# Patient Record
Sex: Female | Born: 1985 | Race: Black or African American | Hispanic: No | Smoking: Never smoker
Health system: Southern US, Community
[De-identification: ages and names within clinical notes are randomized; demographics above are authoritative.]

## PROBLEM LIST (undated history)

## (undated) ENCOUNTER — Inpatient Hospital Stay (HOSPITAL_COMMUNITY): Payer: Self-pay

## (undated) DIAGNOSIS — K219 Gastro-esophageal reflux disease without esophagitis: Secondary | ICD-10-CM

## (undated) DIAGNOSIS — D649 Anemia, unspecified: Secondary | ICD-10-CM

## (undated) DIAGNOSIS — F909 Attention-deficit hyperactivity disorder, unspecified type: Secondary | ICD-10-CM

## (undated) DIAGNOSIS — D4989 Neoplasm of unspecified behavior of other specified sites: Secondary | ICD-10-CM

## (undated) DIAGNOSIS — K5090 Crohn's disease, unspecified, without complications: Secondary | ICD-10-CM

## (undated) DIAGNOSIS — D219 Benign neoplasm of connective and other soft tissue, unspecified: Secondary | ICD-10-CM

## (undated) DIAGNOSIS — O24419 Gestational diabetes mellitus in pregnancy, unspecified control: Secondary | ICD-10-CM

## (undated) DIAGNOSIS — F419 Anxiety disorder, unspecified: Secondary | ICD-10-CM

## (undated) DIAGNOSIS — Z8619 Personal history of other infectious and parasitic diseases: Secondary | ICD-10-CM

## (undated) HISTORY — PX: CARDIAC SURGERY: SHX584

## (undated) HISTORY — DX: Personal history of other infectious and parasitic diseases: Z86.19

---
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
## 2007-03-01 ENCOUNTER — Emergency Department (HOSPITAL_COMMUNITY): Admission: EM | Admit: 2007-03-01 | Discharge: 2007-03-01 | Payer: Self-pay | Admitting: Emergency Medicine

## 2007-03-02 ENCOUNTER — Observation Stay (HOSPITAL_COMMUNITY): Admission: EM | Admit: 2007-03-02 | Discharge: 2007-03-03 | Payer: Self-pay | Admitting: Emergency Medicine

## 2007-03-12 ENCOUNTER — Emergency Department (HOSPITAL_COMMUNITY): Admission: EM | Admit: 2007-03-12 | Discharge: 2007-03-12 | Payer: Self-pay | Admitting: Family Medicine

## 2007-12-21 ENCOUNTER — Emergency Department (HOSPITAL_COMMUNITY): Admission: EM | Admit: 2007-12-21 | Discharge: 2007-12-21 | Payer: Self-pay | Admitting: Emergency Medicine

## 2008-01-06 ENCOUNTER — Inpatient Hospital Stay (HOSPITAL_COMMUNITY): Admission: AD | Admit: 2008-01-06 | Discharge: 2008-01-06 | Payer: Self-pay | Admitting: Family Medicine

## 2008-02-18 ENCOUNTER — Inpatient Hospital Stay (HOSPITAL_COMMUNITY): Admission: AD | Admit: 2008-02-18 | Discharge: 2008-02-18 | Payer: Self-pay | Admitting: Gynecology

## 2008-03-14 ENCOUNTER — Ambulatory Visit (HOSPITAL_COMMUNITY): Admission: RE | Admit: 2008-03-14 | Discharge: 2008-03-14 | Payer: Self-pay | Admitting: Obstetrics & Gynecology

## 2008-03-25 ENCOUNTER — Encounter: Admission: RE | Admit: 2008-03-25 | Discharge: 2008-03-25 | Payer: Self-pay | Admitting: Obstetrics & Gynecology

## 2008-03-25 ENCOUNTER — Ambulatory Visit: Payer: Self-pay | Admitting: Obstetrics & Gynecology

## 2008-03-26 ENCOUNTER — Ambulatory Visit: Payer: Self-pay | Admitting: Gynecology

## 2008-04-01 ENCOUNTER — Ambulatory Visit: Payer: Self-pay | Admitting: Obstetrics & Gynecology

## 2008-05-09 ENCOUNTER — Encounter: Payer: Self-pay | Admitting: Family Medicine

## 2008-05-09 ENCOUNTER — Ambulatory Visit: Payer: Self-pay | Admitting: Obstetrics & Gynecology

## 2008-05-09 LAB — CONVERTED CEMR LAB
Hemoglobin: 12.6 g/dL (ref 12.0–15.0)
Platelets: 248 10*3/uL (ref 150–400)
RBC: 3.71 M/uL — ABNORMAL LOW (ref 3.87–5.11)
RDW: 13 % (ref 11.5–15.5)
WBC: 6.3 10*3/uL (ref 4.0–10.5)

## 2008-05-27 ENCOUNTER — Encounter: Admission: RE | Admit: 2008-05-27 | Discharge: 2008-05-27 | Payer: Self-pay | Admitting: Obstetrics & Gynecology

## 2008-05-27 ENCOUNTER — Ambulatory Visit: Payer: Self-pay | Admitting: Obstetrics & Gynecology

## 2008-05-28 ENCOUNTER — Ambulatory Visit (HOSPITAL_COMMUNITY): Admission: RE | Admit: 2008-05-28 | Discharge: 2008-05-28 | Payer: Self-pay | Admitting: Obstetrics & Gynecology

## 2008-05-28 ENCOUNTER — Encounter: Payer: Self-pay | Admitting: Family Medicine

## 2008-06-17 ENCOUNTER — Encounter: Payer: Self-pay | Admitting: Family Medicine

## 2008-06-17 ENCOUNTER — Ambulatory Visit: Payer: Self-pay | Admitting: Obstetrics & Gynecology

## 2008-06-26 ENCOUNTER — Ambulatory Visit: Payer: Self-pay | Admitting: Family

## 2008-06-26 ENCOUNTER — Observation Stay (HOSPITAL_COMMUNITY): Admission: AD | Admit: 2008-06-26 | Discharge: 2008-06-27 | Payer: Self-pay | Admitting: Obstetrics & Gynecology

## 2008-07-13 ENCOUNTER — Inpatient Hospital Stay (HOSPITAL_COMMUNITY): Admission: AD | Admit: 2008-07-13 | Discharge: 2008-07-15 | Payer: Self-pay | Admitting: Obstetrics & Gynecology

## 2008-07-13 ENCOUNTER — Ambulatory Visit: Payer: Self-pay | Admitting: Obstetrics and Gynecology

## 2008-10-15 ENCOUNTER — Emergency Department (HOSPITAL_COMMUNITY): Admission: EM | Admit: 2008-10-15 | Discharge: 2008-10-15 | Payer: Self-pay | Admitting: *Deleted

## 2009-06-12 ENCOUNTER — Observation Stay (HOSPITAL_COMMUNITY): Admission: EM | Admit: 2009-06-12 | Discharge: 2009-06-12 | Payer: Self-pay | Admitting: Emergency Medicine

## 2009-06-19 IMAGING — US US OB FOLLOW-UP
1 series · 14 of 28 positions shown · non-contrast
Comparison: none

OBSTETRICAL ULTRASOUND:
 This ultrasound exam was performed in the [HOSPITAL] Ultrasound Department.  The OB US report was generated in the AS system, and faxed to the ordering physician.  This report is also available in [REDACTED] PACS.

[Series 1: us ob re-eval · 14 of 48 slices shown]
[im 2/48]
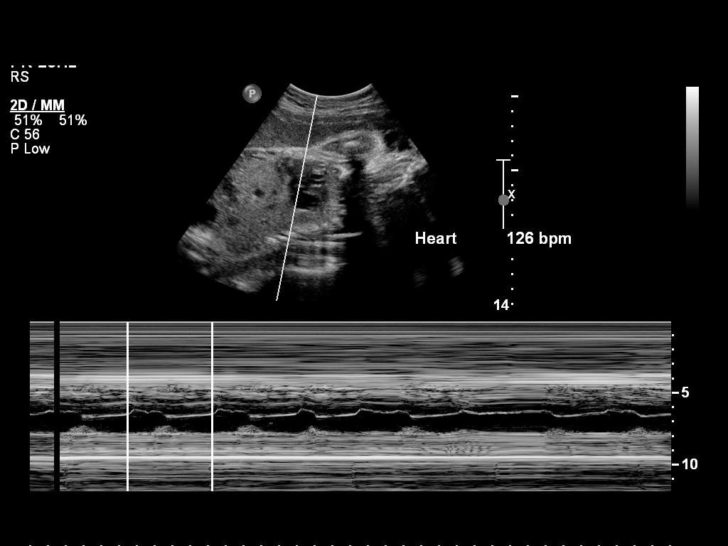
[im 6/48]
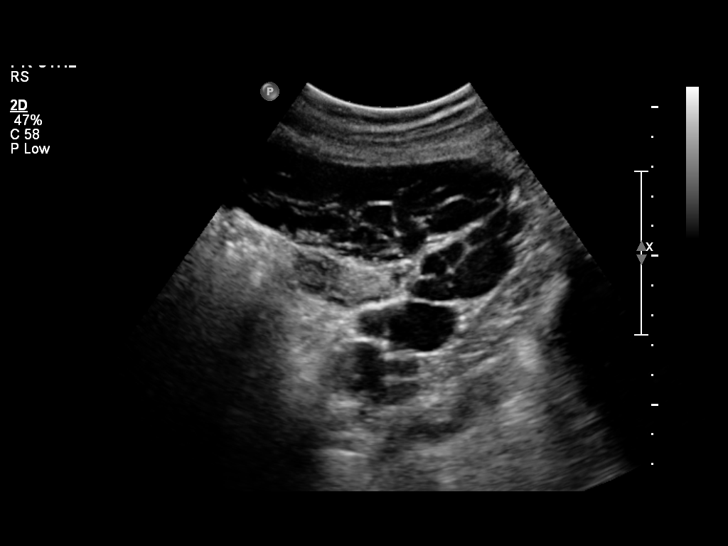
[im 9/48]
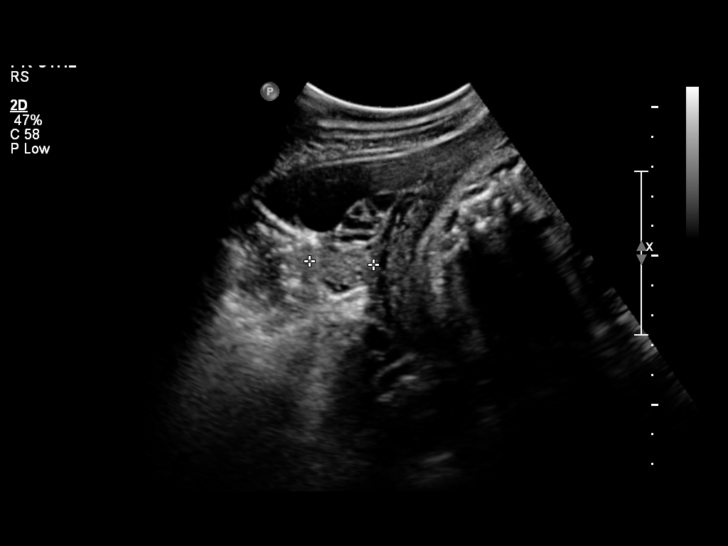
[im 13/48]
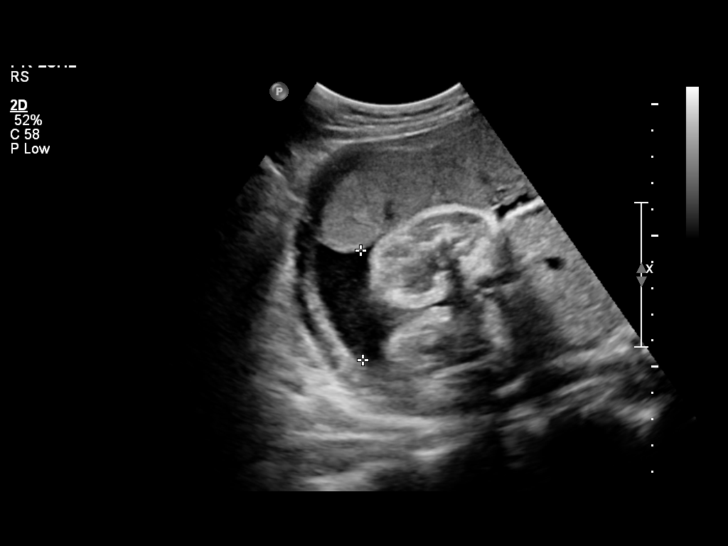
[im 16/48]
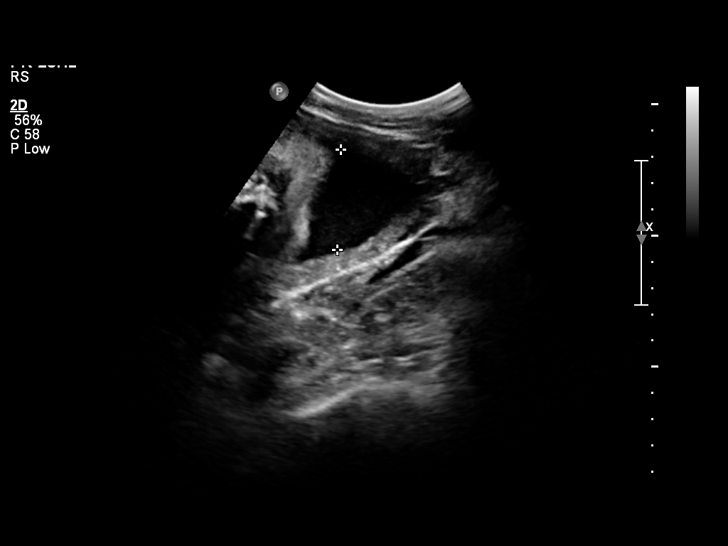
[im 20/48]
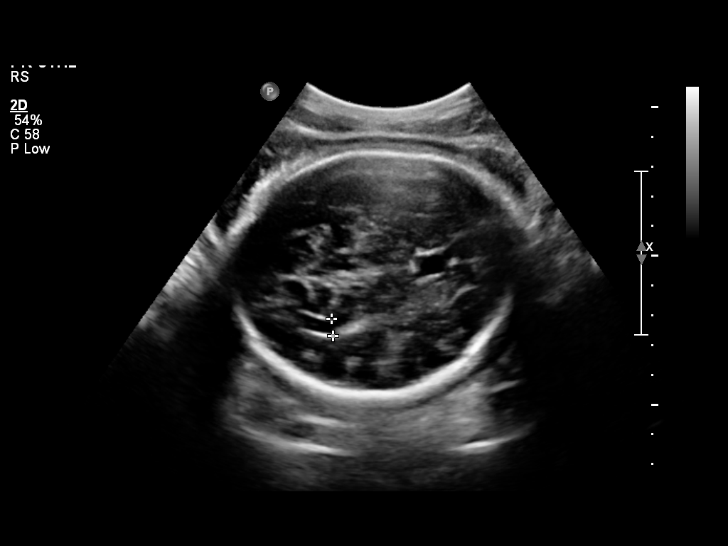
[im 23/48]
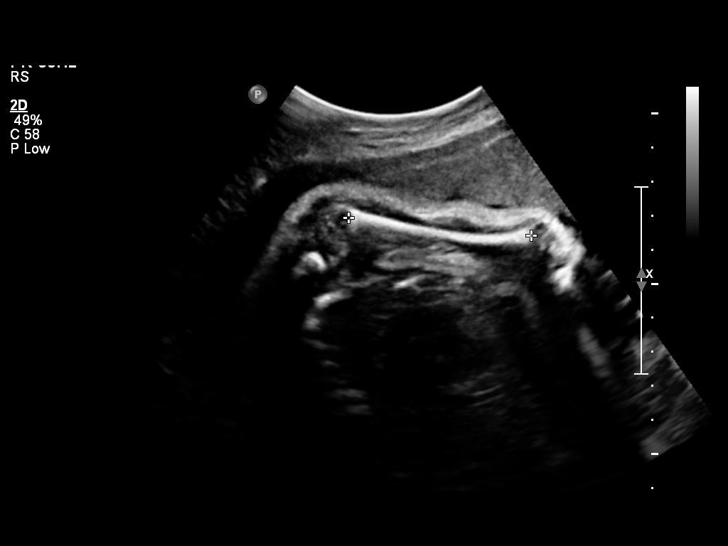
[im 27/48]
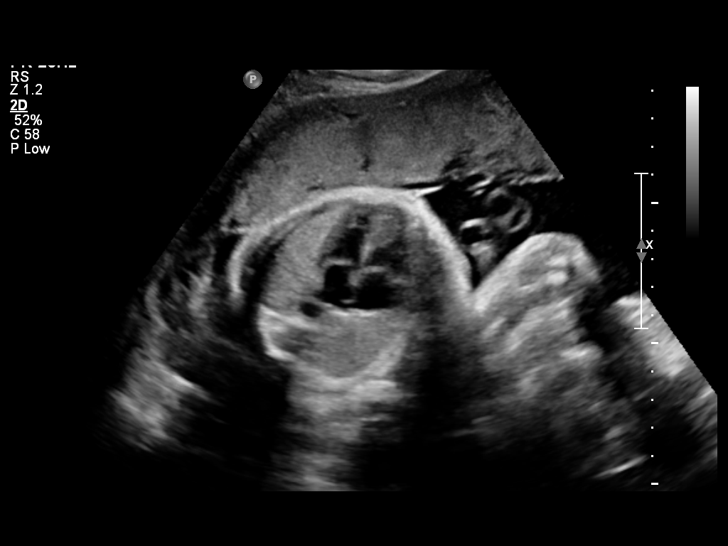
[im 30/48]
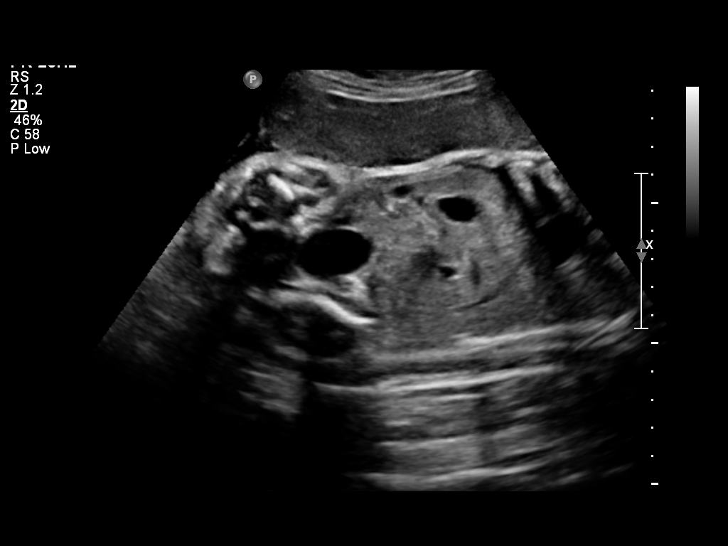
[im 34/48]
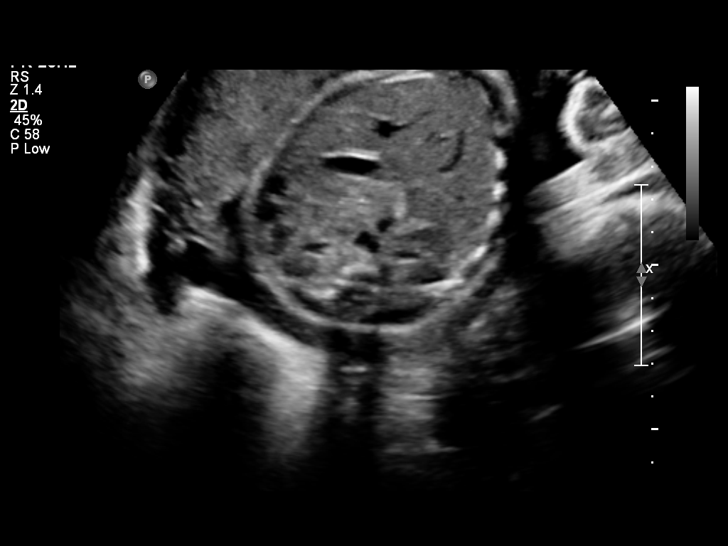
[im 37/48]
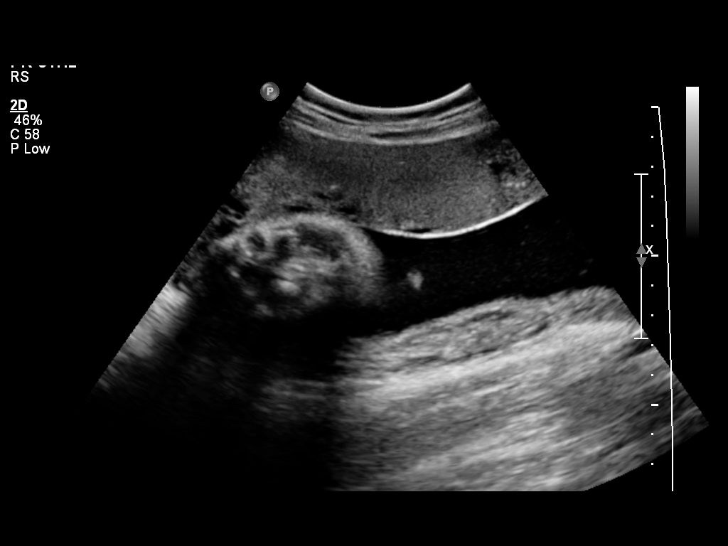
[im 41/48]
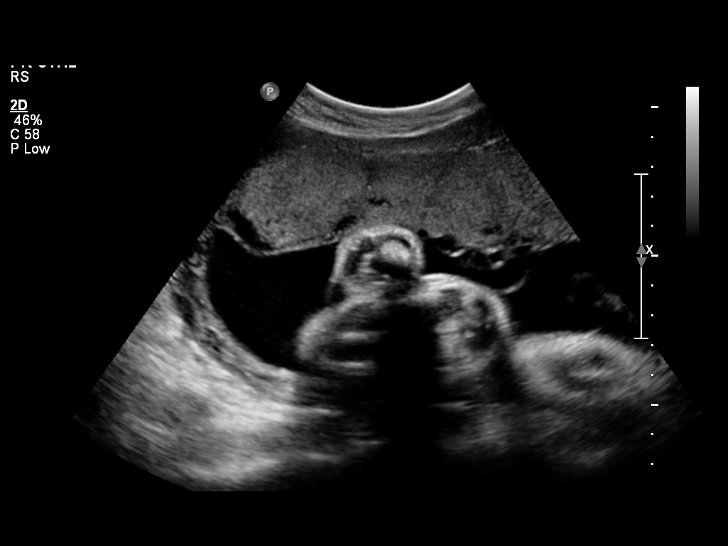
[im 44/48]
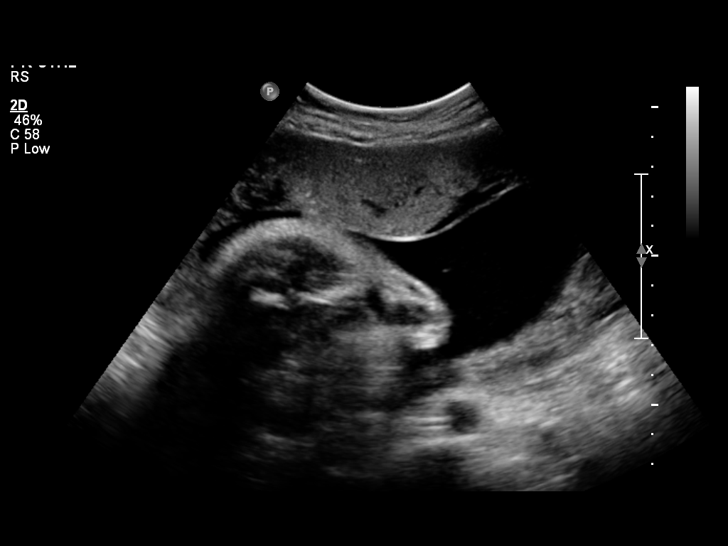
[im 48/48]
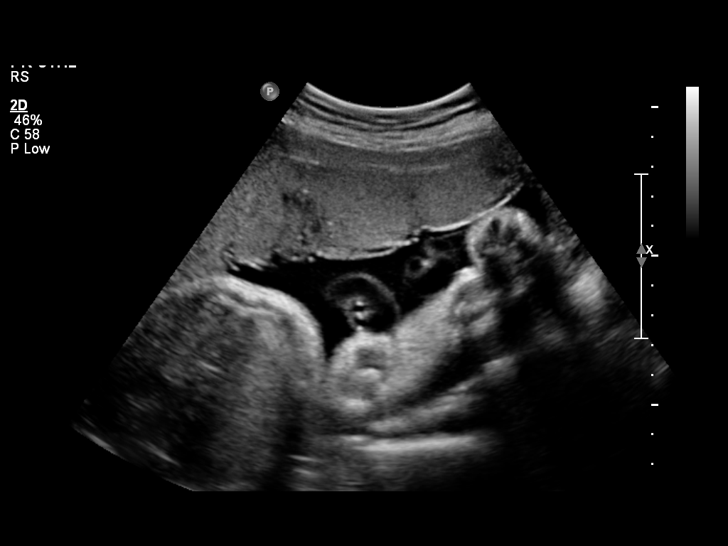

[14 of 28 positions shown; findings below may reference images not displayed]

IMPRESSION: See AS Obstetric US report.

## 2010-07-12 NOTE — L&D Delivery Note (Signed)
Delivery Note At 6:41 PM a viable and healthy female was delivered via Vaginal, Spontaneous Delivery (Presentation: Left Occiput Anterior).  APGAR: 9, 9; weight 7 lb 13.4 oz (3555 g).   Placenta status: , Spontaneous.  Cord: 3 vessels with the following complications: None.  Anesthesia: Epidural  Episiotomy: None Lacerations: None Suture Repair: none Est. Blood Loss (mL): 400  Mom to postpartum.  Baby to nursery-stable.  Melanie [name] D 04/16/2011, 7:05 PM

## 2010-08-02 ENCOUNTER — Encounter: Payer: Self-pay | Admitting: Obstetrics and Gynecology

## 2010-08-02 ENCOUNTER — Encounter: Payer: Self-pay | Admitting: *Deleted

## 2010-10-13 LAB — CBC
HCT: 40.7 % (ref 36.0–46.0)
Hemoglobin: 14 g/dL (ref 12.0–15.0)
MCHC: 34.3 g/dL (ref 30.0–36.0)
MCV: 93.9 fL (ref 78.0–100.0)
Platelets: 280 10*3/uL (ref 150–400)
RBC: 4.34 MIL/uL (ref 3.87–5.11)
RDW: 12.9 % (ref 11.5–15.5)
WBC: 9.2 10*3/uL (ref 4.0–10.5)

## 2010-10-13 LAB — URINALYSIS, ROUTINE W REFLEX MICROSCOPIC
Bilirubin Urine: NEGATIVE
Glucose, UA: NEGATIVE mg/dL
Specific Gravity, Urine: 1.021 (ref 1.005–1.030)

## 2010-10-13 LAB — POCT PREGNANCY, URINE: Preg Test, Ur: NEGATIVE

## 2010-10-13 LAB — DIFFERENTIAL
Eosinophils Relative: 1 % (ref 0–5)
Lymphs Abs: 2.3 10*3/uL (ref 0.7–4.0)
Monocytes Absolute: 0.4 10*3/uL (ref 0.1–1.0)
Monocytes Relative: 4 % (ref 3–12)
Neutro Abs: 6.4 10*3/uL (ref 1.7–7.7)

## 2010-10-13 LAB — COMPREHENSIVE METABOLIC PANEL
AST: 26 U/L (ref 0–37)
CO2: 20 mEq/L (ref 19–32)
Creatinine, Ser: 0.81 mg/dL (ref 0.4–1.2)
Glucose, Bld: 187 mg/dL — ABNORMAL HIGH (ref 70–99)
Potassium: 3.2 mEq/L — ABNORMAL LOW (ref 3.5–5.1)

## 2010-10-21 LAB — DIFFERENTIAL
Basophils Absolute: 0.1 10*3/uL (ref 0.0–0.1)
Eosinophils Relative: 0 % (ref 0–5)
Lymphocytes Relative: 13 % (ref 12–46)
Monocytes Absolute: 0.7 10*3/uL (ref 0.1–1.0)

## 2010-10-21 LAB — CBC
MCV: 91.7 fL (ref 78.0–100.0)
Platelets: 262 10*3/uL (ref 150–400)
WBC: 12.7 10*3/uL — ABNORMAL HIGH (ref 4.0–10.5)

## 2010-10-21 LAB — URINALYSIS, ROUTINE W REFLEX MICROSCOPIC
Nitrite: NEGATIVE
Specific Gravity, Urine: 1.025 (ref 1.005–1.030)
Urobilinogen, UA: 1 mg/dL (ref 0.0–1.0)

## 2010-10-21 LAB — PREGNANCY, URINE: Preg Test, Ur: NEGATIVE

## 2010-10-21 LAB — COMPREHENSIVE METABOLIC PANEL
AST: 29 U/L (ref 0–37)
Albumin: 4 g/dL (ref 3.5–5.2)
Chloride: 108 mEq/L (ref 96–112)
Creatinine, Ser: 0.82 mg/dL (ref 0.4–1.2)
GFR calc Af Amer: >60 mL/min (ref >60)
Potassium: 3.1 mEq/L — ABNORMAL LOW (ref 3.5–5.1)
Total Bilirubin: 0.8 mg/dL (ref 0.3–1.2)

## 2010-10-26 LAB — GLUCOSE, CAPILLARY
Glucose-Capillary: 110 mg/dL — ABNORMAL HIGH (ref 70–99)
Glucose-Capillary: 189 mg/dL — ABNORMAL HIGH (ref 70–99)
Glucose-Capillary: 191 mg/dL — ABNORMAL HIGH (ref 70–99)
Glucose-Capillary: 234 mg/dL — ABNORMAL HIGH (ref 70–99)

## 2010-10-26 LAB — CBC
HCT: 38.5 % (ref 36.0–46.0)
Hemoglobin: 12.9 g/dL (ref 12.0–15.0)
MCHC: 33.6 g/dL (ref 30.0–36.0)
MCV: 99.4 fL (ref 78.0–100.0)
Platelets: 226 10*3/uL (ref 150–400)
RBC: 3.88 MIL/uL (ref 3.87–5.11)
RDW: 13.4 % (ref 11.5–15.5)
WBC: 10.4 10*3/uL (ref 4.0–10.5)

## 2010-10-27 LAB — HEPATITIS B SURFACE ANTIGEN: Hepatitis B Surface Ag: NEGATIVE

## 2010-10-27 LAB — RUBELLA ANTIBODY, IGM: Rubella: IMMUNE

## 2010-10-27 LAB — RPR: RPR: NONREACTIVE

## 2010-10-27 LAB — ANTIBODY SCREEN: Antibody Screen: NEGATIVE

## 2010-10-27 LAB — ABO/RH: RH Type: POSITIVE

## 2010-10-27 LAB — GC/CHLAMYDIA PROBE AMP, GENITAL: Chlamydia: NEGATIVE

## 2010-11-24 NOTE — Discharge Summary (Signed)
Melanie [name], Melanie [name]            ACCOUNT NO.:  [id number]   MEDICAL RECORD NO.:  [id number]          PATIENT TYPE:  INP   LOCATION:  9304                          FACILITY:  [facility]   PHYSICIAN:  Norton Blizzard, M.D.    DATE OF BIRTH:  June 26, 1986   DATE OF ADMISSION:  12/06/2006  DATE OF DISCHARGE:  12/09/2006                               DISCHARGE SUMMARY   DISCHARGE DIAGNOSES:  1. Endometritis.  2. Diabetes mellitus.  3. History of atrial tumor and Cushing's disease.   CONSULTS:  1. Infectious disease.  2. Social work.   PROCEDURES:  1. CT of the abdomen and pelvis, contrast, which was read as      pyelonephritis involving the right kidney though the patient did      not have any evidence of infection, on urinalysis, nor did she have      any flank pain.  Cholelithiasis, though no evidence for acute      cholecystitis.  Moderate free fluid in the cul-de-sac, small amount      of fluid in the endometrial canal.  2. Chest x-ray no acute finding.   HOSPITAL COURSE BY PROBLEM:  Problem #1:  ENDOMETRITIS.  The patient was  admitted with a fever up to 103 during admission, and left-and-right  lower quadrant abdominal pain and tenderness.  She did not have any  breast pain and was not breast-feeding.  The urinalysis did not show any  signs of infection, no evidence of DVT; and patient's incision from  laceration was without signs of infection either.  We were concerned  about possible ovarian venous thrombosis.  With the patient having a  negative CT scan for any signs pointing to what could be causing her  fever, she was then placed on heparin.  On admission, it was felt that  endometritis was the most likely culprit; so she was started on  ampicillin, gentamicin, and clindamycin.  Gonorrhea and Chlamydia  cultures were negative.  Both cultures had no growth to date.  Chest x-  ray was negative for an infiltrate.  ID was consulted with the patient  having a fever of unknown origin  and it was felt that the most likely  cause was endometritis.  The patient was afebrile, 48 hours leading up  to discharge while also being changed to Zosyn by infectious disease.   Problem #2:  DIABETES MELLITUS.  The patient was continued on her home  regimen:  Humalog 5 units subcu b.i.d. and Humulin N 14 units subcu  b.i.d.  CBGs were checked q.a.m. and two hours postprandial at breakfast  and dinner, fastings were 112, 114, and 130.  I felt that there was no  need to adjust her medications at this time.  She will keep her normal  follow up as an outpatient for her diabetes.   DISCHARGE LABS:  Urine culture no growth.  Blood culture is no growth to  date x2.  White blood cell count 7.4, hemoglobin 12.3, platelets 305.  Gonorrhea negative.  Chlamydia negative.  Urine drug screen negative.   FOLLOWUP:  The patient to keep her normal  follow up at six weeks  postpartum; and any other follow up that she has for diabetes.   DISCHARGE MEDICATIONS:  None.   DISCHARGE CONDITION:  Good, stable.           ______________________________  Norton Blizzard, M.D.     SH/MEDQ  D:  12/09/2006  T:  12/09/2006  Job:  [id number]

## 2010-11-24 NOTE — H&P (Signed)
[name], Melanie [name]            ACCOUNT NO.:  [id number]   MEDICAL RECORD NO.:  [id number]            PATIENT TYPE:   LOCATION:                                 FACILITY:   PHYSICIAN:  Lonia Blood, M.D.       DATE OF BIRTH:  Jun 05, 1986   DATE OF ADMISSION:  03/02/2007  DATE OF DISCHARGE:                              HISTORY & PHYSICAL   PRIMARY CARE PHYSICIAN:  Tera Mater. [place], M.D.   CHIEF COMPLAINT:  Low sugars.   HISTORY OF PRESENT ILLNESS:  Melanie [name] is an 25 year old woman who  gave birth to a healthy fetus about 3 months ago, who was treated for  gestational diabetes through the year of 2007 and 2008.  Postpartum she  apparently was instructed to continue using her insulin, but she said  that her CBGs have varied very widely from the 300s down to the 30s.  She had a couple of episodes of loss of consciousness over the past days  associated with low sugars.  Today, she was brought in by the ambulance  with a CBG of 30 and altered levels of consciousness.  While she was  observed in the emergency room her blood sugar went into the 40s.  The  patient does not seem to be in any acute distress.  She does seem to be  febrile; and does not seem to have a major acute illness.  She reports  that she snacks a lot, and when she goes on a binge-eating of snacks,  her CBGs run into the 300s.  She promptly corrects that with 15 units of  Humalog and also NPH.  She took a dose of 15 units of NPH of 6:00 p.m.  tonight.   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   MEDICATIONS:  Insulin Humulin and Humalog, although the dose is unclear.   ALLERGIES:  No known drug allergies.   SOCIAL HISTORY:  The patient lives with her mother and brother.  She is  a Civil engineer, contracting.   FAMILY HISTORY:  Positive for diabetes in the patient's father and  grandmother.   REVIEW OF SYSTEMS:  No chest pain.  No cough.   No abdominal pain, no  nausea, no vomiting.  Other systems as per HPI.   PHYSICAL EXAMINATION:  VITAL SIGNS:  Temperature 96.7, blood pressure  101/61, pulse 101, respirations 20, saturation 99% on room air.  GENERAL APPEARANCE:  Well-developed, well-nourished in no acute  distress.  She is alert and oriented to place, person, and time.  HEENT:  Head is normocephalic, atraumatic.  Eyes pupils equal, and round  reacting to light and accommodation.  Extraocular movements intact.  Throat clear.  NECK:  Supple.  No JVD.  No carotid bruits.  CHEST:  Clear without wheezing, rhonchi, or crackles.  HEART:  Regular rate and rhythm without murmurs, rubs, or gallop.  ABDOMEN:  Soft, nontender, nondistended, bowel sounds are present.  EXTREMITIES:  Lower Extremities have no edema.  SKIN:  Warm, dry without any suspicious  rashes.   LABORATORY VALUES:  At time of admission, there is a CBG of 30.   ASSESSMENT AND PLAN:  1. Hypoglycemia, most likely secondary to insulin.  I doubt that the      patient has a major acute life-threatening illness at this point in      time.  It seems that she probably will require education and      insulin adjustment.  We will place the patient on observation.      Check a hemoglobin A1c and try to reduce dose of NPH and sliding      scale insulin to see if that is going to help the patient.      Overnight we will keep the patient on D5 intravenously as to      prevent further hypoglycemia while she is asleep and she cannot      eat.  Endocrinology consultation will be obtained in the morning.      Lonia Blood, M.D.  Electronically Signed     SL/MEDQ  D:  03/02/2007  T:  03/03/2007  Job:  [id number]

## 2010-11-24 NOTE — Discharge Summary (Signed)
Melanie [name], [name]            ACCOUNT NO.:  [id number]   MEDICAL RECORD NO.:  [id number]          PATIENT TYPE:  OBV   LOCATION:  5727                         FACILITY:  [facility]   PHYSICIAN:  Elliot Cousin, M.D.    DATE OF BIRTH:  [birth year]   DATE OF ADMISSION:  03/02/2007  DATE OF DISCHARGE:  03/03/2007                               DISCHARGE SUMMARY   DISCHARGE DIAGNOSES:  1. Hypoglycemia.  2. History of gestational diabetes mellitus.  3. Probable underlying glucose intolerance /Type 2 diabetes mellitus.   DISCHARGE MEDICATIONS:  1. Discontinue Humulin N insulin.  2. Humalog sliding scale as follows, for capillary blood glucose of      150-200 take 4 units.  For a capillary blood glucose of 201-250      take 6 units.  For capillary blood glucose of 251-300 take 9 units.      For capillary blood glucose of 301-350 take 11 units.  For      capillary blood glucose of 351-400 take 13 units.   DISCHARGE DISPOSITION:  The patient was discharged to home in improved  and stable condition.  She was advised to follow up with either the  [facility] or the outpatient clinic at [facility] in 2-  4 weeks.  She was given information regarding telephone numbers for both  clinics.   CONSULTATIONS:  Diabetes coordinator   PROCEDURE PERFORMED:  None.   HISTORY OF PRESENT ILLNESS:  The patient is an 25 year old woman with a  past medical history significant for gestational diabetes mellitus, who  presented to the emergency department on March 02, 2007 with an altered  level of consciousness.  When the EMT evaluated the patient at home she  was found to have a capillary blood glucose of 30.  She was treated with  multiple ampules of D50 en route and in the emergency department;  however, her capillary blood glucose did not stay within normal limits.  The patient had reported that she had taken 15 units of Humalog and also  NPH the night of admission because she felt as if  her blood glucose was  elevated.   For additional details please see the dictated history and physical by  Dr. Lonia Blood.   HOSPITAL COURSE:  1. HYPOGLYCEMIA/history of gestational diabetes mellitus, now with      probable glucose intolerance versus Type 2 diabetes mellitus.  As      indicated above, the patient was treated with multiple ampules of      D50.  Her capillary blood glucose was monitored every 4 hours.  She      was started empirically on D5 normal saline for several hours      during the first 24-hour period.  A sliding scale insulin regimen      was started once her capillary blood glucose reached greater than      150.  NPH was initially started as well; however, it was      discontinued because of the patient's low-to-low normal capillary      blood sugars.   Over the course of the  hospitalization the patient's capillary blood  glucose normalized, ranging from 113-121 fasting.  At times her  capillary blood sugar reached 132-170 postprandially.  Given these  findings, the patient was advised to continue only sliding scale Humalog  after hospital discharge.  She was advised to discontinue the NPH  indefinitely.  Her hemoglobin A-1-C was assessed and found to be 5.1,  quite normal.  The patient may have underlying glucose intolerance  versus mild Type 2 diabetes mellitus.  Obviously, now that she is 4  months postpartum her glucose control is much better.  The diabetes  coordinator was consulted and provided diabetes education with regards  to her meals, glucose monitoring and insulin therapy.  The  patient  voiced moderate understanding of the instructions given by the dictating  physician and the diabetes coordinator.   I called Dr. Rinaldo Cloud office and was informed that the patient was  dismissed from his practice because of too many missed appointments.  Therefore the patient was given information regarding how to make an  appointment with the [facility] clinic  or the outpatient clinic at  [facility].  The patient voiced understanding.  She was advised  to follow up with either clinic in 2-4 weeks.      Elliot Cousin, M.D.  Electronically Signed     DF/MEDQ  D:  03/03/2007  T:  03/05/2007  Job:  [id number]   cc:   Melvern Banker  [facility]

## 2010-11-27 NOTE — H&P (Signed)
NAMESHELSY, SENG NO.:  [id number]   MEDICAL RECORD NO.:  [id number]          PATIENT TYPE:  OBV   LOCATION:  9158                          FACILITY:  [facility]   PHYSICIAN:  Osborn Coho, M.D.   DATE OF BIRTH:  Nov 18, 1985   DATE OF ADMISSION:  10/13/2006  DATE OF DISCHARGE:                              HISTORY & PHYSICAL   Ms. Romeo Apple is an 25 year old, single, black female primigravida at 24  weeks, who was sent to [facility] from the [facility]  office after having a routine prenatal visit.  The patient has not kept  her prenatal appointments since January of 2008.  From October 2007  until January of 2008, her pregnancy has been followed by the [facility] OB/GYN MD service and has been remarkable for:  1)  Questionable last menstrual period/irregular cycles; 2)  IDDM; 3)  Cushing's syndrome; 4)  History of heart surgery secondary to tumor  removal.  Her prenatal labs were collected on April 22, 2006:  Hemoglobin 12.8, hematocrit 39.0, platelets 320,000, blood type O-  positive, antibody negative, RPR nonreactive, Rubella immune, hepatitis  B surface antigen negative, cystic fibrosis within normal limits.  The  patient presented for care at [facility] on April 22, 2006, at 7  and 6/7th weeks gestation with an [place] of Dec 03, 2006, determined by  ultrasonography at that first visit.  She was a newly diagnosed diabetic  diagnosed around the beginning of October 2007, and was hospitalized on  April 16, 2006, by Dr. Jarold Motto for blood sugar control and diabetic  education.  Dr. Evlyn Kanner was managing her blood sugars after her  hospitalization in the first trimester.  The patient had intended to do  first trimester screening but missed the appointment.  At her 13-week  visit, her Pap smear from June 2007, was reviewed showing LGSIL with  colpo in the plan to be scheduled.  The patient did not bring her CBG  log book at her 13 and  17 week visits.  She had colposcopy at 17 weeks.  She also had a normal quad screen.   OBSTETRICAL HISTORY:  She is a primigravida.   PAST MEDICAL HISTORY:  1. She has no medication allergies.  2. She experienced menarche at the age of 59 with irregular cycles.  3. She has used the patch in the past for contraception.  4. She reports having had the usual childhood illnesses.  5. She had open heart surgery at 25 years of age for a tumor removal.  6. She is a newly diagnosed diabetic.   PAST SURGICAL HISTORY:  As stated above with the open heart surgery.   FAMILY MEDICAL HISTORY:  Mother with the same type of heart issue as the  patient, paternal grandmother with hypertension, paternal grandmother  also with diabetes.   GENETIC HISTORY:  Negative.   SOCIAL HISTORY:  The patient is single.  Father of the baby's name is  Alycia Rossetti.  The patient is of the [place] faith.  She is a Ambulance person.  Father of the baby is a high  school graduate and is  employed full time as a Financial risk analyst at [employer].  They deny any alcohol, tobacco,  or illicit drug use with this pregnancy.   OBJECTIVE DATA:  VITAL SIGNS:  Stable.  The patient is afebrile.  HEENT:  Grossly within normal limits.  CHEST:  Clear to auscultation.  HEART:  Regular rate and rhythm.  ABDOMEN:  Gravid and contour with fundal height extending approximately  33 cm above the pubic symphysis.  Fetal heart rate is reassuring.  Contractions are irregular.  PELVIC EXAM:  Deferred.  EXTREMITIES:  Normal.   Ultrasound from the [facility] OB/GYN office today showed BPP 8  out of 8, abdominal circumference less than the second percentile but  was otherwise within normal limits.  Her CBG upon arrival to [facility] was 45.   ASSESSMENT:  1. Intrauterine pregnancy at 33 weeks.  2. Limited prenatal care.  3. Insulin-dependent diabetes mellitus with poor control.   PLAN:  1. Admit for 23-hour observation.  2. Obtain  hemoglobin A1c and TSH.  3. Attempt tighter blood sugar control.  4. MD's will follow.      Cam Hai, C.N.M.    ______________________________  Osborn Coho, M.D.    KS/MEDQ  D:  10/13/2006  T:  10/13/2006  Job:  [id number]

## 2010-11-27 NOTE — H&P (Signed)
NAMEBEXLEE, BERGDOLL            ACCOUNT NO.:  [id number]   MEDICAL RECORD NO.:  [id number]          PATIENT TYPE:  INP   LOCATION:  5036                         FACILITY:  [facility]   PHYSICIAN:  Barry Dienes. Eloise Harman, M.D.DATE OF BIRTH:  05-Nov-1985   DATE OF ADMISSION:  04/16/2006  DATE OF DISCHARGE:                                HISTORY & PHYSICAL   CHIEF COMPLAINT:  Severe hyperglycemia.   HISTORY OF PRESENT ILLNESS:  Patient is a 25 year old black female who  recently found out that she is pregnant.  Her last menstrual period began on  February 15, 2006.  She is estimated to be slightly over eight weeks pregnant.  Today she presented to the [facility] emergency room with a complaint  of worsening mild-to-moderate crampy pelvic pain over the past two weeks.  She also complains of polyuria, polydipsia but denies weight loss, fever,  chills, dysuria, frequency, constipation, nausea, or vomiting.   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   MEDICATIONS PRIOR TO ADMISSION:  None.   ALLERGIES:  No known drug allergies.   PAST SURGICAL HISTORY:  In 2004, removal of tumor near her heart at [facility].   SOCIAL HISTORY:  She lives with her mother and 74 year old brother.  Her  father passed away at age 38.  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   FAMILY HISTORY:  Mother is age 19 and has Cushing's disease.  She also had a  cardiac tumor with an embolism that resulted in left eye blindness.  She has  had bilateral adrenalectomies.  Her father died at age 70 of complications  of cirrhosis.  He also had diabetes mellitus type 2.  A grandmother also has  diabetes mellitus and a grandfather had colon cancer.  There is no family  history of premature coronary artery disease or breast cancer.   REVIEW OF SYSTEMS:  She has not had shortness of breath, chest pain,  palpitations, anxiety, or depression.  She has no  history of street drug  abuse or alcohol use.  She also has no history of tobacco abuse.   PHYSICAL EXAMINATION:  VITAL SIGNS:  Blood pressure 129/74, pulse 80,  respirations 18, temperature 97.6.  Pulse oximetry saturation 98% on room  air.  Initial capillary blood glucose level was 544 and currently is 371.  GENERAL:  She is a mildly overweight black female who is in no apparent  distress.  HEENT:  Within normal limits.  NECK:  Supple and without thyromegaly or jugular venous distention.  CHEST:  Clear to auscultation.  HEART:  Regular rate and rhythm without significant murmur or gallop.  ABDOMEN:  Normal bowel sounds.  No hepatosplenomegaly or tenderness.  There  was mild left lower quadrant/pelvic tenderness without rebound.  EXTREMITIES:  Without clubbing, cyanosis or edema.   INITIAL LABORATORY STUDIES:  White blood cell count 11.7, hemoglobin 13,  hematocrit 38, platelets 286.  Initial capillary blood glucose level 544.  Urinalysis was ketones negative.   [facility] ultrasound [facility] at [facility] reportedly  showed a normal-  appearing intrauterine pregnancy.   IMPRESSION/PLAN:  1. Hyperglycemia:  She has a new diagnosis of gestational diabetes      mellitus.  It is unclear if she has significant pancreatic insulin      production, although her ketone negative urinalysis would suggest that      she has adequate insulin secretion.  Overnight, we will use a      glucomander to rapidly normalize her blood glucose levels and get an      accurate estimate of her insulin needs.  She will have intense diabetes      education and transition over to subcutaneous insulin later today.  It      was emphasized to her the extreme importance of tight blood glucose      control during pregnancy.  She and her mother voiced an understanding      of this importance.  2. Pregnancy:  This is a new diagnosis.  We will start multivitamins with      iron and folate.  We will also check a TSH  level.  [facility]      checked routine gynecologic labs.           ______________________________  Barry Dienes Eloise Harman, M.D.     DGP/MEDQ  D:  04/16/2006  T:  04/17/2006  Job:  [id number]   cc:   Jeannett Senior A. Evlyn Kanner, M.D.

## 2010-11-27 NOTE — Discharge Summary (Signed)
Melanie [name]            ACCOUNT NO.:  [id number]   MEDICAL RECORD NO.:  [id number]          PATIENT TYPE:  INP   LOCATION:  5036                         FACILITY:  [facility]   PHYSICIAN:  Tera Mater. Evlyn Kanner, M.D. DATE OF BIRTH:  1985/10/19   DATE OF ADMISSION:  04/16/2006  DATE OF DISCHARGE:  04/18/2006                                 DISCHARGE SUMMARY   DISCHARGE DIAGNOSES:  1. 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  2. Newly discovered pregnancy, approximately 6-8 weeks' gestation, very      high risk.  3. History of atrial myxoma resection.  4. Mild hyponatremia.   Melanie [name] is a 25 year old black female seen by me last week for nausea and  dizziness.  A workup was in progress for multiple endocrine disorders.  It  was noted that her pregnancy test was positive that we had done because of  amenorrhea.  She was contacted on Friday about this and presented on  Saturday to Dr. Jarold Motto with another new problem of severe hyperglycemia.  She actually presented to [facility] first, and was then transferred  over here in the middle of the night.  Blood sugar was in excess of 500.  Clearly she had new onset diabetes.  She was treated initially with IV  Glucommander and now has been transitioned to injectable insulin.  She has  successfully given herself several injections and feels comfortable with  this.  She has had a nutrition assessment and had initial basic nutritional  needs reviewed on 10/7.  Her blood sugar numbers have stabilized somewhat.  Her volume deficits have been resolved.  Extensive outpatient education now  is to commence.  Whether or not she is a type 1 or type 2 diabetic is not  clear at the moment.  Either way, during pregnancy oral agents are basically  contraindicated and will not be used.  The other controversial issue is  whether or not to use Lantus insulin in the setting of pregnancy.  We are  going to have her see our educator today in  our office and probably  transition her to NPH with NovoLog.  Her GI complaints have resolved, her  nausea is resolved and she is feeling better.   LAB REVIEW:  White count at presentation 11700, hemoglobin 13.0, MCV 91.5,  platelets 286 thousand.  Initial chemistry:  Sodium 129, potassium 4.9,  chloride 97, CO2 was 23, BUN 20, creatinine 0.9, glucose was 459.  Repeat  chemistries yesterday:  Sodium 132, potassium 4.2, chloride 103, CO2 was 26,  BUN 14, creatinine 0.8, glucose 259.  CBG this morning was 243.  Liver  testing was normal with SGOT of 11, SGPT 16, total protein 6.6, albumin 3.3,  calcium was 8.7, hemoglobin A1c was clearly abnormal at 8.3%, TSH was normal  at 0.464, quantitative hCG was 16606.  A urine was negative for ketones,  positive for glucose.  Apparently an ultrasound was done during her women's  hospital evaluation, suggesting a 6-week pregnancy, per the record, 6 weeks  6 days, fetal heart rate of 120 beats per minute.  There was also  a luteal  cyst noted as well.  This is a single intrauterine pregnancy.  Crown-rump  length was 8 mm.   In summary, we have a complex situation with a young woman with new onset  diabetes in the setting of newly discovered pregnancy as well.  This is an  extremely high risk pregnancy with a high chance of fetal demise,  malformation, and difficulties.  The diabetes apparently has been  preexisting for at least the last few weeks during the formational period of  her pregnancy with regard to organs.  At the moment she is suboptimally  controlled, but we are in an artifical hospital environment.  We are going  to undertake extensive training through our office with  our CDE Pharm D, Dr. Hyacinth Meeker, and additionally probably transition her to NPH  and NovoLog.  Folic acid will be added routinely.  Extensive dietary  education will be undertaken.  We will try to get her into the high-risk OB  clinic within the next 2-4 days.  All of this was  discussed with the patient  and her mother.           ______________________________  Tera Mater. Evlyn Kanner, M.D.     SAS/MEDQ  D:  04/18/2006  T:  04/18/2006  Job:  [id number]

## 2010-11-27 NOTE — Discharge Summary (Signed)
Melanie [name], [name]            ACCOUNT NO.:  [id number]   MEDICAL RECORD NO.:  [id number]          PATIENT TYPE:  OBV   LOCATION:  9158                          FACILITY:  [facility]   PHYSICIAN:  Janine Limbo, M.D.DATE OF BIRTH:  July 06, 1986   DATE OF ADMISSION:  10/13/2006  DATE OF DISCHARGE:  10/14/2006                               DISCHARGE SUMMARY   ADMITTING DIAGNOSES:  1. Intrauterine pregnancy at 33 weeks.  2. Limited prenatal care  3. Insulin dependent diabetes with poor control.   DISCHARGE DIAGNOSES:  1. Intrauterine pregnancy at 33 weeks.  2. Limited prenatal care  3. Insulin dependent diabetes with poor control.   PROCEDURES:  None.   HOSPITAL COURSE:  Melanie [name] is a 25 year old gravida 1, para 0 at 64  weeks, who was sent to [facility] from [facility]  after having a routine prenatal visit.  However, the patient had not  kept her prenatal appointment since January 2008.  She began here at  [facility] in October 2007, but had visit beginning at  approximately 9-10 weeks.  She kept visit fairly well through late  December, early January, at which time she had not been seen again.   Just with the onset of diagnosis of her pregnancy, she had been  diagnosed with diabetes.  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  She had been followed by Dr. Evlyn Kanner.  She also has a history of an abnormal Pap in June 2007.  She also had  Chlamydia at that time.  Colposcopy was done at 17 weeks and test to  cure was also done at that time.   At the time of her admission she was doing blood sugars somewhat  intermittently at home.  She was using 18 units of NPH in the morning  and 5 units at supper.  On presentation, her blood sugar was 45.  She  did have a BPP, 8 out of 8, with lagging abdominal circumference but  otherwise within normal limits.  Fetal heart  rate was reactive with no  decelerations.  CBGs were monitored.  Hemoglobin A1c and TSH were done.  Glycosylated hemoglobin was 5.2.  TSH was 0.843.  CBC was within normal  limits.  Rapid HIV was nonreactive.  And RPR was nonreactive.   By the morning of October 14, 2006, the patient was doing fairly well.  She  had a low CBG during the evening with a blood sugar of 46-53.  This was  corrected with a snack.  On the morning of October 14, 2006, her fasting  blood sugar was 77.  Her physical exam was within normal limits.  Dr.  Stefano Gaul discussed with the patient issues of her limited prenatal care.  She did advise that she would be able to be more compliant with her  prenatal care if she could come to the hospital to have that done.  Dr.  Stefano Gaul did consult with the high risk clinic and the decision was made  to transfer her  to the clinic.  An appointment was made on October 24, 2006 for the patient at 8 a.m.   While the patient was in the hospital, her NSTs remained normal.  Blood  sugars were within normal limits.  She was placed on NPH 18 units in the  morning and Humalog 5 units at lunch, and 5 units Humalog at supper.  This seemed to provide better coverage for her.   Nutrition assessment and consultation was done with dietary instruction  to the patient.  As the afternoon progressed, the patient continued to  do well.  She did wish to go home.  So, the decision was made to  discharge for home.   DISCHARGE INSTRUCTIONS:  1. The patient had fetal kick counts, blood sugar management, and      preterm labor management reviewed with her.  2. She will check her CBGs at fasting and 2-hour p.c.   DISCHARGE MEDICATIONS:  1. NPH 18 units in the morning.  2. Humalog 5 units at lunch and Humalog 5 units at supper.  3. She will continue prenatal vitamins one p.o. daily.   DISCHARGE FOLLOWUP:  Will occur this coming week at [facility]  for a visit and an NST.  And, then the patient  has an appointment to be  transferred to the high risk clinic on October 24, 2006, at 8 a.m.  The  patient seems to understand this plan and is agreeable with it.  She is  therefore discharged home.      Renaldo Reel Emilee Hero, C.N.M.      Janine Limbo, M.D.  Electronically Signed    VLL/MEDQ  D:  10/14/2006  T:  10/14/2006  Job:  [id number]

## 2010-11-28 ENCOUNTER — Inpatient Hospital Stay (HOSPITAL_COMMUNITY)
Admission: AD | Admit: 2010-11-28 | Discharge: 2010-11-28 | Disposition: A | Discharge status: Home / Self Care | Payer: Self-pay | Source: Ambulatory Visit | Attending: Obstetrics and Gynecology | Admitting: Obstetrics and Gynecology

## 2010-11-28 DIAGNOSIS — O209 Hemorrhage in early pregnancy, unspecified: Secondary | ICD-10-CM | POA: Insufficient documentation

## 2011-03-03 ENCOUNTER — Ambulatory Visit: Payer: Self-pay

## 2011-03-10 ENCOUNTER — Encounter: Payer: Medicaid Other | Attending: Obstetrics and Gynecology

## 2011-03-31 ENCOUNTER — Ambulatory Visit: Payer: Medicaid Other | Admitting: Dietician

## 2011-04-05 ENCOUNTER — Ambulatory Visit: Payer: Medicaid Other | Admitting: *Deleted

## 2011-04-08 ENCOUNTER — Encounter: Payer: Self-pay | Admitting: *Deleted

## 2011-04-08 ENCOUNTER — Encounter: Payer: Medicaid Other | Attending: Obstetrics and Gynecology | Admitting: *Deleted

## 2011-04-08 DIAGNOSIS — O99810 Abnormal glucose complicating pregnancy: Secondary | ICD-10-CM | POA: Insufficient documentation

## 2011-04-08 DIAGNOSIS — Z713 Dietary counseling and surveillance: Secondary | ICD-10-CM | POA: Insufficient documentation

## 2011-04-08 LAB — URINALYSIS, ROUTINE W REFLEX MICROSCOPIC
Bilirubin Urine: NEGATIVE
Ketones, ur: NEGATIVE
Nitrite: NEGATIVE
Urobilinogen, UA: 0.2

## 2011-04-08 LAB — WET PREP, GENITAL: Clue Cells Wet Prep HPF POC: NONE SEEN

## 2011-04-08 LAB — GC/CHLAMYDIA PROBE AMP, GENITAL: GC Probe Amp, Genital: NEGATIVE

## 2011-04-08 LAB — URINE CULTURE: Colony Count: 75000

## 2011-04-08 NOTE — Progress Notes (Signed)
  Patient was seen on 04/08/2011 for Gestational Diabetes self-management appointment at [facility]. The following learning objectives were met by the patient during this visit:   States the definition of Gestational Diabetes  States why dietary management is important in controlling blood glucose  Describes the effects each nutrient has on blood glucose levels  Demonstrates ability to create a balanced meal plan  Demonstrates carbohydrate counting   States when to check blood glucose levels  Demonstrates proper blood glucose monitoring techniques  States the effect of stress and exercise on blood glucose levels  States the importance of limiting caffeine and abstaining from alcohol and smoking  Blood glucose monitor given:Accu-Chek Nano BG Monitoring System   Lot # [id number] Exp: 07/11/2012 Blood glucose reading:  102 mg/dl  Patient instructed to monitor glucose levels: FBS: 60 - <90 1 hour: <140  *Patient received handouts:  Nutrition Diabetes and Pregnancy  Carbohydrate Counting List  Patient instructed to report BG log info to MD for assessment and will be seen here for follow-up as needed.

## 2011-04-12 LAB — POCT URINALYSIS DIP (DEVICE)
Bilirubin Urine: NEGATIVE
Bilirubin Urine: NEGATIVE
Glucose, UA: 100 — AB
Glucose, UA: NEGATIVE
Hgb urine dipstick: NEGATIVE
Hgb urine dipstick: NEGATIVE
Nitrite: NEGATIVE
Nitrite: NEGATIVE
Operator id: 120861
Operator id: 6502296
Specific Gravity, Urine: 1.02
Urobilinogen, UA: 0.2

## 2011-04-13 LAB — POCT URINALYSIS DIP (DEVICE)
Bilirubin Urine: NEGATIVE
Glucose, UA: NEGATIVE
Hgb urine dipstick: NEGATIVE
Nitrite: NEGATIVE
Operator id: 297281
Urobilinogen, UA: 0.2

## 2011-04-14 LAB — POCT URINALYSIS DIP (DEVICE)
Bilirubin Urine: NEGATIVE
Glucose, UA: NEGATIVE
Nitrite: NEGATIVE
Operator id: 194561
Urobilinogen, UA: 0.2

## 2011-04-14 LAB — GLUCOSE, CAPILLARY: Glucose-Capillary: 93

## 2011-04-16 ENCOUNTER — Inpatient Hospital Stay (HOSPITAL_COMMUNITY)
Admission: AD | Admit: 2011-04-16 | Discharge: 2011-04-18 | DRG: 775 | Disposition: A | Discharge status: Home / Self Care | Payer: Medicaid Other | Source: Ambulatory Visit | Attending: Obstetrics and Gynecology | Admitting: Obstetrics and Gynecology

## 2011-04-16 ENCOUNTER — Encounter (HOSPITAL_COMMUNITY): Payer: Self-pay | Admitting: Anesthesiology

## 2011-04-16 ENCOUNTER — Encounter (HOSPITAL_COMMUNITY): Payer: Self-pay | Admitting: *Deleted

## 2011-04-16 ENCOUNTER — Encounter (HOSPITAL_COMMUNITY): Payer: Self-pay | Admitting: Obstetrics

## 2011-04-16 ENCOUNTER — Inpatient Hospital Stay (HOSPITAL_COMMUNITY): Payer: Medicaid Other | Admitting: Anesthesiology

## 2011-04-16 DIAGNOSIS — O4290 Premature rupture of membranes, unspecified as to length of time between rupture and onset of labor, unspecified weeks of gestation: Principal | ICD-10-CM | POA: Diagnosis present

## 2011-04-16 DIAGNOSIS — O42919 Preterm premature rupture of membranes, unspecified as to length of time between rupture and onset of labor, unspecified trimester: Secondary | ICD-10-CM | POA: Diagnosis present

## 2011-04-16 DIAGNOSIS — O99814 Abnormal glucose complicating childbirth: Secondary | ICD-10-CM | POA: Diagnosis present

## 2011-04-16 DIAGNOSIS — O24419 Gestational diabetes mellitus in pregnancy, unspecified control: Secondary | ICD-10-CM | POA: Diagnosis present

## 2011-04-16 HISTORY — DX: Gestational diabetes mellitus in pregnancy, unspecified control: O24.419

## 2011-04-16 HISTORY — DX: Anemia, unspecified: D64.9

## 2011-04-16 LAB — POCT URINALYSIS DIP (DEVICE)
Bilirubin Urine: NEGATIVE
Glucose, UA: NEGATIVE mg/dL
Specific Gravity, Urine: 1.01 (ref 1.005–1.030)

## 2011-04-16 LAB — GLUCOSE, CAPILLARY
Glucose-Capillary: 127 mg/dL — ABNORMAL HIGH (ref 70–99)
Glucose-Capillary: 114 mg/dL — ABNORMAL HIGH (ref 70–99)
Glucose-Capillary: 119 mg/dL — ABNORMAL HIGH (ref 70–99)
Glucose-Capillary: 106 mg/dL — ABNORMAL HIGH (ref 70–99)
Glucose-Capillary: 238 mg/dL — ABNORMAL HIGH (ref 70–99)

## 2011-04-16 LAB — CBC
HCT: 36.5 % (ref 36.0–46.0)
MCV: 99.6 fL (ref 78.0–100.0)
Platelets: 212 10*3/uL (ref 150–400)
RBC: 3.67 MIL/uL — ABNORMAL LOW (ref 3.87–5.11)
RDW: 13.9 % (ref 11.5–15.5)
WBC: 10.1 10*3/uL (ref 4.0–10.5)
WBC: 6.8 10*3/uL (ref 4.0–10.5)

## 2011-04-16 LAB — RPR: RPR Ser Ql: NONREACTIVE

## 2011-04-16 MED ORDER — DEXTROSE-NACL 5-0.45 % IV SOLN
INTRAVENOUS | Status: DC
  Administered 2011-04-16: 12:00:00 via INTRAVENOUS

## 2011-04-16 MED ORDER — CITRIC ACID-SODIUM CITRATE 334-500 MG/5ML PO SOLN: 30.0000 mL | ORAL | Status: DC | PRN

## 2011-04-16 MED ORDER — METHYLERGONOVINE MALEATE 0.2 MG/ML IJ SOLN: 0.2000 mg | INTRAMUSCULAR | Status: DC | PRN

## 2011-04-16 MED ORDER — SODIUM CHLORIDE 0.9 % IV SOLN
INTRAVENOUS | Status: DC
  Administered 2011-04-16: 12:00:00 via INTRAVENOUS

## 2011-04-16 MED ORDER — TERBUTALINE SULFATE 1 MG/ML IJ SOLN: 0.2500 mg | Freq: Once | INTRAMUSCULAR | Status: DC | PRN

## 2011-04-16 MED ORDER — ONDANSETRON HCL 4 MG PO TABS: 4.0000 mg | ORAL_TABLET | ORAL | Status: DC | PRN

## 2011-04-16 MED ORDER — DIBUCAINE 1 % RE OINT: 1.0000 "application " | TOPICAL_OINTMENT | RECTAL | Status: DC | PRN

## 2011-04-16 MED ORDER — PHENYLEPHRINE 40 MCG/ML (10ML) SYRINGE FOR IV PUSH (FOR BLOOD PRESSURE SUPPORT)
80.0000 ug | PREFILLED_SYRINGE | INTRAVENOUS | Status: DC | PRN
  Filled 2011-04-16 (×2): qty 5

## 2011-04-16 MED ORDER — TETANUS-DIPHTH-ACELL PERTUSSIS 5-2.5-18.5 LF-MCG/0.5 IM SUSP
0.5000 mL | Freq: Once | INTRAMUSCULAR | Status: AC
  Administered 2011-04-17: 0.5 mL via INTRAMUSCULAR
  Filled 2011-04-16: qty 0.5

## 2011-04-16 MED ORDER — LIDOCAINE HCL 1.5 % IJ SOLN
INTRAMUSCULAR | Status: DC | PRN
  Administered 2011-04-16 (×2): 4 mL via EPIDURAL

## 2011-04-16 MED ORDER — OXYCODONE-ACETAMINOPHEN 5-325 MG PO TABS: 2.0000 | ORAL_TABLET | ORAL | Status: DC | PRN

## 2011-04-16 MED ORDER — WITCH HAZEL-GLYCERIN EX PADS: 1.0000 "application " | MEDICATED_PAD | CUTANEOUS | Status: DC | PRN

## 2011-04-16 MED ORDER — SODIUM CHLORIDE 0.9 % IV SOLN
INTRAVENOUS | Status: DC
  Administered 2011-04-16: 1.7 [IU]/h via INTRAVENOUS
  Administered 2011-04-16: 2.4 [IU]/h via INTRAVENOUS
  Administered 2011-04-16: 2.3 [IU]/h via INTRAVENOUS
  Administered 2011-04-16: 1.8 [IU]/h via INTRAVENOUS
  Filled 2011-04-16: qty 1

## 2011-04-16 MED ORDER — DIPHENHYDRAMINE HCL 50 MG/ML IJ SOLN: 12.5000 mg | INTRAMUSCULAR | Status: DC | PRN

## 2011-04-16 MED ORDER — OXYTOCIN BOLUS FROM INFUSION
500.0000 mL | Freq: Once | INTRAVENOUS | Status: DC
  Filled 2011-04-16: qty 1000
  Filled 2011-04-16: qty 500

## 2011-04-16 MED ORDER — DEXTROSE 5 % IV SOLN
5.0000 10*6.[IU] | Freq: Once | INTRAVENOUS | Status: DC
  Administered 2011-04-16: 5 10*6.[IU] via INTRAVENOUS
  Filled 2011-04-16: qty 5

## 2011-04-16 MED ORDER — LIDOCAINE HCL (PF) 1 % IJ SOLN
30.0000 mL | INTRAMUSCULAR | Status: DC | PRN
  Filled 2011-04-16: qty 30

## 2011-04-16 MED ORDER — PHENYLEPHRINE 40 MCG/ML (10ML) SYRINGE FOR IV PUSH (FOR BLOOD PRESSURE SUPPORT)
80.0000 ug | PREFILLED_SYRINGE | INTRAVENOUS | Status: DC | PRN
  Filled 2011-04-16: qty 5

## 2011-04-16 MED ORDER — OXYTOCIN 20 UNITS IN LACTATED RINGERS INFUSION - SIMPLE: 125.0000 mL/h | Freq: Once | INTRAVENOUS | Status: DC

## 2011-04-16 MED ORDER — EPHEDRINE 5 MG/ML INJ
10.0000 mg | INTRAVENOUS | Status: DC | PRN
  Filled 2011-04-16 (×2): qty 4

## 2011-04-16 MED ORDER — LACTATED RINGERS IV SOLN: 500.0000 mL | Freq: Once | INTRAVENOUS | Status: DC

## 2011-04-16 MED ORDER — BENZOCAINE-MENTHOL 20-0.5 % EX AERO
INHALATION_SPRAY | CUTANEOUS | Status: AC
  Filled 2011-04-16: qty 56

## 2011-04-16 MED ORDER — BENZOCAINE-MENTHOL 20-0.5 % EX AERO
1.0000 "application " | INHALATION_SPRAY | CUTANEOUS | Status: DC | PRN
  Administered 2011-04-16: 1 via TOPICAL

## 2011-04-16 MED ORDER — DIPHENHYDRAMINE HCL 25 MG PO CAPS: 25.0000 mg | ORAL_CAPSULE | Freq: Four times a day (QID) | ORAL | Status: DC | PRN

## 2011-04-16 MED ORDER — FENTANYL 2.5 MCG/ML BUPIVACAINE 1/10 % EPIDURAL INFUSION (WH - ANES)
14.0000 mL/h | INTRAMUSCULAR | Status: DC
  Administered 2011-04-16: 14 mL/h via EPIDURAL
  Filled 2011-04-16 (×2): qty 60

## 2011-04-16 MED ORDER — ONDANSETRON HCL 4 MG/2ML IJ SOLN: 4.0000 mg | Freq: Four times a day (QID) | INTRAMUSCULAR | Status: DC | PRN

## 2011-04-16 MED ORDER — LACTATED RINGERS IV SOLN
500.0000 mL | INTRAVENOUS | Status: DC | PRN
  Administered 2011-04-16: 500 mL via INTRAVENOUS

## 2011-04-16 MED ORDER — ZOLPIDEM TARTRATE 5 MG PO TABS: 5.0000 mg | ORAL_TABLET | Freq: Every evening | ORAL | Status: DC | PRN

## 2011-04-16 MED ORDER — LANOLIN HYDROUS EX OINT: TOPICAL_OINTMENT | CUTANEOUS | Status: DC | PRN

## 2011-04-16 MED ORDER — OXYTOCIN 20 UNITS IN LACTATED RINGERS INFUSION - SIMPLE
1.0000 m[IU]/min | INTRAVENOUS | Status: DC
  Administered 2011-04-16: 11 m[IU]/min via INTRAVENOUS
  Administered 2011-04-16: 4 m[IU]/min via INTRAVENOUS
  Administered 2011-04-16: 333 m[IU]/min via INTRAVENOUS
  Administered 2011-04-16: 1 m[IU]/min via INTRAVENOUS
  Filled 2011-04-16: qty 1000

## 2011-04-16 MED ORDER — ONDANSETRON HCL 4 MG/2ML IJ SOLN: 4.0000 mg | INTRAMUSCULAR | Status: DC | PRN

## 2011-04-16 MED ORDER — IBUPROFEN 600 MG PO TABS: 600.0000 mg | ORAL_TABLET | Freq: Four times a day (QID) | ORAL | Status: DC | PRN

## 2011-04-16 MED ORDER — INSULIN REGULAR HUMAN 100 UNIT/ML IJ SOLN
5.0000 [IU] | Freq: Once | INTRAMUSCULAR | Status: AC
  Administered 2011-04-16: 5 [IU] via SUBCUTANEOUS
  Filled 2011-04-16: qty 0.05

## 2011-04-16 MED ORDER — COMPLETENATE 29-1 MG PO CHEW
1.0000 | CHEWABLE_TABLET | Freq: Every day | ORAL | Status: DC
  Administered 2011-04-17 – 2011-04-18 (×2): 1 via ORAL
  Filled 2011-04-16 (×3): qty 1

## 2011-04-16 MED ORDER — METHYLERGONOVINE MALEATE 0.2 MG PO TABS: 0.2000 mg | ORAL_TABLET | ORAL | Status: DC | PRN

## 2011-04-16 MED ORDER — PRENATAL PLUS 27-1 MG PO TABS: 1.0000 | ORAL_TABLET | Freq: Every day | ORAL | Status: DC

## 2011-04-16 MED ORDER — IBUPROFEN 600 MG PO TABS
600.0000 mg | ORAL_TABLET | Freq: Four times a day (QID) | ORAL | Status: DC
  Administered 2011-04-16 – 2011-04-18 (×7): 600 mg via ORAL
  Filled 2011-04-16 (×7): qty 1

## 2011-04-16 MED ORDER — MEASLES, MUMPS & RUBELLA VAC ~~LOC~~ INJ: 0.5000 mL | INJECTION | Freq: Once | SUBCUTANEOUS | Status: DC

## 2011-04-16 MED ORDER — FENTANYL 2.5 MCG/ML BUPIVACAINE 1/10 % EPIDURAL INFUSION (WH - ANES)
INTRAMUSCULAR | Status: DC | PRN
  Administered 2011-04-16: 14 mL/h via EPIDURAL

## 2011-04-16 MED ORDER — PENICILLIN G POTASSIUM 5000000 UNITS IJ SOLR
2.5000 10*6.[IU] | INTRAVENOUS | Status: DC
  Filled 2011-04-16: qty 2.5

## 2011-04-16 MED ORDER — MAGNESIUM HYDROXIDE 400 MG/5ML PO SUSP: 30.0000 mL | ORAL | Status: DC | PRN

## 2011-04-16 MED ORDER — OXYCODONE-ACETAMINOPHEN 5-325 MG PO TABS
1.0000 | ORAL_TABLET | ORAL | Status: DC | PRN
  Administered 2011-04-17 – 2011-04-18 (×3): 2 via ORAL
  Filled 2011-04-16 (×3): qty 2

## 2011-04-16 MED ORDER — SENNOSIDES-DOCUSATE SODIUM 8.6-50 MG PO TABS
2.0000 | ORAL_TABLET | Freq: Every day | ORAL | Status: DC
  Administered 2011-04-16 – 2011-04-17 (×2): 2 via ORAL

## 2011-04-16 MED ORDER — EPHEDRINE 5 MG/ML INJ
10.0000 mg | INTRAVENOUS | Status: DC | PRN
  Filled 2011-04-16: qty 4

## 2011-04-16 MED ORDER — ACETAMINOPHEN 325 MG PO TABS: 650.0000 mg | ORAL_TABLET | ORAL | Status: DC | PRN

## 2011-04-16 MED ORDER — SIMETHICONE 80 MG PO CHEW: 80.0000 mg | CHEWABLE_TABLET | ORAL | Status: DC | PRN

## 2011-04-16 MED ORDER — LACTATED RINGERS IV SOLN
INTRAVENOUS | Status: DC
  Administered 2011-04-16: 10:00:00 via INTRAVENOUS

## 2011-04-16 MED ORDER — OXYTOCIN 20 UNITS IN LACTATED RINGERS INFUSION - SIMPLE
125.0000 mL/h | INTRAVENOUS | Status: DC | PRN
  Filled 2011-04-16: qty 1000

## 2011-04-16 MED ORDER — DEXTROSE 50 % IV SOLN: 25.0000 mL | INTRAVENOUS | Status: DC | PRN

## 2011-04-16 NOTE — Anesthesia Preprocedure Evaluation (Addendum)
Anesthesia Evaluation  Name, MR# and DOB Patient awake  General Assessment Comment  Reviewed: Allergy & Precautions, H&P , Patient's Chart, lab work & pertinent test results  Airway Mallampati: III TM Distance: >3 FB Neck ROM: full    Dental No notable dental hx.    Pulmonary  clear to auscultation  Pulmonary exam normal       Cardiovascular regular Normal    Neuro/Psych Negative Neurological ROS  Negative Psych ROS   GI/Hepatic negative GI ROS Neg liver ROS    Endo/Other  Negative Endocrine ROSWell Controlled, Gestational, Insulin Dependent  Renal/GU negative Renal ROS  Genitourinary negative   Musculoskeletal   Abdominal   Peds  Hematology negative hematology ROS (+)   Anesthesia Other Findings   Reproductive/Obstetrics (+) Pregnancy                           Anesthesia Physical Anesthesia Plan  ASA: II  Anesthesia Plan: Epidural   Post-op Pain Management:    Induction:   Airway Management Planned:   Additional Equipment:   Intra-op Plan:   Post-operative Plan:   Informed Consent: I have reviewed the patients History and Physical, chart, labs and discussed the procedure including the risks, benefits and alternatives for the proposed anesthesia with the patient or authorized representative who has indicated his/her understanding and acceptance.     Plan Discussed with: Anesthesiologist  Anesthesia Plan Comments:         Anesthesia Quick Evaluation

## 2011-04-16 NOTE — H&P (Signed)
Melanie [name] is a 25 y.o. female presenting for leaking fluid.  Evaluation in MAU confirmed ROM.  Pt admitted and started on pitocin.  PNC complicated by significantly elevated GCT, pt had difficulty keeping appts with [facility], got in about a week ago, only checked sugars for one day, 2 of those were slightly elevated.  See prenatal records for complete history.  Maternal Medical History:  Reason for admission: Reason for admission: rupture of membranes.  Contractions: Frequency: irregular.    Fetal activity: Perceived fetal activity is normal.    Prenatal complications: GDM    OB History    Grav Para Term Preterm Abortions TAB SAB Ect Mult Living   3 2 2       2      Past Medical History  Diagnosis Date  . Anemia   . Diabetes mellitus   . Gestational diabetes    Past Surgical History  Procedure Date  . Cardiac surgery     at 25 years old   Family History: family history is not on file. Social History:  reports that she has been smoking.  She has never used smokeless tobacco. She reports that she uses illicit drugs (Marijuana) about once per week. She reports that she does not drink alcohol.  Review of Systems  Respiratory: Negative.   Cardiovascular: Negative.     Dilation: 3 Effacement (%): 70 Station: -3 Exam by:: Philena Obey Blood pressure 123/62, pulse 84, temperature 97.7 F (36.5 C), temperature source Oral, resp. rate 18, height 5\' 6"  (1.676 m), weight 78.019 kg (172 lb). Maternal Exam:  Uterine Assessment: Contraction strength is mild.  Contraction frequency is irregular.   Abdomen: Patient reports no abdominal tenderness. Estimated fetal weight is 6 lbs.   Fetal presentation: vertex  Introitus: Normal vulva. Normal vagina.    Fetal Exam Fetal Monitor Review: Mode: ultrasound.   Baseline rate: 140s.  Variability: moderate (6-25 bpm).   Pattern: accelerations present and early decelerations.    Fetal State Assessment: Category I - tracings are  normal.     Physical Exam  Constitutional: She appears well-developed and well-nourished.  Neck: Neck supple. No thyromegaly present.  Cardiovascular: Normal rate, regular rhythm and normal heart sounds.   No murmur heard. Respiratory: Effort normal and breath sounds normal. No respiratory distress.  GI: Soft.    Prenatal labs: ABO, Rh: O/Positive/-- (04/17 0000) Antibody: Negative (04/17 0000) Rubella: Immune (04/17 0000) RPR: Nonreactive (04/17 0000)  HBsAg: Negative (04/17 0000)  HIV: Non-reactive (04/17 0000)  GBS: Pending (09/26 0000) negative  Assessment/Plan: IUP at 36+ weeks with PPROM and gestational diabetes.  On pitocin for induction.  First sugar was 180, started on glucose stabilizer protocol, needing IV insulin.  Will monitor progress.     Ramar Nobrega D 04/16/2011, 1:12 PM

## 2011-04-16 NOTE — Progress Notes (Signed)
srom 0725, prior fast delivery.  (arrived by EMS)

## 2011-04-16 NOTE — Progress Notes (Signed)
Pt states srom at 0725, clear fluid, hx precipitous deliveries, denies pain at present, +FM, no bloody show noted.

## 2011-04-16 NOTE — Anesthesia Procedure Notes (Signed)
Epidural Patient location during procedure: OB Start time: 04/16/2011 4:05 PM  Staffing Anesthesiologist: Nyomi Howser A. Performed by: anesthesiologist   Preanesthetic Checklist Completed: patient identified, site marked, surgical consent, pre-op evaluation, timeout performed, IV checked, risks and benefits discussed and monitors and equipment checked  Epidural Patient position: sitting Prep: site prepped and draped and DuraPrep Patient monitoring: continuous pulse ox and blood pressure Approach: midline Injection technique: LOR air  Needle:  Needle type: Tuohy  Needle gauge: 17 G Needle length: 9 cm Needle insertion depth: 5 cm cm Catheter type: closed end flexible Catheter size: 19 Gauge Catheter at skin depth: 10 cm Test dose: negative and 1.5% lidocaine  Assessment Events: blood not aspirated, injection not painful, no injection resistance, negative IV test and no paresthesia  Additional Notes Patient is more comfortable after epidural dosed. Please see RN's note for documentation of vital signs and FHR which are stable.

## 2011-04-17 LAB — GLUCOSE, CAPILLARY
Glucose-Capillary: 152 mg/dL — ABNORMAL HIGH (ref 70–99)
Glucose-Capillary: 191 mg/dL — ABNORMAL HIGH (ref 70–99)

## 2011-04-17 MED ORDER — INFLUENZA VIRUS VACC SPLIT PF IM SUSP: 0.5000 mL | Freq: Once | INTRAMUSCULAR | Status: DC

## 2011-04-17 MED ORDER — INSULIN ASPART 100 UNIT/ML ~~LOC~~ SOLN
3.0000 [IU] | Freq: Once | SUBCUTANEOUS | Status: AC
  Administered 2011-04-17: 3 [IU] via SUBCUTANEOUS
  Filled 2011-04-17: qty 3

## 2011-04-17 MED ORDER — INFLUENZA VAC TYP A&B SURF ANT IM INJ
0.5000 mL | INJECTION | Freq: Once | INTRAMUSCULAR | Status: AC
  Administered 2011-04-17: 0.5 mL via INTRAMUSCULAR
  Filled 2011-04-17: qty 0.5

## 2011-04-17 NOTE — Progress Notes (Signed)
Dr. Jackelyn Knife notified of fasting blood sugar of 152.  No orders received.  Will continue to monitor blood sugars.

## 2011-04-17 NOTE — Anesthesia Postprocedure Evaluation (Signed)
  Anesthesia Post-op Note  Patient: Melanie [name]  Procedure(s) Performed: * No procedures listed *  Patient Location: PACU and Mother/Baby  Anesthesia Type: Epidural  Level of Consciousness: awake, alert  and oriented  Airway and Oxygen Therapy: Patient Spontanous Breathing  Post-op Pain: none  Post-op Assessment: Patient's Cardiovascular Status Stable, Respiratory Function Stable, Patent Airway, No signs of Nausea or vomiting and Adequate PO intake  Post-op Vital Signs: stable  Complications: No apparent anesthesia complications2

## 2011-04-17 NOTE — Progress Notes (Signed)
PPD#1 Afeb, VSS  CGB 150+ fasting, 250 after dinner last night-given 5 units Novolog Fundus-firm, NT at U-2 Continue routine postpartum care.  Will continue checking sugars for now and treat if greater than 200, may have nongestational diabetes.

## 2011-04-18 MED ORDER — OXYCODONE-ACETAMINOPHEN 5-325 MG PO TABS: 1.0000 | ORAL_TABLET | ORAL | Status: AC | PRN

## 2011-04-18 MED ORDER — IBUPROFEN 600 MG PO TABS: 600.0000 mg | ORAL_TABLET | Freq: Four times a day (QID) | ORAL | Status: AC

## 2011-04-18 NOTE — Progress Notes (Signed)
PPD#2 No problems, concerned about elevated sugars Afeb, VSS All CBG elevated, 150-200, received 3 units Novolog for 210+ after dinner, FBS 188 today Discussed again that she may have Type II DM, encouraged diabetic diet at home, discharge home

## 2011-04-18 NOTE — Progress Notes (Signed)
D/c instructions reviewed with pt. And s.o., both state understanding of same, no home equipment needed, ambulated to car with staff without incident, d/c'd home with family.

## 2011-04-18 NOTE — Discharge Summary (Signed)
Obstetric Discharge Summary Reason for Admission: PPROM at 36 weeks Prenatal Procedures: none Intrapartum Procedures: spontaneous vaginal delivery Postpartum Procedures: none Complications-Operative and Postpartum: none Hemoglobin  Date Value Range Status  04/16/2011 13.6  12.0-15.0 (g/dL) Final     HCT  Date Value Range Status  04/16/2011 41.5  36.0-46.0 (%) Final    Discharge Diagnoses: PPROM at 36 weeks, GDM  Discharge Information: Date: 04/18/2011 Activity: pelvic rest Diet: Diabetic diet Medications: Ibuprofen and Percocet Condition: stable Instructions: refer to practice specific booklet Discharge to: home Follow-up Information    Follow up with Shelisa Fern D, MD. Make an appointment in 6 weeks.   Contact information:   [address] [place] [address] [phone]          Newborn Data: Live born female  Birth Weight: 7 lb 13.4 oz (3555 g) APGAR: 9, 9  Home with mother.  Tyanna Hach D 04/18/2011, 10:00 AM

## 2011-04-20 NOTE — Progress Notes (Signed)
UR Chart review completed.  

## 2011-04-23 LAB — HEMOGLOBIN A1C: Mean Plasma Glucose: 104

## 2011-04-23 LAB — DIFFERENTIAL
Basophils Relative: 1
Eosinophils Absolute: 0
Eosinophils Relative: 1
Lymphs Abs: 2
Monocytes Relative: 8
Neutrophils Relative %: 66

## 2011-04-23 LAB — BASIC METABOLIC PANEL
BUN: 13
CO2: 27
Chloride: 108
Creatinine, Ser: 0.6
Potassium: 4

## 2011-04-23 LAB — CBC
HCT: 38.6
MCHC: 33.9
MCV: 92.4
Platelets: 354 — ABNORMAL HIGH
RBC: 4.18
WBC: 8.1

## 2012-01-02 ENCOUNTER — Encounter (HOSPITAL_COMMUNITY): Payer: Self-pay | Admitting: Emergency Medicine

## 2012-01-02 ENCOUNTER — Emergency Department (HOSPITAL_COMMUNITY)
Admission: EM | Admit: 2012-01-02 | Discharge: 2012-01-02 | Disposition: A | Discharge status: Home / Self Care | Payer: Medicaid Other | Attending: Emergency Medicine | Admitting: Emergency Medicine

## 2012-01-02 ENCOUNTER — Emergency Department (HOSPITAL_COMMUNITY): Payer: Medicaid Other

## 2012-01-02 DIAGNOSIS — F17200 Nicotine dependence, unspecified, uncomplicated: Secondary | ICD-10-CM | POA: Insufficient documentation

## 2012-01-02 DIAGNOSIS — E119 Type 2 diabetes mellitus without complications: Secondary | ICD-10-CM | POA: Insufficient documentation

## 2012-01-02 DIAGNOSIS — D649 Anemia, unspecified: Secondary | ICD-10-CM | POA: Insufficient documentation

## 2012-01-02 DIAGNOSIS — M549 Dorsalgia, unspecified: Secondary | ICD-10-CM | POA: Insufficient documentation

## 2012-01-02 MED ORDER — DIAZEPAM 5 MG PO TABS: 5.0000 mg | ORAL_TABLET | Freq: Two times a day (BID) | ORAL | Status: AC

## 2012-01-02 NOTE — ED Notes (Signed)
Patient advises that she fell through a floor two months ago a short distance. Since that time she advises that she has had lower middle back pain she describes the pain as a sharp burning pain in nature.

## 2012-01-02 NOTE — ED Provider Notes (Signed)
History     CSN: [id number]  Arrival date & time 01/02/12  0915   First MD Initiated Contact with Patient 01/02/12 [phone]      Chief Complaint  Patient presents with  . Back Pain    (Consider location/radiation/quality/duration/timing/severity/associated sxs/prior treatment) HPI Comments: Patient comes in today with a chief complaint of back pain.  She reports that she fell 1-2 months ago and has been having back pain ever since.  She landed on her back when she fell.  Pain located over the inferior portion of the thoracic spine and the superior portion of the lumbar spine.  No prior medical evaluation for this.  She has been taking Ibuprofen for the pain, which has helped somewhat.    Patient is a 26 y.o. female presenting with back pain. The history is provided by the patient.  Back Pain  Episode frequency: intermittent. The problem has not changed since onset.The pain is present in the thoracic spine and lumbar spine. Quality: sharp. The symptoms are aggravated by bending and twisting. Pertinent negatives include no fever, no numbness, no abdominal pain, no bowel incontinence, no perianal numbness, no bladder incontinence, no dysuria, no pelvic pain, no paresthesias, no paresis, no tingling and no weakness.    Past Medical History  Diagnosis Date  . Anemia   . Diabetes mellitus   . Gestational diabetes     Past Surgical History  Procedure Date  . Cardiac surgery     at 26 years old    No family history on file.  History  Substance Use Topics  . Smoking status: Current Some Day Smoker  . Smokeless tobacco: Never Used  . Alcohol Use: No    OB History    Grav Para Term Preterm Abortions TAB SAB Ect Mult Living   3 3 2 1      2       Review of Systems  Constitutional: Negative for fever and chills.  HENT: Negative for neck pain and neck stiffness.   Respiratory: Negative for shortness of breath.   Gastrointestinal: Negative for nausea, vomiting, abdominal pain and  bowel incontinence.  Genitourinary: Negative for bladder incontinence, dysuria and pelvic pain.       No urinary retention No bowel or bladder incontinence  Musculoskeletal: Positive for back pain. Negative for gait problem.  Skin: Negative for color change.  Neurological: Negative for tingling, weakness, numbness and paresthesias.    Allergies  Review of patient's allergies indicates no known allergies.  Home Medications   Current Outpatient Rx  Name Route Sig Dispense Refill  . IBUPROFEN 200 MG PO TABS Oral Take 400 mg by mouth every 6 (six) hours as needed. For pain    . COMPLETENATE 29-1 MG PO CHEW Oral Chew 1 tablet by mouth daily.        BP 145/88  Pulse 92  Temp 98.3 F (36.8 C) (Oral)  Resp 18  SpO2 100%  LMP 12/13/2011  Breastfeeding? No  Physical Exam  Nursing note and vitals reviewed. Constitutional: She appears well-developed and well-nourished. No distress.  HENT:  Head: Normocephalic and atraumatic.  Neck: Normal range of motion. Neck supple.  Cardiovascular: Normal rate, regular rhythm and normal heart sounds.   Pulmonary/Chest: Effort normal and breath sounds normal.  Musculoskeletal: Normal range of motion.       Thoracic back: She exhibits tenderness and bony tenderness. She exhibits normal range of motion, no swelling, no edema and no deformity.       Lumbar back:  She exhibits tenderness and bony tenderness. She exhibits normal range of motion, no swelling, no edema and no deformity.       Increased pain with ROM of the back.  Neurological: She is alert. She has normal strength. No sensory deficit. Gait normal.  Reflex Scores:      Patellar reflexes are 2+ on the right side and 2+ on the left side.      Achilles reflexes are 2+ on the right side and 2+ on the left side. Skin: Skin is warm and dry. She is not diaphoretic.  Psychiatric: She has a normal mood and affect.    ED Course  Procedures (including critical care time)  Labs Reviewed - No  data to display Dg Thoracic Spine 2 View  01/02/2012  *RADIOLOGY REPORT*  Clinical Data: Mid to low back pain after a fall 3 months ago, intermittently radiating into the right lower extremity.  THORACIC SPINE - 2 VIEW  Comparison: No prior thoracic spine imaging.  Two-view chest x-ray 12/06/2006.  Findings: 12 rib-bearing thoracic vertebrae with anatomic alignment.  Slight thoracic scoliosis convex right.  No fractures. Well-preserved disc spaces without evidence of spondylosis.  Intact pedicles.  IMPRESSION: No acute osseous abnormality.  Slight thoracic scoliosis convex right.  Original Report Authenticated By: Arnell Sieving, M.D.   Dg Lumbar Spine Complete  01/02/2012  *RADIOLOGY REPORT*  Clinical Data: Mid to low back pain after a fall 3 months ago, intermittently radiating into the right lower extremity.  LUMBAR SPINE - COMPLETE 4+ VIEW  Comparison: Bone window images from CT abdomen and pelvis 10/15/2008.  Thoracic spine images obtained concurrently were used for counting ribs.  Findings: 6 non-rib bearing lumbar vertebrae designated L1-L6 (thoracic imaging confirmed 12 rib-bearing thoracic vertebrae). Anatomic posterior alignment.  No fractures.  Well-preserved disc spaces.  No pars defects.  No significant facet arthropathy. Visualized sacroiliac joints intact.  Slight thoracolumbar scoliosis convex left.  IMPRESSION: No acute or significant abnormalities.  Six non-rib bearing lumbar vertebrae.  Slight thoracolumbar scoliosis convex left.  Original Report Authenticated By: Arnell Sieving, M.D.     1. Back pain       MDM  Patient with persistent back pain after a fall 1-2 months ago.  Negative xrays.  No neurological deficits and normal neuro exam.  Patient able to ambulate without difficulty.  No loss of bowel or bladder control.  No concern for cauda equina.  No fever, night sweats, weight loss, h/o cancer, IVDU.          Pascal Lux [place], PA-C 01/02/12 1932

## 2012-01-02 NOTE — Discharge Instructions (Signed)
Back Pain, Adult Low back pain is very common. About 1 in 5 people have back pain.The cause of low back pain is rarely dangerous. The pain often gets better over time.About half of people with a sudden onset of back pain feel better in just 2 weeks. About 8 in 10 people feel better by 6 weeks.  CAUSES Some common causes of back pain include:  Strain of the muscles or ligaments supporting the spine.   Wear and tear (degeneration) of the spinal discs.   Arthritis.   Direct injury to the back.  DIAGNOSIS Most of the time, the direct cause of low back pain is not known.However, back pain can be treated effectively even when the exact cause of the pain is unknown.Answering your caregiver's questions about your overall health and symptoms is one of the most accurate ways to make sure the cause of your pain is not dangerous. If your caregiver needs more information, he or she may order lab work or imaging tests (X-rays or MRIs).However, even if imaging tests show changes in your back, this usually does not require surgery. HOME CARE INSTRUCTIONS For many people, back pain returns.Since low back pain is rarely dangerous, it is often a condition that people can learn to manageon their own.   Remain active. It is stressful on the back to sit or stand in one place. Do not sit, drive, or stand in one place for more than 30 minutes at a time. Take short walks on level surfaces as soon as pain allows.Try to increase the length of time you walk each day.   Do not stay in bed.Resting more than 1 or 2 days can delay your recovery.   Do not avoid exercise or work.Your body is made to move.It is not dangerous to be active, even though your back may hurt.Your back will likely heal faster if you return to being active before your pain is gone.   Pay attention to your body when you bend and lift. Many people have less discomfortwhen lifting if they bend their knees, keep the load close to their  bodies,and avoid twisting. Often, the most comfortable positions are those that put less stress on your recovering back.   Find a comfortable position to sleep. Use a firm mattress and lie on your side with your knees slightly bent. If you lie on your back, put a pillow under your knees.   Only take over-the-counter or prescription medicines as directed by your caregiver. Over-the-counter medicines to reduce pain and inflammation are often the most helpful.Your caregiver may prescribe muscle relaxant drugs.These medicines help dull your pain so you can more quickly return to your normal activities and healthy exercise.   Put ice on the injured area.   Put ice in a plastic bag.   Place a towel between your skin and the bag.   Leave the ice on for 15 to 20 minutes, 3 to 4 times a day for the first 2 to 3 days. After that, ice and heat may be alternated to reduce pain and spasms.   Ask your caregiver about trying back exercises and gentle massage. This may be of some benefit.   Avoid feeling anxious or stressed.Stress increases muscle tension and can worsen back pain.It is important to recognize when you are anxious or stressed and learn ways to manage it.Exercise is a great option.  SEEK MEDICAL CARE IF:  You have pain that is not relieved with rest or medicine.   You have   pain that does not improve in 1 week.   You have new symptoms.   You are generally not feeling well.  SEEK IMMEDIATE MEDICAL CARE IF:   You have pain that radiates from your back into your legs.   You develop new bowel or bladder control problems.   You have unusual weakness or numbness in your arms or legs.   You develop nausea or vomiting.   You develop abdominal pain.   You feel faint.  Document Released: 06/28/2005 Document Revised: 06/17/2011 Document Reviewed: 11/16/2010 [employer] Patient Information 2012 [employer].    Back Exercises Back exercises help treat and prevent back injuries.  The goal of back exercises is to increase the strength of your abdominal and back muscles and the flexibility of your back. These exercises should be started when you no longer have back pain. Back exercises include:  Pelvic Tilt. Lie on your back with your knees bent. Tilt your pelvis until the lower part of your back is against the floor. Hold this position 5 to 10 sec and repeat 5 to 10 times.   Knee to Chest. Pull first 1 knee up against your chest and hold for 20 to 30 seconds, repeat this with the other knee, and then both knees. This may be done with the other leg straight or bent, whichever feels better.   Sit-Ups or Curl-Ups. Bend your knees 90 degrees. Start with tilting your pelvis, and do a partial, slow sit-up, lifting your trunk only 30 to 45 degrees off the floor. Take at least 2 to 3 seconds for each sit-up. Do not do sit-ups with your knees out straight. If partial sit-ups are difficult, simply do the above but with only tightening your abdominal muscles and holding it as directed.   Hip-Lift. Lie on your back with your knees flexed 90 degrees. Push down with your feet and shoulders as you raise your hips a couple inches off the floor; hold for 10 seconds, repeat 5 to 10 times.   Back arches. Lie on your stomach, propping yourself up on bent elbows. Slowly press on your hands, causing an arch in your low back. Repeat 3 to 5 times. Any initial stiffness and discomfort should lessen with repetition over time.   Shoulder-Lifts. Lie face down with arms beside your body. Keep hips and torso pressed to floor as you slowly lift your head and shoulders off the floor.  Do not overdo your exercises, especially in the beginning. Exercises may cause you some mild back discomfort which lasts for a few minutes; however, if the pain is more severe, or lasts for more than 15 minutes, do not continue exercises until you see your caregiver. Improvement with exercise therapy for back problems is slow.    See your caregivers for assistance with developing a proper back exercise program. Document Released: 08/05/2004 Document Revised: 06/17/2011 Document Reviewed: 06/28/2005 [employer] Patient Information 2012 [employer]. 

## 2012-01-02 NOTE — ED Notes (Signed)
Patient is being discharged with instructions and verbalizes an understanding. NAD noted at discharged

## 2012-01-02 NOTE — ED Provider Notes (Signed)
Medical screening examination/treatment/procedure(s) were performed by non-physician practitioner and as supervising physician I was immediately available for consultation/collaboration.   Carleene Cooper III, MD 01/02/12 2105

## 2012-06-13 ENCOUNTER — Encounter (HOSPITAL_COMMUNITY): Payer: Self-pay | Admitting: *Deleted

## 2012-06-13 ENCOUNTER — Observation Stay (HOSPITAL_COMMUNITY): Payer: Medicaid Other

## 2012-06-13 ENCOUNTER — Observation Stay (HOSPITAL_COMMUNITY)
Admission: EM | Admit: 2012-06-13 | Discharge: 2012-06-13 | Disposition: A | Discharge status: Home / Self Care | Payer: Medicaid Other | Attending: Emergency Medicine | Admitting: Emergency Medicine

## 2012-06-13 DIAGNOSIS — R197 Diarrhea, unspecified: Secondary | ICD-10-CM | POA: Insufficient documentation

## 2012-06-13 DIAGNOSIS — R1110 Vomiting, unspecified: Secondary | ICD-10-CM

## 2012-06-13 DIAGNOSIS — R1013 Epigastric pain: Secondary | ICD-10-CM | POA: Insufficient documentation

## 2012-06-13 DIAGNOSIS — R112 Nausea with vomiting, unspecified: Principal | ICD-10-CM | POA: Insufficient documentation

## 2012-06-13 DIAGNOSIS — K529 Noninfective gastroenteritis and colitis, unspecified: Secondary | ICD-10-CM

## 2012-06-13 LAB — COMPREHENSIVE METABOLIC PANEL
AST: 20 U/L (ref 0–37)
Albumin: 3.8 g/dL (ref 3.5–5.2)
Chloride: 103 mEq/L (ref 96–112)
Creatinine, Ser: 0.67 mg/dL (ref 0.50–1.10)
Potassium: 3.7 mEq/L (ref 3.5–5.1)
Sodium: 138 mEq/L (ref 135–145)
Total Bilirubin: 0.2 mg/dL — ABNORMAL LOW (ref 0.3–1.2)

## 2012-06-13 LAB — URINALYSIS, ROUTINE W REFLEX MICROSCOPIC
Glucose, UA: NEGATIVE mg/dL
Hgb urine dipstick: NEGATIVE
Leukocytes, UA: NEGATIVE
pH: 6 (ref 5.0–8.0)

## 2012-06-13 LAB — CBC
HCT: 36.4 % (ref 36.0–46.0)
MCV: 91.9 fL (ref 78.0–100.0)
RDW: 13.4 % (ref 11.5–15.5)
WBC: 12.9 10*3/uL — ABNORMAL HIGH (ref 4.0–10.5)

## 2012-06-13 LAB — POCT I-STAT, CHEM 8
BUN: 14 mg/dL (ref 6–23)
Chloride: 106 mEq/L (ref 96–112)
Creatinine, Ser: 0.9 mg/dL (ref 0.50–1.10)
Potassium: 3.9 mEq/L (ref 3.5–5.1)
Sodium: 139 mEq/L (ref 135–145)

## 2012-06-13 LAB — GLUCOSE, CAPILLARY: Glucose-Capillary: 139 mg/dL — ABNORMAL HIGH (ref 70–99)

## 2012-06-13 MED ORDER — CIPROFLOXACIN HCL 500 MG PO TABS: 500.0000 mg | ORAL_TABLET | Freq: Two times a day (BID) | ORAL | Status: DC

## 2012-06-13 MED ORDER — IOHEXOL 300 MG/ML  SOLN
80.0000 mL | Freq: Once | INTRAMUSCULAR | Status: AC | PRN
  Administered 2012-06-13: 80 mL via INTRAVENOUS

## 2012-06-13 MED ORDER — IOHEXOL 300 MG/ML  SOLN
20.0000 mL | INTRAMUSCULAR | Status: AC
  Administered 2012-06-13: 20 mL via ORAL

## 2012-06-13 MED ORDER — ONDANSETRON HCL 4 MG/2ML IJ SOLN
4.0000 mg | Freq: Once | INTRAMUSCULAR | Status: AC
  Administered 2012-06-13: 4 mg via INTRAVENOUS
  Filled 2012-06-13: qty 2

## 2012-06-13 MED ORDER — SODIUM CHLORIDE 0.9 % IV SOLN
1000.0000 mL | INTRAVENOUS | Status: DC
  Administered 2012-06-13 (×3): 1000 mL via INTRAVENOUS

## 2012-06-13 MED ORDER — SODIUM CHLORIDE 0.9 % IV BOLUS (SEPSIS)
1000.0000 mL | Freq: Once | INTRAVENOUS | Status: AC
  Administered 2012-06-13: 1000 mL via INTRAVENOUS

## 2012-06-13 MED ORDER — ONDANSETRON HCL 4 MG/2ML IJ SOLN
4.0000 mg | Freq: Four times a day (QID) | INTRAMUSCULAR | Status: DC | PRN
  Administered 2012-06-13 (×2): 4 mg via INTRAVENOUS
  Filled 2012-06-13 (×2): qty 2

## 2012-06-13 MED ORDER — PROMETHAZINE HCL 25 MG/ML IJ SOLN
25.0000 mg | Freq: Once | INTRAMUSCULAR | Status: AC
  Administered 2012-06-13: 25 mg via INTRAMUSCULAR
  Filled 2012-06-13: qty 1

## 2012-06-13 MED ORDER — FAMOTIDINE IN NACL 20-0.9 MG/50ML-% IV SOLN
20.0000 mg | Freq: Once | INTRAVENOUS | Status: AC
  Administered 2012-06-13: 20 mg via INTRAVENOUS
  Filled 2012-06-13: qty 50

## 2012-06-13 MED ORDER — ONDANSETRON HCL 4 MG PO TABS: 4.0000 mg | ORAL_TABLET | Freq: Three times a day (TID) | ORAL | Status: DC | PRN

## 2012-06-13 MED ORDER — DICYCLOMINE HCL 10 MG/ML IM SOLN
20.0000 mg | Freq: Once | INTRAMUSCULAR | Status: AC
  Administered 2012-06-13: 20 mg via INTRAMUSCULAR
  Filled 2012-06-13: qty 2

## 2012-06-13 MED ORDER — OXYCODONE-ACETAMINOPHEN 5-325 MG PO TABS: ORAL_TABLET | ORAL | Status: DC

## 2012-06-13 MED ORDER — METRONIDAZOLE 500 MG PO TABS: 500.0000 mg | ORAL_TABLET | Freq: Three times a day (TID) | ORAL | Status: DC

## 2012-06-13 MED ORDER — METOCLOPRAMIDE HCL 5 MG/ML IJ SOLN
10.0000 mg | Freq: Once | INTRAMUSCULAR | Status: AC
  Administered 2012-06-13: 10 mg via INTRAVENOUS
  Filled 2012-06-13: qty 2

## 2012-06-13 MED ORDER — GI COCKTAIL ~~LOC~~
30.0000 mL | Freq: Once | ORAL | Status: AC
  Administered 2012-06-13: 30 mL via ORAL
  Filled 2012-06-13: qty 30

## 2012-06-13 NOTE — ED Notes (Signed)
Arrives by EMS, here for NVD x2 hrs. Alert, NAD, calm, interactive, actively vomiting, IV started by EMS, zofran given PTA w/o relief.

## 2012-06-13 NOTE — ED Notes (Signed)
Pt has finished oral contrast, CT aware.

## 2012-06-13 NOTE — ED Provider Notes (Signed)
History     CSN: [id number]  Arrival date & time 06/13/12  6578   First MD Initiated Contact with Patient 06/13/12 [phone]      Chief Complaint  Patient presents with  . Emesis  . Diarrhea    (Consider location/radiation/quality/duration/timing/severity/associated sxs/prior treatment) HPI History provided by patient. At home tonight and developed acute onset nausea vomiting followed by diarrhea. Some epigastric abdominal cramping intermittently. Was at home with her children, no known sick contacts. No fevers or chills. No recent travel. No recent antibiotics. No known bad food exposures. Called EMS for persistent vomiting. No blood in emesis or stools. No bilious emesis. Currently denies any abdominal pain. Symptoms moderate in severity. No known alleviating factors.    Past Medical History  Diagnosis Date  . Anemia   . Diabetes mellitus   . Gestational diabetes     Past Surgical History  Procedure Date  . Cardiac surgery     at 26 years old    No family history on file.  History  Substance Use Topics  . Smoking status: Current Some Day Smoker  . Smokeless tobacco: Never Used  . Alcohol Use: No    OB History    Grav Para Term Preterm Abortions TAB SAB Ect Mult Living   3 3 2 1      2       Review of Systems  Constitutional: Negative for fever and chills.  HENT: Negative for neck pain and neck stiffness.   Eyes: Negative for pain.  Respiratory: Negative for shortness of breath.   Cardiovascular: Negative for chest pain.  Gastrointestinal: Positive for nausea, vomiting, abdominal pain and diarrhea. Negative for blood in stool and abdominal distention.  Genitourinary: Negative for dysuria.  Musculoskeletal: Negative for back pain.  Skin: Negative for rash.  Neurological: Negative for headaches.  All other systems reviewed and are negative.    Allergies  Review of patient's allergies indicates no known allergies.  Home Medications  No current outpatient  prescriptions on file.  BP 140/86  Pulse 51  Temp 98.3 F (36.8 C) (Oral)  Resp 18  SpO2 100%  Breastfeeding? No  Physical Exam  Constitutional: She is oriented to person, place, and time. She appears well-developed and well-nourished.  HENT:  Head: Normocephalic and atraumatic.  Eyes: EOM are normal. Pupils are equal, round, and reactive to light.  Neck: Neck supple.  Cardiovascular: Normal rate, regular rhythm and intact distal pulses.   Pulmonary/Chest: Effort normal and breath sounds normal. No respiratory distress.  Abdominal: Soft. Bowel sounds are normal. She exhibits no distension. There is no rebound and no guarding.       Mild epigastric tenderness. No abdominal tenderness otherwise negative Murphys sign.  Musculoskeletal: Normal range of motion. She exhibits no edema.  Neurological: She is alert and oriented to person, place, and time.  Skin: Skin is warm and dry.    ED Course  Procedures (including critical care time)  Results for orders placed during the hospital encounter of 06/13/12  POCT I-STAT, CHEM 8      Component Value Range   Sodium 139  135 - 145 mEq/L   Potassium 3.9  3.5 - 5.1 mEq/L   Chloride 106  96 - 112 mEq/L   BUN 14  6 - 23 mg/dL   Creatinine, Ser 2.95  0.50 - 1.10 mg/dL   Glucose, Bld 284 (*) 70 - 99 mg/dL   Calcium, Ion 1.32  4.40 - 1.23 mmol/L   TCO2 21  0 - 100 mmol/L   Hemoglobin 14.3  12.0 - 15.0 g/dL   HCT 16.1  09.6 - 04.5 %  GLUCOSE, CAPILLARY      Component Value Range   Glucose-Capillary 139 (*) 70 - 99 mg/dL   Comment 1 Documented in Chart     Comment 2 Notify RN     IV fluids. IV Zofran. Patient given IM Phenergan for persistent emesis.  Recheck after a liter of fluids, improving but still nauseated. Repeat abdominal exam no peritonitis. Patient still not comfortable, placed on dehydration protocol.  Plan clear liquids when symptomatically improving. UA and lipase pending. Plan serial abdominal exams.  MDM   Nausea  vomiting and diarrhea. No acute abdomen.  Labs reviewed. Persistent symptoms. CU observation protocol.        Sunnie Nielsen, MD 06/13/12 [phone]

## 2012-06-13 NOTE — ED Provider Notes (Signed)
Melanie [name] is a 26 y.o. female transferred to CDU from [facility] B. Signout from Dr. Theodoro Kalata as follows: Patient is an otherwise healthy 26 year old female brought in by EMS 2 hours after onset of nausea vomiting. It is been difficult to control the emesis.  Plan is to hydrate, control emesis and decrease pain. If this cannot be accomplished in a reasonable time CT abdomen and pelvis would be indicated.  0740 Pt seen and examined at the bedside she is sleeping, difficult to arouse. Patient's reports a colicky epigastric pain 10 out of 10 at its worst 0/10 right now. She reports that she has vomited multiple times and still feels nauseous and sick. Her lung sounds are clear to auscultation bilaterally heart is a regular rate and rhythm with no murmurs rubs or gallops, abdominal exam produces a mild tenderness to deep palpation of the right upper and left lower cord draped with no peritoneal signs. I wrote the patient for patient for Bentyl and Reglan IV.   8:39 AM Patient has not received her medications however she continues to have emesis is bilious in nature I am going to order a CT abdomen pelvis  11:40 AM patient feels improved. She is tolerating by mouth at this time. Patient has a mild elevation in her white blood cell count of 12.9. CT abdomen pelvis shows colitis. Discussed with attending Dr. Karma Ganja. We will treat her with Cipro and Flagyl. Return precautions will be given  Results for orders placed during the hospital encounter of 06/13/12  URINALYSIS, ROUTINE W REFLEX MICROSCOPIC      Component Value Range   Color, Urine YELLOW  YELLOW   APPearance CLEAR  CLEAR   Specific Gravity, Urine <1.005 (*) 1.005 - 1.030   pH 6.0  5.0 - 8.0   Glucose, UA NEGATIVE  NEGATIVE mg/dL   Hgb urine dipstick NEGATIVE  NEGATIVE   Bilirubin Urine NEGATIVE  NEGATIVE   Ketones, ur NEGATIVE  NEGATIVE mg/dL   Protein, ur NEGATIVE  NEGATIVE mg/dL   Urobilinogen, UA 0.2  0.0 - 1.0 mg/dL   Nitrite NEGATIVE   NEGATIVE   Leukocytes, UA NEGATIVE  NEGATIVE  POCT I-STAT, CHEM 8      Component Value Range   Sodium 139  135 - 145 mEq/L   Potassium 3.9  3.5 - 5.1 mEq/L   Chloride 106  96 - 112 mEq/L   BUN 14  6 - 23 mg/dL   Creatinine, Ser 9.81  0.50 - 1.10 mg/dL   Glucose, Bld 191 (*) 70 - 99 mg/dL   Calcium, Ion 4.78  2.95 - 1.23 mmol/L   TCO2 21  0 - 100 mmol/L   Hemoglobin 14.3  12.0 - 15.0 g/dL   HCT 62.1  30.8 - 65.7 %  GLUCOSE, CAPILLARY      Component Value Range   Glucose-Capillary 139 (*) 70 - 99 mg/dL   Comment 1 Documented in Chart     Comment 2 Notify RN    STOOL CULTURE      Component Value Range   Specimen Description STOOL     Special Requests NONE     Culture Culture reincubated for better growth     Report Status PENDING    COMPREHENSIVE METABOLIC PANEL      Component Value Range   Sodium 138  135 - 145 mEq/L   Potassium 3.7  3.5 - 5.1 mEq/L   Chloride 103  96 - 112 mEq/L   CO2 22  19 -  32 mEq/L   Glucose, Bld 184 (*) 70 - 99 mg/dL   BUN 13  6 - 23 mg/dL   Creatinine, Ser 1.61  0.50 - 1.10 mg/dL   Calcium 9.4  8.4 - 09.6 mg/dL   Total Protein 7.8  6.0 - 8.3 g/dL   Albumin 3.8  3.5 - 5.2 g/dL   AST 20  0 - 37 U/L   ALT 10  0 - 35 U/L   Alkaline Phosphatase 86  39 - 117 U/L   Total Bilirubin 0.2 (*) 0.3 - 1.2 mg/dL   GFR calc non Af Amer >90  >90 mL/min   GFR calc Af Amer >90  >90 mL/min  LIPASE, BLOOD      Component Value Range   Lipase 22  11 - 59 U/L  CBC      Component Value Range   WBC 12.9 (*) 4.0 - 10.5 K/uL   RBC 3.96  3.87 - 5.11 MIL/uL   Hemoglobin 12.0  12.0 - 15.0 g/dL   HCT 04.5  40.9 - 81.1 %   MCV 91.9  78.0 - 100.0 fL   MCH 30.3  26.0 - 34.0 pg   MCHC 33.0  30.0 - 36.0 g/dL   RDW 91.4  78.2 - 95.6 %   Platelets 260  150 - 400 K/uL   Ct Abdomen Pelvis W Contrast  06/13/2012  *RADIOLOGY REPORT*  Clinical Data: Epigastric pain,, vomiting, diarrhea  CT ABDOMEN AND PELVIS WITH CONTRAST  Technique:  Multidetector CT imaging of the abdomen and  pelvis was performed following the standard protocol during bolus administration of intravenous contrast.  Contrast: 80mL OMNIPAQUE IOHEXOL 300 MG/ML  SOLN  Comparison: 12/06/2006.  Findings: Sagittal images of the spine are unremarkable.  Lung bases are unremarkable.  Enhanced liver shows no biliary ductal dilatation. There is a cyst or hemangioma in the left hepatic lobe anteriorly measures 9 mm.  Gallbladder is contracted without evidence of calcified gallstones.  The pancreas, spleen and adrenal glands are unremarkable.  No small bowel obstruction.  No ascites or free abdominal air. Kidneys are symmetrical in size and enhancement.  No aortic aneurysm.  No hydronephrosis or hydroureter.  In axial image 44 there is a nonspecific mild enhancement and minimal thickening of the right colonic wall.  Mild colitis cannot be excluded.  The study is limited without contrast opacification of the colon.  In axial image 54 and coronal image 33 there is a mild thickening of the terminal ileal wall and mild enhancement of the mucosa.  Findings are suspicious for infectious ileitis. Inflammatory bowel disease may have a similar appearance.  No inflammation of surrounding mesenteric fat.  Normal appendix is partially visualized in axial image 55.  There is  fluid in the mild distended rectosigmoid colon. Unenhanced uterus is unremarkable.  Small amount of free fluid is noted in the right posterior cul-de-sac surrounding the right ovary.  A collapsed follicle in the right ovary measures 1.7 cm. Clinical correlation is necessary to exclude a ruptured ovarian follicle.  Further evaluation with pelvic ultrasound could be performed.  Minimal congested right adnexal vessels.  No calcified calculi are noted within urinary bladder.  No inguinal adenopathy. No destructive bony lesions are noted within pelvis.  IMPRESSION:  1.  No hydronephrosis or hydroureter. 2.  There is nonspecific mild enhancement and mild thickening of the right  colonic wall.  Mild colitis cannot be excluded.  There is mild thickening of the terminal ileal wall and subtle enhancement  of the mucosa.  Infectious terminal ileitis cannot be excluded. Clinical correlation is necessary.  Similar appearance may be seen in inflammatory bowel disease.  No stranding of the surrounding mesenteric fat.  Normal appendix is partially visualized. 3.  Mild distention of the rectosigmoid colon with fluid may be due to diarrhea. 4.  There is a collapsed follicle within right ovary measures small amount of free fluid noted posterior to right ovary.  Recent ruptured follicle cannot be excluded.  Clinical correlation is necessary.  Further evaluation with pelvic ultrasound could be performed.   Original Report Authenticated By: Natasha Mead, M.D.      Pt verbalized understanding and agrees with care plan. Outpatient follow-up and return precautions given.    New Prescriptions   CIPROFLOXACIN (CIPRO) 500 MG TABLET    Take 1 tablet (500 mg total) by mouth every 12 (twelve) hours.   METRONIDAZOLE (FLAGYL) 500 MG TABLET    Take 1 tablet (500 mg total) by mouth 3 (three) times daily.   ONDANSETRON (ZOFRAN) 4 MG TABLET    Take 1 tablet (4 mg total) by mouth every 8 (eight) hours as needed for nausea.   OXYCODONE-ACETAMINOPHEN (PERCOCET/ROXICET) 5-325 MG PER TABLET    1 to 2 tabs PO q6hrs  PRN for pain     Wynetta Emery, PA-C 06/13/12 1540

## 2012-06-13 NOTE — ED Notes (Signed)
Pt cont to c/o nausea.  Pt had small amt of loose, watery stool in bedpan. Yellow colored watery stool.

## 2012-06-13 NOTE — ED Notes (Signed)
Pt vomited approx 150 ccs of yellowish fluid

## 2012-06-13 NOTE — Progress Notes (Signed)
Utilization review completed.  P.J. Shaiden Aldous,RN,BSN Case Manager [phone]  

## 2012-06-13 NOTE — ED Notes (Signed)
Up to b/r after incontinence in stretcher.

## 2012-06-13 NOTE — ED Provider Notes (Signed)
Medical screening examination/treatment/procedure(s) were performed by non-physician practitioner and as supervising physician I was immediately available for consultation/collaboration.  Derwood Kaplan, MD 06/13/12 [phone]

## 2012-06-13 NOTE — ED Notes (Signed)
EDP into room 

## 2012-06-13 NOTE — ED Notes (Signed)
Lab finished at BS 

## 2012-06-14 DIAGNOSIS — E876 Hypokalemia: Secondary | ICD-10-CM | POA: Diagnosis present

## 2012-06-14 DIAGNOSIS — A09 Infectious gastroenteritis and colitis, unspecified: Principal | ICD-10-CM | POA: Diagnosis present

## 2012-06-14 DIAGNOSIS — Z23 Encounter for immunization: Secondary | ICD-10-CM

## 2012-06-14 DIAGNOSIS — Z79899 Other long term (current) drug therapy: Secondary | ICD-10-CM

## 2012-06-14 DIAGNOSIS — R197 Diarrhea, unspecified: Secondary | ICD-10-CM | POA: Diagnosis present

## 2012-06-14 DIAGNOSIS — E860 Dehydration: Secondary | ICD-10-CM | POA: Diagnosis present

## 2012-06-14 DIAGNOSIS — R112 Nausea with vomiting, unspecified: Secondary | ICD-10-CM | POA: Diagnosis present

## 2012-06-14 DIAGNOSIS — F17200 Nicotine dependence, unspecified, uncomplicated: Secondary | ICD-10-CM | POA: Diagnosis present

## 2012-06-14 NOTE — ED Notes (Addendum)
Per Ptar. Pt states Nausea on Monday and then emesis as well. Pt states abdominal pain as well due to not eating since Monday. Pt alert and oriented. Pt not currently vomiting in triage.

## 2012-06-15 ENCOUNTER — Inpatient Hospital Stay (HOSPITAL_COMMUNITY)
Admission: EM | Admit: 2012-06-15 | Discharge: 2012-06-17 | DRG: 392 | Disposition: A | Discharge status: Home / Self Care | Payer: Medicaid Other | Attending: Internal Medicine | Admitting: Internal Medicine

## 2012-06-15 ENCOUNTER — Encounter (HOSPITAL_COMMUNITY): Payer: Self-pay | Admitting: Internal Medicine

## 2012-06-15 DIAGNOSIS — E860 Dehydration: Secondary | ICD-10-CM | POA: Diagnosis present

## 2012-06-15 DIAGNOSIS — K5289 Other specified noninfective gastroenteritis and colitis: Secondary | ICD-10-CM

## 2012-06-15 DIAGNOSIS — R112 Nausea with vomiting, unspecified: Secondary | ICD-10-CM | POA: Diagnosis present

## 2012-06-15 DIAGNOSIS — K529 Noninfective gastroenteritis and colitis, unspecified: Secondary | ICD-10-CM | POA: Diagnosis present

## 2012-06-15 DIAGNOSIS — R197 Diarrhea, unspecified: Secondary | ICD-10-CM | POA: Diagnosis present

## 2012-06-15 DIAGNOSIS — E876 Hypokalemia: Secondary | ICD-10-CM | POA: Diagnosis present

## 2012-06-15 HISTORY — DX: Gestational diabetes mellitus in pregnancy, unspecified control: O24.419

## 2012-06-15 HISTORY — DX: Neoplasm of unspecified behavior of other specified sites: D49.89

## 2012-06-15 LAB — COMPREHENSIVE METABOLIC PANEL
ALT: 8 U/L (ref 0–35)
Calcium: 8.5 mg/dL (ref 8.4–10.5)
Creatinine, Ser: 0.68 mg/dL (ref 0.50–1.10)
GFR calc Af Amer: >90 mL/min (ref >90)
Glucose, Bld: 104 mg/dL — ABNORMAL HIGH (ref 70–99)
Sodium: 137 mEq/L (ref 135–145)
Total Protein: 6.9 g/dL (ref 6.0–8.3)

## 2012-06-15 LAB — URINALYSIS, ROUTINE W REFLEX MICROSCOPIC
Hgb urine dipstick: NEGATIVE
Specific Gravity, Urine: >1.03 — ABNORMAL HIGH (ref 1.005–1.030)
Urobilinogen, UA: 1 mg/dL (ref 0.0–1.0)
pH: 5.5 (ref 5.0–8.0)

## 2012-06-15 LAB — URINE MICROSCOPIC-ADD ON

## 2012-06-15 LAB — BASIC METABOLIC PANEL
BUN: 7 mg/dL (ref 6–23)
Calcium: 9.7 mg/dL (ref 8.4–10.5)
GFR calc non Af Amer: >90 mL/min (ref >90)
Glucose, Bld: 132 mg/dL — ABNORMAL HIGH (ref 70–99)

## 2012-06-15 LAB — CBC WITH DIFFERENTIAL/PLATELET
Eosinophils Absolute: 0 10*3/uL (ref 0.0–0.7)
Eosinophils Relative: 0 % (ref 0–5)
HCT: 38.3 % (ref 36.0–46.0)
Hemoglobin: 13.1 g/dL (ref 12.0–15.0)
Lymphs Abs: 1.2 10*3/uL (ref 0.7–4.0)
MCH: 31.1 pg (ref 26.0–34.0)
MCV: 91 fL (ref 78.0–100.0)
Monocytes Absolute: 0.5 10*3/uL (ref 0.1–1.0)
Monocytes Relative: 7 % (ref 3–12)
RBC: 4.21 MIL/uL (ref 3.87–5.11)

## 2012-06-15 LAB — MAGNESIUM: Magnesium: 1.8 mg/dL (ref 1.5–2.5)

## 2012-06-15 MED ORDER — ONDANSETRON HCL 4 MG/2ML IJ SOLN
4.0000 mg | Freq: Once | INTRAMUSCULAR | Status: AC
  Administered 2012-06-15: 4 mg via INTRAVENOUS
  Filled 2012-06-15: qty 2

## 2012-06-15 MED ORDER — ACETAMINOPHEN 325 MG PO TABS: 650.0000 mg | ORAL_TABLET | Freq: Four times a day (QID) | ORAL | Status: DC | PRN

## 2012-06-15 MED ORDER — PROMETHAZINE HCL 25 MG RE SUPP: 25.0000 mg | Freq: Four times a day (QID) | RECTAL | Status: DC | PRN

## 2012-06-15 MED ORDER — POTASSIUM CHLORIDE 10 MEQ/100ML IV SOLN
10.0000 meq | INTRAVENOUS | Status: DC
  Administered 2012-06-15 (×3): 10 meq via INTRAVENOUS
  Filled 2012-06-15 (×3): qty 100

## 2012-06-15 MED ORDER — POTASSIUM CHLORIDE 10 MEQ/100ML IV SOLN
10.0000 meq | INTRAVENOUS | Status: AC
  Filled 2012-06-15 (×4): qty 100

## 2012-06-15 MED ORDER — DICYCLOMINE HCL 10 MG/ML IM SOLN
20.0000 mg | Freq: Once | INTRAMUSCULAR | Status: AC
  Administered 2012-06-15: 20 mg via INTRAMUSCULAR
  Filled 2012-06-15: qty 2

## 2012-06-15 MED ORDER — PROMETHAZINE HCL 25 MG/ML IJ SOLN
12.5000 mg | Freq: Four times a day (QID) | INTRAMUSCULAR | Status: DC | PRN
  Administered 2012-06-15 (×2): 12.5 mg via INTRAVENOUS
  Filled 2012-06-15 (×2): qty 1

## 2012-06-15 MED ORDER — ONDANSETRON HCL 4 MG/2ML IJ SOLN: 4.0000 mg | Freq: Four times a day (QID) | INTRAMUSCULAR | Status: AC | PRN

## 2012-06-15 MED ORDER — SODIUM CHLORIDE 0.9 % IV SOLN
1000.0000 mL | Freq: Once | INTRAVENOUS | Status: AC
  Administered 2012-06-15: 1000 mL via INTRAVENOUS

## 2012-06-15 MED ORDER — SODIUM CHLORIDE 0.9 % IV SOLN
1000.0000 mL | INTRAVENOUS | Status: DC
  Administered 2012-06-15 (×2): 1000 mL via INTRAVENOUS

## 2012-06-15 MED ORDER — ONDANSETRON HCL 4 MG/2ML IJ SOLN: 4.0000 mg | Freq: Four times a day (QID) | INTRAMUSCULAR | Status: DC | PRN

## 2012-06-15 MED ORDER — DOCUSATE SODIUM 100 MG PO CAPS
100.0000 mg | ORAL_CAPSULE | Freq: Two times a day (BID) | ORAL | Status: DC
  Administered 2012-06-15: 100 mg via ORAL

## 2012-06-15 MED ORDER — ONDANSETRON HCL 4 MG/2ML IJ SOLN: 4.0000 mg | Freq: Three times a day (TID) | INTRAMUSCULAR | Status: DC | PRN

## 2012-06-15 MED ORDER — ONDANSETRON HCL 4 MG PO TABS: 4.0000 mg | ORAL_TABLET | Freq: Four times a day (QID) | ORAL | Status: DC | PRN

## 2012-06-15 MED ORDER — ACETAMINOPHEN 650 MG RE SUPP: 650.0000 mg | Freq: Four times a day (QID) | RECTAL | Status: DC | PRN

## 2012-06-15 MED ORDER — SODIUM CHLORIDE 0.9 % IV SOLN: INTRAVENOUS | Status: DC

## 2012-06-15 MED ORDER — HYDROCODONE-ACETAMINOPHEN 5-325 MG PO TABS
1.0000 | ORAL_TABLET | ORAL | Status: DC | PRN
  Administered 2012-06-15 (×2): 1 via ORAL
  Filled 2012-06-15 (×2): qty 1

## 2012-06-15 MED ORDER — ZOLPIDEM TARTRATE 5 MG PO TABS
5.0000 mg | ORAL_TABLET | Freq: Once | ORAL | Status: AC
  Administered 2012-06-15: 5 mg via ORAL
  Filled 2012-06-15: qty 1

## 2012-06-15 MED ORDER — POTASSIUM CHLORIDE CRYS ER 20 MEQ PO TBCR
40.0000 meq | EXTENDED_RELEASE_TABLET | ORAL | Status: AC
  Administered 2012-06-15 (×2): 40 meq via ORAL
  Filled 2012-06-15 (×2): qty 2

## 2012-06-15 MED ORDER — PROMETHAZINE HCL 25 MG PO TABS
25.0000 mg | ORAL_TABLET | Freq: Four times a day (QID) | ORAL | Status: DC | PRN
  Filled 2012-06-15: qty 1

## 2012-06-15 NOTE — Progress Notes (Signed)
Orthostatic Vital Signs  Lying BP:116/83 HR: 60  Sitting BP: 133/87 HR: 51  Standing: 141/95  HR: 62

## 2012-06-15 NOTE — Progress Notes (Signed)
I have seen and assessed patient and agree Dr. Celene Kras assessment and plan. Patient with some clinical improvement. Stool studies are pending. Continue empiric IV antibiotics. Supportive care. We'll place patient on clear liquid diet and monitor.

## 2012-06-15 NOTE — ED Provider Notes (Signed)
History     CSN: [id number]  Arrival date & time 06/14/12  2248   First MD Initiated Contact with Patient 06/15/12 0059      Chief Complaint  Patient presents with  . Nausea  . Emesis    (Consider location/radiation/quality/duration/timing/severity/associated sxs/prior treatment) HPI 26 year old female presents to emergency room complaining of persistent nausea and vomiting with diffuse abdominal pain. Patient seen in the emergency department on Tuesday. Patient had prolonged course in the emergency department arriving around 3 AM, and leaving around 3 PM. She had prolonged stay in the CDU to control her nausea and vomiting. She was diagnosed with colitis and sent home with Cipro Flagyl Zofran and Percocet. Patient reports she never really tolerated by mouth well during her ED course. She reports she's been unable to keep down anything despite the Zofran. Patient reports she's had similar presentations in the past with nausea and vomiting and diffuse abdominal pain usually once a year for the last 3 or 4 years. She normally comes to the emergency department and get IV hydration and is able to go home. She has never seen a gastroenterologist. She does not have a primary care Dr. She denies any fever, no urinary symptoms. No unusual foods, no sick contacts. No prior diagnosis of IBS or IBD.  Past Medical History  Diagnosis Date  . Anemia   . Diabetes mellitus   . Gestational diabetes     Past Surgical History  Procedure Date  . Cardiac surgery     at 26 years old    No family history on file.  History  Substance Use Topics  . Smoking status: Current Some Day Smoker  . Smokeless tobacco: Never Used  . Alcohol Use: No    OB History    Grav Para Term Preterm Abortions TAB SAB Ect Mult Living   3 3 2 1      2       Review of Systems  See History of Present Illness; otherwise all other systems are reviewed and negative  Allergies  Review of patient's allergies indicates no  known allergies.  Home Medications   Current Outpatient Rx  Name  Route  Sig  Dispense  Refill  . CIPROFLOXACIN HCL 500 MG PO TABS   Oral   Take 1 tablet (500 mg total) by mouth every 12 (twelve) hours.   28 tablet   0   . METRONIDAZOLE 500 MG PO TABS   Oral   Take 1 tablet (500 mg total) by mouth 3 (three) times daily.   14 tablet   0   . ONDANSETRON HCL 4 MG PO TABS   Oral   Take 1 tablet (4 mg total) by mouth every 8 (eight) hours as needed for nausea.   10 tablet   0   . OXYCODONE-ACETAMINOPHEN 5-325 MG PO TABS      1 to 2 tabs PO q6hrs  PRN for pain   15 tablet   0     BP 140/75  Pulse 50  Temp 98.8 F (37.1 C) (Oral)  Resp 16  SpO2 100%  LMP 05/24/2012  Physical Exam  Nursing note and vitals reviewed. Constitutional: She is oriented to person, place, and time. She appears distressed.  HENT:  Head: Normocephalic and atraumatic.  Nose: Nose normal.  Mouth/Throat: Oropharynx is clear and moist.       Dry mucous membranes, uncomfortable appearing  Eyes: Conjunctivae normal and EOM are normal. Pupils are equal, round, and reactive to  light.  Neck: Normal range of motion. Neck supple. No JVD present. No tracheal deviation present. No thyromegaly present.  Cardiovascular: Normal rate, regular rhythm, normal heart sounds and intact distal pulses.  Exam reveals no gallop and no friction rub.   No murmur heard. Pulmonary/Chest: Effort normal and breath sounds normal. No stridor. No respiratory distress. She has no wheezes. She has no rales. She exhibits no tenderness.  Abdominal: Soft. She exhibits no distension and no mass. There is tenderness (diffuse tenderness). There is no rebound and no guarding.       Hyperactive bowel sounds  Musculoskeletal: Normal range of motion. She exhibits no edema and no tenderness.  Lymphadenopathy:    She has no cervical adenopathy.  Neurological: She is alert and oriented to person, place, and time. She has normal reflexes. No  cranial nerve deficit. She exhibits normal muscle tone. Coordination normal.  Skin: Skin is warm and dry. No rash noted. No erythema. No pallor.  Psychiatric: She has a normal mood and affect. Her behavior is normal. Judgment and thought content normal.    ED Course  Procedures (including critical care time)  Labs Reviewed  URINALYSIS, ROUTINE W REFLEX MICROSCOPIC - Abnormal; Notable for the following:    Color, Urine AMBER (*)  BIOCHEMICALS MAY BE AFFECTED BY COLOR   APPearance CLOUDY (*)     Specific Gravity, Urine >1.030 (*)     Glucose, UA 100 (*)     Bilirubin Urine SMALL (*)     Ketones, ur 15 (*)     Protein, ur 30 (*)     Leukocytes, UA SMALL (*)     All other components within normal limits  BASIC METABOLIC PANEL - Abnormal; Notable for the following:    Potassium 3.1 (*)     Glucose, Bld 132 (*)     All other components within normal limits  URINE MICROSCOPIC-ADD ON - Abnormal; Notable for the following:    Squamous Epithelial / LPF MANY (*)     Bacteria, UA FEW (*)     All other components within normal limits  CBC WITH DIFFERENTIAL  POCT PREGNANCY, URINE  URINE CULTURE   Ct Abdomen Pelvis W Contrast  06/13/2012  *RADIOLOGY REPORT*  Clinical Data: Epigastric pain,, vomiting, diarrhea  CT ABDOMEN AND PELVIS WITH CONTRAST  Technique:  Multidetector CT imaging of the abdomen and pelvis was performed following the standard protocol during bolus administration of intravenous contrast.  Contrast: 80mL OMNIPAQUE IOHEXOL 300 MG/ML  SOLN  Comparison: 12/06/2006.  Findings: Sagittal images of the spine are unremarkable.  Lung bases are unremarkable.  Enhanced liver shows no biliary ductal dilatation. There is a cyst or hemangioma in the left hepatic lobe anteriorly measures 9 mm.  Gallbladder is contracted without evidence of calcified gallstones.  The pancreas, spleen and adrenal glands are unremarkable.  No small bowel obstruction.  No ascites or free abdominal air. Kidneys are  symmetrical in size and enhancement.  No aortic aneurysm.  No hydronephrosis or hydroureter.  In axial image 44 there is a nonspecific mild enhancement and minimal thickening of the right colonic wall.  Mild colitis cannot be excluded.  The study is limited without contrast opacification of the colon.  In axial image 54 and coronal image 33 there is a mild thickening of the terminal ileal wall and mild enhancement of the mucosa.  Findings are suspicious for infectious ileitis. Inflammatory bowel disease may have a similar appearance.  No inflammation of surrounding mesenteric fat.  Normal  appendix is partially visualized in axial image 55.  There is  fluid in the mild distended rectosigmoid colon. Unenhanced uterus is unremarkable.  Small amount of free fluid is noted in the right posterior cul-de-sac surrounding the right ovary.  A collapsed follicle in the right ovary measures 1.7 cm. Clinical correlation is necessary to exclude a ruptured ovarian follicle.  Further evaluation with pelvic ultrasound could be performed.  Minimal congested right adnexal vessels.  No calcified calculi are noted within urinary bladder.  No inguinal adenopathy. No destructive bony lesions are noted within pelvis.  IMPRESSION:  1.  No hydronephrosis or hydroureter. 2.  There is nonspecific mild enhancement and mild thickening of the right colonic wall.  Mild colitis cannot be excluded.  There is mild thickening of the terminal ileal wall and subtle enhancement of the mucosa.  Infectious terminal ileitis cannot be excluded. Clinical correlation is necessary.  Similar appearance may be seen in inflammatory bowel disease.  No stranding of the surrounding mesenteric fat.  Normal appendix is partially visualized. 3.  Mild distention of the rectosigmoid colon with fluid may be due to diarrhea. 4.  There is a collapsed follicle within right ovary measures small amount of free fluid noted posterior to right ovary.  Recent ruptured follicle  cannot be excluded.  Clinical correlation is necessary.  Further evaluation with pelvic ultrasound could be performed.   Original Report Authenticated By: Natasha Mead, M.D.      1. Acute colitis   2. Nausea vomiting and diarrhea       MDM  26 year old female with intractable nausea and vomiting found to have mild colitis with thickening of the terminal ileal wall and right colon. Patient appears dehydrated clinically. She does have ketones in her urine. Will discuss with hospitalist for admission.        Olivia Mackie, MD 06/15/12 [phone]

## 2012-06-15 NOTE — H&P (Signed)
PCP:  none   Chief Complaint:   Nausea / vomiting and diarrhea  HPI: Melanie [name] is a 26 y.o. female   has a past medical history of Anemia; Gestational diabetes mellitus in pregnancy; and Cardiac tumor.   Presented with  3-4 day history of extensive diarrhea up to 10 times a day associated with nausea and vomiting no fever but some chills. She have had this episodes in the past few years but never was seen by GI.  She was just seen 2 days ago in ER and given prescription for Cipro and flagyl to treat colitis that was found on CT scan. She could not tolerate this due to nausea and presented again this time she was also found to be hypokalemic and [facility] was called for admssion. She denies any blood in stool no fever but she have had some chills. She has generalized abdominal pain.  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   Review of Systems:    Pertinent positives include: chills, abdominal pain, nausea, vomiting, diarrhea,  Constitutional:  No weight loss, night sweats, Fevers,  fatigue, weight loss  HEENT:  No headaches, Difficulty swallowing,Tooth/dental problems,Sore throat,  No sneezing, itching, ear ache, nasal congestion, post nasal drip,  Cardio-vascular:  No chest pain, Orthopnea, PND, anasarca, dizziness, palpitations.no Bilateral lower extremity swelling  GI:  No heartburn, indigestion, change in bowel habits, loss of appetite, melena, blood in stool, hematemesis Resp:  no shortness of breath at rest. No dyspnea on exertion, No excess mucus, no productive cough, No non-productive cough, No coughing up of blood.No change in color of mucus.No wheezing. Skin:  no rash or lesions. No jaundice GU:  no dysuria, change in color of urine, no urgency or frequency. No straining to urinate.  No flank pain.  Musculoskeletal:  No joint pain or no joint swelling. No decreased range of motion. No back pain.  Psych:  No change in mood or affect. No  depression or anxiety. No memory loss.  Neuro: no localizing neurological complaints, no tingling, no weakness, no double vision, no gait abnormality, no slurred speech, no confusion  Otherwise ROS are negative except for above, 10 systems were reviewed  Past Medical History: Past Medical History  Diagnosis Date  . Anemia   . Gestational diabetes mellitus in pregnancy   . Cardiac tumor    Past Surgical History  Procedure Date  . Cardiac surgery     at 26 years old     Medications: Prior to Admission medications   Medication Sig Start Date End Date Taking? Authorizing Provider  ciprofloxacin (CIPRO) 500 MG tablet Take 1 tablet (500 mg total) by mouth every 12 (twelve) hours. 06/13/12  Yes Nicole Pisciotta, PA-C  metroNIDAZOLE (FLAGYL) 500 MG tablet Take 1 tablet (500 mg total) by mouth 3 (three) times daily. 06/13/12  Yes Nicole Pisciotta, PA-C  ondansetron (ZOFRAN) 4 MG tablet Take 1 tablet (4 mg total) by mouth every 8 (eight) hours as needed for nausea. 06/13/12  Yes Nicole Pisciotta, PA-C  oxyCODONE-acetaminophen (PERCOCET/ROXICET) 5-325 MG per tablet 1 to 2 tabs PO q6hrs  PRN for pain 06/13/12  Yes Nicole Pisciotta, PA-C    Allergies:  No Known Allergies  Social History:  Ambulatory  independently Lives at home   reports that she has been smoking.  She has never used smokeless tobacco. She reports that she drinks alcohol. She reports that she uses illicit drugs (Marijuana) about once per week.   Family History:  family history includes Hypertension in her mother.    Physical Exam: Patient Vitals for the past 24 hrs:  BP Temp Temp src Pulse Resp SpO2  06/15/12 0450 133/70 mmHg 98.2 F (36.8 C) - 51  18  100 %  06/15/12 0430 142/78 mmHg - - 52  - 100 %  06/15/12 0400 114/63 mmHg - - 53  - 99 %  06/15/12 0330 142/82 mmHg - - 61  - 100 %  06/15/12 0252 140/75 mmHg 98.8 F (37.1 C) Oral 50  16  -  06/14/12 2248 141/88 mmHg 99.7 F (37.6 C) Oral 63  20  100 %    1.  General:  in No Acute distress 2. Psychological: Alert and  Oriented 3. Head/ENT:   Dry Mucous Membranes                          Head Non traumatic, neck supple                          Normal  Dentition 4. SKIN:   decreased Skin turgor,  Skin clean Dry and intact no rash 5. Heart: Regular rate and rhythm no Murmur, Rub or gallop 6. Lungs: Clear to auscultation bilaterally, no wheezes or crackles   7. Abdomen: Soft, somewhat tender, Non distended, diminished bowel sounds 8. Lower extremities: no clubbing, cyanosis, or edema 9. Neurologically Grossly intact, moving all 4 extremities equally 10. MSK: Normal range of motion  body mass index is unknown because there is no height or weight on file.   Labs on Admission:   [facility] 06/15/12 0139 06/13/12 0540  NA 137 138  K 3.1* 3.7  CL 101 103  CO2 23 22  GLUCOSE 132* 184*  BUN 7 13  CREATININE 0.63 0.67  CALCIUM 9.7 9.4  MG -- --  PHOS -- --    Basename 06/13/12 0540  AST 20  ALT 10  ALKPHOS 86  BILITOT 0.2*  PROT 7.8  ALBUMIN 3.8    Basename 06/13/12 0540  LIPASE 22  AMYLASE --    Basename 06/15/12 0139 06/13/12 0721  WBC 6.8 12.9*  NEUTROABS 5.1 --  HGB 13.1 12.0  HCT 38.3 36.4  MCV 91.0 91.9  PLT 275 260   No results found for this basename: CKTOTAL:3,CKMB:3,CKMBINDEX:3,TROPONINI:3 in the last 72 hours No results found for this basename: TSH,T4TOTAL,FREET3,T3FREE,THYROIDAB in the last 72 hours No results found for this basename: VITAMINB12:2,FOLATE:2,FERRITIN:2,TIBC:2,IRON:2,RETICCTPCT:2 in the last 72 hours Lab Results  Component Value Date   HGBA1C  Value: 5.1 (NOTE)   The ADA recommends the following therapeutic goals for glycemic   control related to Hgb A1C measurement:   Goal of Therapy:   < 7.0% Hgb A1C   Action Suggested:  > 8.0% Hgb A1C   Ref:  Diabetes Care, 22, Suppl. 1, 1999 03/02/2007    The CrCl is unknown because both a height and weight (above a minimum accepted value) are required for this  calculation. ABG    Component Value Date/Time   TCO2 21 06/13/2012 0345     No results found for this basename: DDIMER     Other results:   UA questionable evidence of infection urine culture pendng    Cultures:    Component Value Date/Time   SDES STOOL 06/13/2012 0411   SPECREQUEST NONE 06/13/2012 0411   CULT  Value: NO GROWTH 1 DAY Note: REDUCED NORMAL FLORA PRESENT 06/13/2012  0411   REPTSTATUS PENDING 06/13/2012 0411       Radiological Exams on Admission: Ct Abdomen Pelvis W Contrast  06/13/2012  *RADIOLOGY REPORT*  Clinical Data: Epigastric pain,, vomiting, diarrhea  CT ABDOMEN AND PELVIS WITH CONTRAST  Technique:  Multidetector CT imaging of the abdomen and pelvis was performed following the standard protocol during bolus administration of intravenous contrast.  Contrast: 80mL OMNIPAQUE IOHEXOL 300 MG/ML  SOLN  Comparison: 12/06/2006.  Findings: Sagittal images of the spine are unremarkable.  Lung bases are unremarkable.  Enhanced liver shows no biliary ductal dilatation. There is a cyst or hemangioma in the left hepatic lobe anteriorly measures 9 mm.  Gallbladder is contracted without evidence of calcified gallstones.  The pancreas, spleen and adrenal glands are unremarkable.  No small bowel obstruction.  No ascites or free abdominal air. Kidneys are symmetrical in size and enhancement.  No aortic aneurysm.  No hydronephrosis or hydroureter.  In axial image 44 there is a nonspecific mild enhancement and minimal thickening of the right colonic wall.  Mild colitis cannot be excluded.  The study is limited without contrast opacification of the colon.  In axial image 54 and coronal image 33 there is a mild thickening of the terminal ileal wall and mild enhancement of the mucosa.  Findings are suspicious for infectious ileitis. Inflammatory bowel disease may have a similar appearance.  No inflammation of surrounding mesenteric fat.  Normal appendix is partially visualized in axial image 55.   There is  fluid in the mild distended rectosigmoid colon. Unenhanced uterus is unremarkable.  Small amount of free fluid is noted in the right posterior cul-de-sac surrounding the right ovary.  A collapsed follicle in the right ovary measures 1.7 cm. Clinical correlation is necessary to exclude a ruptured ovarian follicle.  Further evaluation with pelvic ultrasound could be performed.  Minimal congested right adnexal vessels.  No calcified calculi are noted within urinary bladder.  No inguinal adenopathy. No destructive bony lesions are noted within pelvis.  IMPRESSION:  1.  No hydronephrosis or hydroureter. 2.  There is nonspecific mild enhancement and mild thickening of the right colonic wall.  Mild colitis cannot be excluded.  There is mild thickening of the terminal ileal wall and subtle enhancement of the mucosa.  Infectious terminal ileitis cannot be excluded. Clinical correlation is necessary.  Similar appearance may be seen in inflammatory bowel disease.  No stranding of the surrounding mesenteric fat.  Normal appendix is partially visualized. 3.  Mild distention of the rectosigmoid colon with fluid may be due to diarrhea. 4.  There is a collapsed follicle within right ovary measures small amount of free fluid noted posterior to right ovary.  Recent ruptured follicle cannot be excluded.  Clinical correlation is necessary.  Further evaluation with pelvic ultrasound could be performed.   Original Report Authenticated By: Natasha Mead, M.D.     Chart has been reviewed  Assessment/Plan  26 yo with recurrent episodes of intestinal inflammation ( colitis vs illeitis)  suspicious for inflammatory bowel disease.   Present on Admission:  . Nausea vomiting and diarrhea - obtain stool cultures and c.diff PCR. symptomatic management of nausea and vomiting. Of note her UA could show signs of mild infection would await results of urine culture before adjusting therapy. Likeley to be covered with Cipro.  .  Dehydration - give IVF and check orthostatics . Acute colitis -For now will cover with IV cipro and flagyl. Obtain stool cultures. Patient will need referral to GI for further  work up.  . Hypokalemia - will replace and recheck   Prophylaxis: SCD     CODE STATUS:FULL CODE  Other plan as per orders.  I have spent a total of 55 min on this admission  Danyon Mcginness 06/15/2012, 5:42 AM

## 2012-06-15 NOTE — Progress Notes (Signed)
Utilization review completed. Kiah Vanalstine, RN, BSN. 

## 2012-06-15 NOTE — Progress Notes (Signed)
Patient complaining of potassium burning veins. MD notified. Holding potassium runs until STAT labs drawn. Will then notify MD of results.

## 2012-06-16 DIAGNOSIS — R197 Diarrhea, unspecified: Secondary | ICD-10-CM

## 2012-06-16 LAB — BASIC METABOLIC PANEL
BUN: 4 mg/dL — ABNORMAL LOW (ref 6–23)
Chloride: 105 mEq/L (ref 96–112)
GFR calc Af Amer: >90 mL/min (ref >90)
GFR calc non Af Amer: >90 mL/min (ref >90)
Potassium: 3.5 mEq/L (ref 3.5–5.1)
Sodium: 138 mEq/L (ref 135–145)

## 2012-06-16 LAB — URINE CULTURE: Colony Count: NO GROWTH

## 2012-06-16 LAB — STOOL CULTURE

## 2012-06-16 LAB — CBC
HCT: 37.3 % (ref 36.0–46.0)
MCHC: 34.3 g/dL (ref 30.0–36.0)
Platelets: 283 10*3/uL (ref 150–400)
RDW: 13.2 % (ref 11.5–15.5)
WBC: 7 10*3/uL (ref 4.0–10.5)

## 2012-06-16 LAB — PHOSPHORUS: Phosphorus: 2.9 mg/dL (ref 2.3–4.6)

## 2012-06-16 LAB — TSH: TSH: 0.953 u[IU]/mL (ref 0.350–4.500)

## 2012-06-16 MED ORDER — INFLUENZA VIRUS VACC SPLIT PF IM SUSP
0.5000 mL | INTRAMUSCULAR | Status: AC
  Administered 2012-06-17: 0.5 mL via INTRAMUSCULAR
  Filled 2012-06-16: qty 0.5

## 2012-06-16 MED ORDER — CIPROFLOXACIN IN D5W 400 MG/200ML IV SOLN
400.0000 mg | Freq: Once | INTRAVENOUS | Status: AC
  Administered 2012-06-16: 400 mg via INTRAVENOUS
  Filled 2012-06-16: qty 200

## 2012-06-16 MED ORDER — POTASSIUM CHLORIDE CRYS ER 20 MEQ PO TBCR
40.0000 meq | EXTENDED_RELEASE_TABLET | Freq: Once | ORAL | Status: AC
  Administered 2012-06-16: 40 meq via ORAL
  Filled 2012-06-16: qty 2

## 2012-06-16 MED ORDER — SODIUM CHLORIDE 0.9 % IV SOLN
1000.0000 mL | INTRAVENOUS | Status: DC
  Administered 2012-06-16: 1000 mL via INTRAVENOUS

## 2012-06-16 MED ORDER — METRONIDAZOLE IN NACL 5-0.79 MG/ML-% IV SOLN
500.0000 mg | Freq: Once | INTRAVENOUS | Status: AC
  Administered 2012-06-16: 500 mg via INTRAVENOUS
  Filled 2012-06-16: qty 100

## 2012-06-16 MED ORDER — MAGNESIUM SULFATE 40 MG/ML IJ SOLN
2.0000 g | Freq: Once | INTRAMUSCULAR | Status: AC
  Administered 2012-06-16: 2 g via INTRAVENOUS
  Filled 2012-06-16: qty 50

## 2012-06-16 MED ORDER — CIPROFLOXACIN IN D5W 400 MG/200ML IV SOLN
400.0000 mg | Freq: Two times a day (BID) | INTRAVENOUS | Status: DC
  Administered 2012-06-16 – 2012-06-17 (×2): 400 mg via INTRAVENOUS
  Filled 2012-06-16 (×4): qty 200

## 2012-06-16 MED ORDER — SODIUM CHLORIDE 0.9 % IV SOLN: 1000.0000 mL | INTRAVENOUS | Status: DC

## 2012-06-16 MED ORDER — METRONIDAZOLE IN NACL 5-0.79 MG/ML-% IV SOLN
500.0000 mg | Freq: Three times a day (TID) | INTRAVENOUS | Status: DC
  Administered 2012-06-16 – 2012-06-17 (×3): 500 mg via INTRAVENOUS
  Filled 2012-06-16 (×5): qty 100

## 2012-06-16 NOTE — Progress Notes (Signed)
Verbal order to advance order to regular if pt tolerates full liquids. Pt has tolerated diet thus far, stated she has a lactose intolerance, so gave her saltine crackers and apple sauce. Has eaten with no nausea, no vomitting and no pain. Will change diet to regular as MD has requested.

## 2012-06-16 NOTE — Progress Notes (Signed)
[facility] PROGRESS NOTE  Melanie [name] UJW:[id number] DOB: 06-May-1986 DOA: 06/15/2012 PCP: No primary provider on file.  Assessment/Plan: #1 colitis Patient had presented with nausea vomiting abdominal pain and diarrhea which she states has improved clinically. Patient states has had similar bouts yearly for approximately 4 times. CT of the abdomen and pelvis which was done on 06/13/2012 shows mild thickening of the right colonic wall mild thickening of the terminal ileal wall and subtle enhancement of the mucosa. Stool studies are pending. Patient with no further nausea vomiting diarrhea abdominal pain. Patient states she is hungry. Will advance diet to a full liquid diet. Continue empiric IV ciprofloxacin and Flagyl. Will consult with GI for further evaluation and management due to concern for an inflammatory bowel syndrome/disease. Follow.  #2 hypokalemia Secondary to GI losses secondary to problem #1. Improved. Will give a dose of potassium today.  #3 dehydration IV fluids. Follow.  #4 nausea/ vomiting /diarrhea Secondary to problem #1. Clinical improvement. Continue supportive care. Advance diet to full liquid diet.  #5 prophylaxis SCDs for DVT.  Code Status: Full Family Communication: Updated patient no family at bedside. Disposition Plan: Home when medically stable.   Consultants:  GI pending  Procedures:  CT abdomen and pelvis 06/13/12  Antibiotics:  IV Cipro 06/15/12  IV Flagyl 06/15/12  HPI/Subjective: Patient denies any abdominal pain. No diarrhea. No emesis, no nausea  Objective: Filed Vitals:   06/15/12 0450 06/15/12 1416 06/15/12 2134 06/16/12 0558  BP: 133/70 95/52 132/82 116/68  Pulse: 51 65 52 53  Temp: 98.2 F (36.8 C) 98.4 F (36.9 C) 98.5 F (36.9 C) 98.5 F (36.9 C)  TempSrc:      Resp: 18 20 16 18   SpO2: 100% 100% 100% 98%    Intake/Output Summary (Last 24 hours) at 06/16/12 1020 Last data filed at 06/15/12 1800  Gross per 24  hour  Intake 1672.92 ml  Output      0 ml  Net 1672.92 ml   There were no vitals filed for this visit.  Exam:   General:  NAD  Cardiovascular: RRR. No JVD. No lower extremity edema.  Respiratory: CTAB  Abdomen: Soft/NT/ND/+BS  Data Reviewed: Basic Metabolic Panel:  Lab 06/16/12 5621 06/15/12 0905 06/15/12 0139 06/13/12 0540 06/13/12 0345  NA 138 137 137 138 139  K 3.5 3.2* 3.1* 3.7 3.9  CL 105 106 101 103 106  CO2 20 22 23 22  --  GLUCOSE 111* 104* 132* 184* 191*  BUN 4* 4* 7 13 14   CREATININE 0.62 0.68 0.63 0.67 0.90  CALCIUM 9.2 8.5 9.7 9.4 --  MG 1.8 1.8 -- -- --  PHOS 2.9 -- -- -- --   Liver Function Tests:  Lab 06/15/12 0905 06/13/12 0540  AST 19 20  ALT 8 10  ALKPHOS 76 86  BILITOT 0.4 0.2*  PROT 6.9 7.8  ALBUMIN 3.4* 3.8    Lab 06/13/12 0540  LIPASE 22  AMYLASE --   No results found for this basename: AMMONIA:5 in the last 168 hours CBC:  Lab 06/16/12 0715 06/15/12 0139 06/13/12 0721 06/13/12 0345  WBC 7.0 6.8 12.9* --  NEUTROABS -- 5.1 -- --  HGB 12.8 13.1 12.0 14.3  HCT 37.3 38.3 36.4 42.0  MCV 91.0 91.0 91.9 --  PLT 283 275 260 --   Cardiac Enzymes: No results found for this basename: CKTOTAL:5,CKMB:5,CKMBINDEX:5,TROPONINI:5 in the last 168 hours BNP (last 3 results) No results found for this basename: PROBNP:3 in the last 8760 hours  CBG:  Lab 06/13/12 0351  GLUCAP 139*    Recent Results (from the past 240 hour(s))  STOOL CULTURE     Status: Normal   Collection Time   06/13/12  4:11 AM      Component Value Range Status Comment   Specimen Description STOOL   Final    Special Requests NONE   Final    Culture     Final    Value: NO SALMONELLA, SHIGELLA, CAMPYLOBACTER, YERSINIA, OR E.COLI 0157:H7 ISOLATED     Note: REDUCED NORMAL FLORA PRESENT   Report Status 06/16/2012 FINAL   Final   URINE CULTURE     Status: Normal   Collection Time   06/15/12  1:45 AM      Component Value Range Status Comment   Specimen Description URINE,  CLEAN CATCH   Final    Special Requests CX ADDED AT 0219 ON [id number]   Final    Culture  Setup Time 06/15/2012 09:26   Final    Colony Count NO GROWTH   Final    Culture NO GROWTH   Final    Report Status 06/16/2012 FINAL   Final      Studies: No results found.  Scheduled Meds:   . [COMPLETED] ciprofloxacin  400 mg Intravenous Once  . ciprofloxacin  400 mg Intravenous Q12H  . [COMPLETED] metronidazole  500 mg Intravenous Once  . metronidazole  500 mg Intravenous Q8H  . [COMPLETED] potassium chloride  40 mEq Oral Q4H  . potassium chloride  40 mEq Oral Once  . [COMPLETED] zolpidem  5 mg Oral Once  . [DISCONTINUED] docusate sodium  100 mg Oral BID   Continuous Infusions:   . sodium chloride    . [DISCONTINUED] sodium chloride 1,000 mL (06/15/12 2044)    Principal Problem:  *Acute colitis Active Problems:  Nausea vomiting and diarrhea  Dehydration  Hypokalemia    Time spent: > 35 mins    [facility]  [facility] Pager [phone]. If 8PM-8AM, please contact night-coverage at [email or web address], password [email or web address] 06/16/2012, 10:20 AM  LOS: 1 day

## 2012-06-16 NOTE — Consult Note (Signed)
[facility] CONSULT Reason for consult: Nausea vomiting and diarrhea and abnormal CT scan Referring Physician: [facility] no family physician. Primary GI: None the patient is unassigned   Melanie [name] is an 26 y.o. female.  HPI: She is admitted for several day history of extensive watery diarrhea nausea and vomiting. This became so severe that she came to the emergency room. Her CT scan revealed normal appendix with a small amount of free fluid in the pelvis is no sign of bowel obstruction. Were asked to see her to rule out Crohn's disease. There were no signs of Crohn's disease on the CT scan. The patient feels much better and is tolerating liquids and has had no further diarrhea. It is notable that the patient has completely normal bowels throughout the entire year but has an acute episode of diarrhea and nausea and vomiting every December for the past 3 years. Question her carefully in her bowel movements and appetite are completely normal the other 11-1/2 months of the year. She specifically denies bleeding, melena, diarrhea, abdominal pain, nausea, or loss of appetite.  Past Medical History  Diagnosis Date  . Anemia   . Gestational diabetes mellitus in pregnancy   . Cardiac tumor     Past Surgical History  Procedure Date  . Cardiac surgery     at 26 years old    Family History  Problem Relation Age of Onset  . Hypertension Mother     Social History:  reports that she has been smoking.  She has never used smokeless tobacco. She reports that she drinks alcohol. She reports that she uses illicit drugs (Marijuana) about once per week.  Allergies: No Known Allergies  Medications;    . [COMPLETED] ciprofloxacin  400 mg Intravenous Once  . ciprofloxacin  400 mg Intravenous Q12H  . magnesium sulfate 1 - 4 g bolus IVPB  2 g Intravenous Once  . [COMPLETED] metronidazole  500 mg Intravenous Once  . metronidazole  500 mg Intravenous Q8H  . [COMPLETED] potassium  chloride  40 mEq Oral Q4H  . [COMPLETED] potassium chloride  40 mEq Oral Once  . [COMPLETED] zolpidem  5 mg Oral Once  . [DISCONTINUED] docusate sodium  100 mg Oral BID   PRN Meds acetaminophen, acetaminophen, HYDROcodone-acetaminophen, [EXPIRED] ondansetron (ZOFRAN) IV, ondansetron (ZOFRAN) IV, ondansetron, promethazine, promethazine, promethazine Results for orders placed during the hospital encounter of 06/15/12 (from the past 48 hour(s))  CBC WITH DIFFERENTIAL     Status: Normal   Collection Time   06/15/12  1:39 AM      Component Value Range Comment   WBC 6.8  4.0 - 10.5 K/uL    RBC 4.21  3.87 - 5.11 MIL/uL    Hemoglobin 13.1  12.0 - 15.0 g/dL    HCT 95.6  21.3 - 08.6 %    MCV 91.0  78.0 - 100.0 fL    MCH 31.1  26.0 - 34.0 pg    MCHC 34.2  30.0 - 36.0 g/dL    RDW 57.8  46.9 - 62.9 %    Platelets 275  150 - 400 K/uL    Neutrophils Relative 75  43 - 77 %    Neutro Abs 5.1  1.7 - 7.7 K/uL    Lymphocytes Relative 18  12 - 46 %    Lymphs Abs 1.2  0.7 - 4.0 K/uL    Monocytes Relative 7  3 - 12 %    Monocytes Absolute 0.5  0.1 - 1.0 K/uL  Eosinophils Relative 0  0 - 5 %    Eosinophils Absolute 0.0  0.0 - 0.7 K/uL    Basophils Relative 0  0 - 1 %    Basophils Absolute 0.0  0.0 - 0.1 K/uL   BASIC METABOLIC PANEL     Status: Abnormal   Collection Time   06/15/12  1:39 AM      Component Value Range Comment   Sodium 137  135 - 145 mEq/L    Potassium 3.1 (*) 3.5 - 5.1 mEq/L    Chloride 101  96 - 112 mEq/L    CO2 23  19 - 32 mEq/L    Glucose, Bld 132 (*) 70 - 99 mg/dL    BUN 7  6 - 23 mg/dL    Creatinine, Ser 4.69  0.50 - 1.10 mg/dL    Calcium 9.7  8.4 - 62.9 mg/dL    GFR calc non Af Amer >90  >90 mL/min    GFR calc Af Amer >90  >90 mL/min   URINALYSIS, ROUTINE W REFLEX MICROSCOPIC     Status: Abnormal   Collection Time   06/15/12  1:45 AM      Component Value Range Comment   Color, Urine AMBER (*) YELLOW BIOCHEMICALS MAY BE AFFECTED BY COLOR   APPearance CLOUDY (*) CLEAR     Specific Gravity, Urine >1.030 (*) 1.005 - 1.030    pH 5.5  5.0 - 8.0    Glucose, UA 100 (*) NEGATIVE mg/dL    Hgb urine dipstick NEGATIVE  NEGATIVE    Bilirubin Urine SMALL (*) NEGATIVE    Ketones, ur 15 (*) NEGATIVE mg/dL    Protein, ur 30 (*) NEGATIVE mg/dL    Urobilinogen, UA 1.0  0.0 - 1.0 mg/dL    Nitrite NEGATIVE  NEGATIVE    Leukocytes, UA SMALL (*) NEGATIVE   URINE MICROSCOPIC-ADD ON     Status: Abnormal   Collection Time   06/15/12  1:45 AM      Component Value Range Comment   Squamous Epithelial / LPF MANY (*) RARE    WBC, UA 7-10  <3 WBC/hpf    RBC / HPF 0-2  <3 RBC/hpf    Bacteria, UA FEW (*) RARE    Urine-Other MUCOUS PRESENT     URINE CULTURE     Status: Normal   Collection Time   06/15/12  1:45 AM      Component Value Range Comment   Specimen Description URINE, CLEAN CATCH      Special Requests CX ADDED AT 0219 ON [id number]      Culture  Setup Time 06/15/2012 09:26      Colony Count NO GROWTH      Culture NO GROWTH      Report Status 06/16/2012 FINAL     POCT PREGNANCY, URINE     Status: Normal   Collection Time   06/15/12  2:00 AM      Component Value Range Comment   Preg Test, Ur NEGATIVE  NEGATIVE   MAGNESIUM     Status: Normal   Collection Time   06/15/12  9:05 AM      Component Value Range Comment   Magnesium 1.8  1.5 - 2.5 mg/dL   COMPREHENSIVE METABOLIC PANEL     Status: Abnormal   Collection Time   06/15/12  9:05 AM      Component Value Range Comment   Sodium 137  135 - 145 mEq/L    Potassium 3.2 (*) 3.5 -  5.1 mEq/L    Chloride 106  96 - 112 mEq/L    CO2 22  19 - 32 mEq/L    Glucose, Bld 104 (*) 70 - 99 mg/dL    BUN 4 (*) 6 - 23 mg/dL    Creatinine, Ser 1.61  0.50 - 1.10 mg/dL    Calcium 8.5  8.4 - 09.6 mg/dL    Total Protein 6.9  6.0 - 8.3 g/dL    Albumin 3.4 (*) 3.5 - 5.2 g/dL    AST 19  0 - 37 U/L    ALT 8  0 - 35 U/L    Alkaline Phosphatase 76  39 - 117 U/L    Total Bilirubin 0.4  0.3 - 1.2 mg/dL    GFR calc non Af Amer >90  >90 mL/min     GFR calc Af Amer >90  >90 mL/min   MAGNESIUM     Status: Normal   Collection Time   06/16/12  7:15 AM      Component Value Range Comment   Magnesium 1.8  1.5 - 2.5 mg/dL   PHOSPHORUS     Status: Normal   Collection Time   06/16/12  7:15 AM      Component Value Range Comment   Phosphorus 2.9  2.3 - 4.6 mg/dL   TSH     Status: Normal   Collection Time   06/16/12  7:15 AM      Component Value Range Comment   TSH 0.953  0.350 - 4.500 uIU/mL   CBC     Status: Normal   Collection Time   06/16/12  7:15 AM      Component Value Range Comment   WBC 7.0  4.0 - 10.5 K/uL    RBC 4.10  3.87 - 5.11 MIL/uL    Hemoglobin 12.8  12.0 - 15.0 g/dL    HCT 04.5  40.9 - 81.1 %    MCV 91.0  78.0 - 100.0 fL    MCH 31.2  26.0 - 34.0 pg    MCHC 34.3  30.0 - 36.0 g/dL    RDW 91.4  78.2 - 95.6 %    Platelets 283  150 - 400 K/uL   BASIC METABOLIC PANEL     Status: Abnormal   Collection Time   06/16/12  7:15 AM      Component Value Range Comment   Sodium 138  135 - 145 mEq/L    Potassium 3.5  3.5 - 5.1 mEq/L    Chloride 105  96 - 112 mEq/L    CO2 20  19 - 32 mEq/L    Glucose, Bld 111 (*) 70 - 99 mg/dL    BUN 4 (*) 6 - 23 mg/dL    Creatinine, Ser 2.13  0.50 - 1.10 mg/dL    Calcium 9.2  8.4 - 08.6 mg/dL    GFR calc non Af Amer >90  >90 mL/min    GFR calc Af Amer >90  >90 mL/min     No results found. ROS: Essentially negative as above Constitutional: HEENT: Cardiovascular: Respiratory: GI: GU: Musculoskeletal: Neuro/Psychiatric: Endocrine/Heme:            Blood pressure 116/68, pulse 53, temperature 98.5 F (36.9 C), temperature source Oral, resp. rate 18, last menstrual period 05/24/2012, SpO2 98.00%.  Physical exam:   Gen.-pleasant African American female no acute distress Eyes-clear nonicteric Lungs-clear Heart-regular rate and rhythm no murmurs or gallops Abdomen-soft and completely nontender  Assessment: 1. Acute nausea and  vomiting and diarrhea. I suspect this is  infectious. She has had one spell year for several days for the past several years and has been completely normal the remainder the year. This is not consistent with Crohn's disease.  Plan: Would go ahead and advance her diet and she is feeling better tomorrow send her home and would consider further evaluation for symptoms returned. I have given her my card to arrange a followup appointment in about 6 weeks.   Len Kluver JR,Karimah Winquist L 06/16/2012, 12:00 PM

## 2012-06-16 NOTE — Progress Notes (Signed)
CARE MANAGEMENT NOTE 06/16/2012  Patient:  Melanie [name], Melanie [name]   Account Number:  [id number]  Date Initiated:  06/16/2012  Documentation initiated by:  Vance Peper  Subjective/Objective Assessment:   26 yr old female adm for colitis     Action/Plan:   CM to arrange appt with PCP   Anticipated DC Date:  06/16/2012   Anticipated DC Plan:  HOME/SELF CARE      DC Planning Services  CM consult  Follow-up appt scheduled      Choice offered to / List presented to:             Status of service:  In process, will continue to follow Medicare Important Message given?   (If response is "NO", the following Medicare IM given date fields will be blank) Date Medicare IM given:   Date Additional Medicare IM given:    Discharge Disposition:  HOME/SELF CARE  Per UR Regulation:    If discussed at Long Length of Stay Meetings, dates discussed:    Comments:  06/16/12 10:52am Vance Peper, RN BSN Case Manager CM made appointment with [facility], Dr. Concepcion Elk- [address] [place] [phone] Appointment is January 2,2014 at 2:15pm. Patient instructed to take her insurance card, ID list of meds and $30.00 co-pay to appointment. Unable to make GI appointment. Patient will have to make that appointment through the Health Department because she has Medicaid [place] [employer]. Informed Dr. Janee Morn of this.

## 2012-06-17 LAB — BASIC METABOLIC PANEL
BUN: 4 mg/dL — ABNORMAL LOW (ref 6–23)
CO2: 21 mEq/L (ref 19–32)
Chloride: 106 mEq/L (ref 96–112)
GFR calc non Af Amer: >90 mL/min (ref >90)
Glucose, Bld: 119 mg/dL — ABNORMAL HIGH (ref 70–99)
Potassium: 3.6 mEq/L (ref 3.5–5.1)
Sodium: 138 mEq/L (ref 135–145)

## 2012-06-17 LAB — FECAL LACTOFERRIN, QUANT: Fecal Lactoferrin: POSITIVE

## 2012-06-17 LAB — CBC
HCT: 34.5 % — ABNORMAL LOW (ref 36.0–46.0)
Hemoglobin: 11.6 g/dL — ABNORMAL LOW (ref 12.0–15.0)
RBC: 3.76 MIL/uL — ABNORMAL LOW (ref 3.87–5.11)
WBC: 8.4 10*3/uL (ref 4.0–10.5)

## 2012-06-17 MED ORDER — CIPROFLOXACIN HCL 500 MG PO TABS: 500.0000 mg | ORAL_TABLET | Freq: Two times a day (BID) | ORAL | Status: AC

## 2012-06-17 MED ORDER — METRONIDAZOLE 500 MG PO TABS: 500.0000 mg | ORAL_TABLET | Freq: Three times a day (TID) | ORAL | Status: AC

## 2012-06-17 NOTE — Progress Notes (Signed)
06/17/2012 1130 NCM reviewed case and pt does not qualify for [facility] program for medication assistance. Pt has Medicaid. Isidoro Donning RN CCM Case Mgmt phone [phone]

## 2012-06-17 NOTE — Discharge Summary (Signed)
Physician Discharge Summary  Melanie [name] ZOX:[id number] DOB: 07-Jan-1986 DOA: 06/15/2012  PCP: No primary provider on file.  Admit date: 06/15/2012 Discharge date: 06/17/2012  Time spent: 60 minutes  Recommendations for Outpatient Follow-up:  1. Patient is to followup with Dr. Tyson Dense on 07/13/2012. All followup patient will need a basic metabolic profile done to followup on electrolytes and renal function. 2. Patient is to followup with Dr. Randa Evens of gastroenterology 2 weeks post discharge for further evaluation and followup on her acute colitis.  Discharge Diagnoses:  Principal Problem:  *Acute colitis Active Problems:  Nausea vomiting and diarrhea  Dehydration  Hypokalemia   Discharge Condition: Stable and improved  Diet recommendation: General  There were no vitals filed for this visit.  History of present illness:  Melanie [name] is a 26 y.o. female  has a past medical history of Anemia; Gestational diabetes mellitus in pregnancy; and Cardiac tumor.  Presented with  3-4 day history of extensive diarrhea up to 10 times a day associated with nausea and vomiting no fever but some chills. She have had this episodes in the past few years but never was seen by GI.  She was just seen 2 days ago in ER and given prescription for Cipro and flagyl to treat colitis that was found on CT scan. She could not tolerate this due to nausea and presented again this time she was also found to be hypokalemic and [facility] was called for admssion. She denies any blood in stool no fever but she have had some chills. She has generalized abdominal pain.  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    Hospital Course:  #1 colitis  Patient had presented with nausea, vomiting ,abdominal pain and diarrhea .patient had been seen earlier on in the ED on 06/13/2012 and at that time CT scan which was done showed mild thickening of the right colonic wall mild thickening of the terminal  ileal wall and subtle enhancement of the mucosa. Patient at that time was placed on oral Cipro and Flagyl however due to nausea and emesis was unable to keep medication down and presented back. Patient was subsequently admitted and stool studies were ordered. Patient was placed empirically on IV ciprofloxacin and Flagyl. Patient did not have any further loose stools during the hospitalization. Patient's nausea and vomiting improved. Patient's abdominal pain improved. Patient was subsequently started on clear liquids and her diet advanced. Patient tolerated her diet and did not have any further nausea vomiting or abdominal pain on a regular diet. A GI consultation was obtained and patient was seen in consultation by Dr. Randa Evens on 06/16/2012. It was felt per gastroenterology that this may likely be an infectious colitis. Patient had had spells of similar symptoms several years ago for several days and had been completely normal for the rest of the year. It was recommended that if patient improved clinically patient may be discharged home in stable and improved condition with close followup with gastroenterology as outpatient. Patient will be discharged home on oral ciprofloxacin and Flagyl for 9 more days to complete a ten-day course of antibiotic therapy. Patient will be discharged home in stable and improved condition and will followup with Dr. Randa Evens in 2 weeks. #2 hypokalemia  On admission patient was noted to be hypokalemic with a potassium of 3.1. It was felt patient's hypokalemia was likely secondary to GI losses secondary to her nausea emesis and diarrhea. Patient's potassium was repleted and her hypokalemia had resolved by  day of discharge. Patient be discharged in stable and improved condition.  #3 dehydration  On admission patient was noted to be dehydrated. Patient was hydrated with IV fluids and was euvolemic by day of discharge.   #4 nausea/ vomiting /diarrhea  Secondary to problem #1. See  problem #1. Patient improved clinically during the hospitalization was placed on supportive care did not have any further nausea vomiting or diarrhea. Patient's diet was advanced which she tolerated.   The rest of patient's chronic medical issues remained stable throughout the hospitalization and patient be discharged in stable and improved condition.   Consultations:  Gastroenterology: Dr. Randa Evens 06/16/2012  Discharge Exam: Filed Vitals:   06/16/12 0558 06/16/12 1415 06/16/12 2244 06/17/12 0715  BP: 116/68 115/71 120/53 125/69  Pulse: 53 60 50 55  Temp: 98.5 F (36.9 C) 98.6 F (37 C) 98.5 F (36.9 C) 98.8 F (37.1 C)  TempSrc:  Oral Oral   Resp: 18 20 18 18   SpO2: 98% 99% 99% 99%    General: NAD Cardiovascular: RRR Respiratory: CTAB  Discharge Instructions  Discharge Orders    Future Orders Please Complete By Expires   Diet general      Increase activity slowly      Discharge instructions      Comments:   Followup with Dr. Randa Evens of gastroenterology in 2 weeks Followup with PCP.       Medication List     As of 06/17/2012 10:16 AM    TAKE these medications         ciprofloxacin 500 MG tablet   Commonly known as: CIPRO   Take 1 tablet (500 mg total) by mouth every 12 (twelve) hours. Take for 9 days then stop.      metroNIDAZOLE 500 MG tablet   Commonly known as: FLAGYL   Take 1 tablet (500 mg total) by mouth 3 (three) times daily. Take for 9 days then stop.      ondansetron 4 MG tablet   Commonly known as: ZOFRAN   Take 1 tablet (4 mg total) by mouth every 8 (eight) hours as needed for nausea.      oxyCODONE-acetaminophen 5-325 MG per tablet   Commonly known as: PERCOCET/ROXICET   1 to 2 tabs PO q6hrs  PRN for pain           Follow-up Information    Follow up with EDWARDS JR,JAMES L, MD. Schedule an appointment as soon as possible for a visit in 2 weeks.   Contact information:   [address]                         [place] [facility] [place] [address] [phone]       Follow up with Dorrene German, MD. On 07/13/2012. (Followup with Dr. Tyson Dense who is going to be your  new PCP on 07/13/2012 at 2:15 PM.)    Contact information:   [address] [place] [address] [phone]          The results of significant diagnostics from this hospitalization (including imaging, microbiology, ancillary and laboratory) are listed below for reference.    Significant Diagnostic Studies: Ct Abdomen Pelvis W Contrast  06/13/2012  *RADIOLOGY REPORT*  Clinical Data: Epigastric pain,, vomiting, diarrhea  CT ABDOMEN AND PELVIS WITH CONTRAST  Technique:  Multidetector CT imaging of the abdomen and pelvis was performed following the standard protocol during bolus administration of intravenous contrast.  Contrast: 80mL OMNIPAQUE IOHEXOL 300 MG/ML  SOLN  Comparison: 12/06/2006.  Findings: Sagittal images of the spine are unremarkable.  Lung bases are unremarkable.  Enhanced liver shows no biliary ductal dilatation. There is a cyst or hemangioma in the left hepatic lobe anteriorly measures 9 mm.  Gallbladder is contracted without evidence of calcified gallstones.  The pancreas, spleen and adrenal glands are unremarkable.  No small bowel obstruction.  No ascites or free abdominal air. Kidneys are symmetrical in size and enhancement.  No aortic aneurysm.  No hydronephrosis or hydroureter.  In axial image 44 there is a nonspecific mild enhancement and minimal thickening of the right colonic wall.  Mild colitis cannot be excluded.  The study is limited without contrast opacification of the colon.  In axial image 54 and coronal image 33 there is a mild thickening of the terminal ileal wall and mild enhancement of the mucosa.  Findings are suspicious for infectious ileitis. Inflammatory bowel disease may have a similar appearance.  No inflammation of surrounding mesenteric fat.  Normal appendix is partially visualized in axial image 55.  There is   fluid in the mild distended rectosigmoid colon. Unenhanced uterus is unremarkable.  Small amount of free fluid is noted in the right posterior cul-de-sac surrounding the right ovary.  A collapsed follicle in the right ovary measures 1.7 cm. Clinical correlation is necessary to exclude a ruptured ovarian follicle.  Further evaluation with pelvic ultrasound could be performed.  Minimal congested right adnexal vessels.  No calcified calculi are noted within urinary bladder.  No inguinal adenopathy. No destructive bony lesions are noted within pelvis.  IMPRESSION:  1.  No hydronephrosis or hydroureter. 2.  There is nonspecific mild enhancement and mild thickening of the right colonic wall.  Mild colitis cannot be excluded.  There is mild thickening of the terminal ileal wall and subtle enhancement of the mucosa.  Infectious terminal ileitis cannot be excluded. Clinical correlation is necessary.  Similar appearance may be seen in inflammatory bowel disease.  No stranding of the surrounding mesenteric fat.  Normal appendix is partially visualized. 3.  Mild distention of the rectosigmoid colon with fluid may be due to diarrhea. 4.  There is a collapsed follicle within right ovary measures small amount of free fluid noted posterior to right ovary.  Recent ruptured follicle cannot be excluded.  Clinical correlation is necessary.  Further evaluation with pelvic ultrasound could be performed.   Original Report Authenticated By: Natasha Mead, M.D.     Microbiology: Recent Results (from the past 240 hour(s))  STOOL CULTURE     Status: Normal   Collection Time   06/13/12  4:11 AM      Component Value Range Status Comment   Specimen Description STOOL   Final    Special Requests NONE   Final    Culture     Final    Value: NO SALMONELLA, SHIGELLA, CAMPYLOBACTER, YERSINIA, OR E.COLI 0157:H7 ISOLATED     Note: REDUCED NORMAL FLORA PRESENT   Report Status 06/16/2012 FINAL   Final   URINE CULTURE     Status: Normal    Collection Time   06/15/12  1:45 AM      Component Value Range Status Comment   Specimen Description URINE, CLEAN CATCH   Final    Special Requests CX ADDED AT 0219 ON [id number]   Final    Culture  Setup Time 06/15/2012 09:26   Final    Colony Count NO GROWTH   Final  Culture NO GROWTH   Final    Report Status 06/16/2012 FINAL   Final      Labs: Basic Metabolic Panel:  Lab 06/17/12 1610 06/16/12 0715 06/15/12 0905 06/15/12 0139 06/13/12 0540  NA 138 138 137 137 138  K 3.6 3.5 3.2* 3.1* 3.7  CL 106 105 106 101 103  CO2 21 20 22 23 22   GLUCOSE 119* 111* 104* 132* 184*  BUN 4* 4* 4* 7 13  CREATININE 0.66 0.62 0.68 0.63 0.67  CALCIUM 8.9 9.2 8.5 9.7 9.4  MG -- 1.8 1.8 -- --  PHOS -- 2.9 -- -- --   Liver Function Tests:  Lab 06/15/12 0905 06/13/12 0540  AST 19 20  ALT 8 10  ALKPHOS 76 86  BILITOT 0.4 0.2*  PROT 6.9 7.8  ALBUMIN 3.4* 3.8    Lab 06/13/12 0540  LIPASE 22  AMYLASE --   No results found for this basename: AMMONIA:5 in the last 168 hours CBC:  Lab 06/17/12 0620 06/16/12 0715 06/15/12 0139 06/13/12 0721 06/13/12 0345  WBC 8.4 7.0 6.8 12.9* --  NEUTROABS -- -- 5.1 -- --  HGB 11.6* 12.8 13.1 12.0 14.3  HCT 34.5* 37.3 38.3 36.4 42.0  MCV 91.8 91.0 91.0 91.9 --  PLT 273 283 275 260 --   Cardiac Enzymes: No results found for this basename: CKTOTAL:5,CKMB:5,CKMBINDEX:5,TROPONINI:5 in the last 168 hours BNP: BNP (last 3 results) No results found for this basename: PROBNP:3 in the last 8760 hours CBG:  Lab 06/13/12 0351  GLUCAP 139*       Signed:  THOMPSON,DANIEL  [facility] 06/17/2012, 10:16 AM

## 2012-06-17 NOTE — Progress Notes (Signed)
Melanie [name] 10:18 AM  Subjective: Patient feels better and wants to go home and all symptoms have resolved  Objective: Vital signs stable afebrile no acute distress abdomen is soft nontender CBC okay  Assessment: Improved  Plan: Okay for outpatient workup and followup with my partner Dr. Randa Evens in a few weeks to discuss any further workup and plans  [facility]

## 2012-06-17 NOTE — Progress Notes (Signed)
Pt d/c to home. D/c instructions and medications reviewed with PT. Pt states understanding. All Pt questions answered

## 2012-08-05 ENCOUNTER — Emergency Department (HOSPITAL_COMMUNITY)
Admission: EM | Admit: 2012-08-05 | Discharge: 2012-08-06 | Disposition: A | Discharge status: Home / Self Care | Payer: Medicaid Other | Attending: Emergency Medicine | Admitting: Emergency Medicine

## 2012-08-05 ENCOUNTER — Encounter (HOSPITAL_COMMUNITY): Payer: Self-pay | Admitting: Emergency Medicine

## 2012-08-05 DIAGNOSIS — A084 Viral intestinal infection, unspecified: Secondary | ICD-10-CM

## 2012-08-05 DIAGNOSIS — R112 Nausea with vomiting, unspecified: Secondary | ICD-10-CM | POA: Insufficient documentation

## 2012-08-05 DIAGNOSIS — F17200 Nicotine dependence, unspecified, uncomplicated: Secondary | ICD-10-CM | POA: Insufficient documentation

## 2012-08-05 DIAGNOSIS — Z862 Personal history of diseases of the blood and blood-forming organs and certain disorders involving the immune mechanism: Secondary | ICD-10-CM | POA: Insufficient documentation

## 2012-08-05 DIAGNOSIS — R109 Unspecified abdominal pain: Secondary | ICD-10-CM | POA: Insufficient documentation

## 2012-08-05 DIAGNOSIS — Z8679 Personal history of other diseases of the circulatory system: Secondary | ICD-10-CM | POA: Insufficient documentation

## 2012-08-05 DIAGNOSIS — A088 Other specified intestinal infections: Secondary | ICD-10-CM | POA: Insufficient documentation

## 2012-08-05 DIAGNOSIS — Z3202 Encounter for pregnancy test, result negative: Secondary | ICD-10-CM | POA: Insufficient documentation

## 2012-08-05 DIAGNOSIS — Z8632 Personal history of gestational diabetes: Secondary | ICD-10-CM | POA: Insufficient documentation

## 2012-08-05 DIAGNOSIS — R197 Diarrhea, unspecified: Secondary | ICD-10-CM | POA: Insufficient documentation

## 2012-08-05 LAB — COMPREHENSIVE METABOLIC PANEL
ALT: 10 U/L (ref 0–35)
AST: 20 U/L (ref 0–37)
Albumin: 3.9 g/dL (ref 3.5–5.2)
Alkaline Phosphatase: 91 U/L (ref 39–117)
Chloride: 97 mEq/L (ref 96–112)
Potassium: 3.1 mEq/L — ABNORMAL LOW (ref 3.5–5.1)
Sodium: 135 mEq/L (ref 135–145)
Total Bilirubin: 0.3 mg/dL (ref 0.3–1.2)

## 2012-08-05 LAB — CBC WITH DIFFERENTIAL/PLATELET
Basophils Absolute: 0 10*3/uL (ref 0.0–0.1)
Basophils Relative: 0 % (ref 0–1)
Hemoglobin: 13.4 g/dL (ref 12.0–15.0)
MCHC: 34.3 g/dL (ref 30.0–36.0)
Monocytes Relative: 7 % (ref 3–12)
Neutro Abs: 5 10*3/uL (ref 1.7–7.7)
Neutrophils Relative %: 52 % (ref 43–77)

## 2012-08-05 LAB — LIPASE, BLOOD: Lipase: 28 U/L (ref 11–59)

## 2012-08-05 MED ORDER — SODIUM CHLORIDE 0.9 % IV BOLUS (SEPSIS)
1000.0000 mL | INTRAVENOUS | Status: AC
  Administered 2012-08-05: 1000 mL via INTRAVENOUS

## 2012-08-05 MED ORDER — MORPHINE SULFATE 4 MG/ML IJ SOLN
4.0000 mg | Freq: Once | INTRAMUSCULAR | Status: AC
  Administered 2012-08-05: 4 mg via INTRAVENOUS
  Filled 2012-08-05: qty 1

## 2012-08-05 MED ORDER — ONDANSETRON HCL 4 MG/2ML IJ SOLN
4.0000 mg | Freq: Once | INTRAMUSCULAR | Status: AC
  Administered 2012-08-05: 4 mg via INTRAVENOUS
  Filled 2012-08-05: qty 2

## 2012-08-05 NOTE — ED Notes (Signed)
Pt informed that a urine sample is needed 

## 2012-08-05 NOTE — ED Provider Notes (Signed)
History     CSN: [id number]  Arrival date & time 08/05/12  2100   First MD Initiated Contact with Patient 08/05/12 2242      Chief Complaint  Patient presents with  . Abdominal Pain  . Emesis    (Consider location/radiation/quality/duration/timing/severity/associated sxs/prior treatment) HPI Comments: This is a 27 year old female, who presents emergency department with chief complaint of nausea, vomiting, diarrhea, and abdominal pain.  Patient states that the abdominal pain started after the vomiting. Patient states her symptoms began today. She states that she has had the same in the past. Patient states that her symptoms are moderate to severe. She has not tried anything to alleviate her symptoms. She denies any sick contacts.  The history is provided by the patient. No language interpreter was used.    Past Medical History  Diagnosis Date  . Anemia   . Gestational diabetes mellitus in pregnancy   . Cardiac tumor     Past Surgical History  Procedure Date  . Cardiac surgery     at 27 years old    Family History  Problem Relation Age of Onset  . Hypertension Mother     History  Substance Use Topics  . Smoking status: Current Some Day Smoker  . Smokeless tobacco: Never Used  . Alcohol Use: Yes     Comment: occasionally    OB History    Grav Para Term Preterm Abortions TAB SAB Ect Mult Living   3 3 2 1      2       Review of Systems  All other systems reviewed and are negative.    Allergies  Review of patient's allergies indicates no known allergies.  Home Medications  No current outpatient prescriptions on file.  BP 126/86  Temp 98.3 F (36.8 C) (Oral)  Resp 24  SpO2 100%  LMP 07/24/2012  Physical Exam  Nursing note and vitals reviewed. Constitutional: She is oriented to person, place, and time. She appears well-developed and well-nourished.  HENT:  Head: Normocephalic and atraumatic.  Eyes: Conjunctivae normal and EOM are normal. Pupils are  equal, round, and reactive to light.  Neck: Normal range of motion. Neck supple.  Cardiovascular: Normal rate and regular rhythm.  Exam reveals no gallop and no friction rub.   No murmur heard. Pulmonary/Chest: Effort normal and breath sounds normal. No respiratory distress. She has no wheezes. She has no rales. She exhibits no tenderness.  Abdominal: Soft. Bowel sounds are normal. She exhibits no distension and no mass. There is no tenderness. There is no rebound and no guarding.       No right lower quadrant tenderness, or rebound tenderness, no right upper quadrant tenderness or Murphy sign, no signs of peritonitis.  Musculoskeletal: Normal range of motion. She exhibits no edema and no tenderness.  Neurological: She is alert and oriented to person, place, and time.  Skin: Skin is warm and dry.  Psychiatric: She has a normal mood and affect. Her behavior is normal. Judgment and thought content normal.    ED Course  Procedures (including critical care time)  Labs Reviewed  COMPREHENSIVE METABOLIC PANEL - Abnormal; Notable for the following:    Potassium 3.1 (*)     Glucose, Bld 193 (*)     All other components within normal limits  CBC WITH DIFFERENTIAL  LIPASE, BLOOD  URINALYSIS, ROUTINE W REFLEX MICROSCOPIC   Results for orders placed during the hospital encounter of 08/05/12  CBC WITH DIFFERENTIAL  Component Value Range   WBC 9.6  4.0 - 10.5 K/uL   RBC 4.20  3.87 - 5.11 MIL/uL   Hemoglobin 13.4  12.0 - 15.0 g/dL   HCT 19.1  47.8 - 29.5 %   MCV 93.1  78.0 - 100.0 fL   MCH 31.9  26.0 - 34.0 pg   MCHC 34.3  30.0 - 36.0 g/dL   RDW 62.1  30.8 - 65.7 %   Platelets 282  150 - 400 K/uL   Neutrophils Relative 52  43 - 77 %   Neutro Abs 5.0  1.7 - 7.7 K/uL   Lymphocytes Relative 38  12 - 46 %   Lymphs Abs 3.7  0.7 - 4.0 K/uL   Monocytes Relative 7  3 - 12 %   Monocytes Absolute 0.7  0.1 - 1.0 K/uL   Eosinophils Relative 2  0 - 5 %   Eosinophils Absolute 0.2  0.0 - 0.7 K/uL     Basophils Relative 0  0 - 1 %   Basophils Absolute 0.0  0.0 - 0.1 K/uL  COMPREHENSIVE METABOLIC PANEL      Component Value Range   Sodium 135  135 - 145 mEq/L   Potassium 3.1 (*) 3.5 - 5.1 mEq/L   Chloride 97  96 - 112 mEq/L   CO2 22  19 - 32 mEq/L   Glucose, Bld 193 (*) 70 - 99 mg/dL   BUN 10  6 - 23 mg/dL   Creatinine, Ser 8.46  0.50 - 1.10 mg/dL   Calcium 9.6  8.4 - 96.2 mg/dL   Total Protein 7.8  6.0 - 8.3 g/dL   Albumin 3.9  3.5 - 5.2 g/dL   AST 20  0 - 37 U/L   ALT 10  0 - 35 U/L   Alkaline Phosphatase 91  39 - 117 U/L   Total Bilirubin 0.3  0.3 - 1.2 mg/dL   GFR calc non Af Amer >90  >90 mL/min   GFR calc Af Amer >90  >90 mL/min  LIPASE, BLOOD      Component Value Range   Lipase 28  11 - 59 U/L  URINALYSIS, ROUTINE W REFLEX MICROSCOPIC      Component Value Range   Color, Urine YELLOW  YELLOW   APPearance HAZY (*) CLEAR   Specific Gravity, Urine 1.027  1.005 - 1.030   pH 6.0  5.0 - 8.0   Glucose, UA 100 (*) NEGATIVE mg/dL   Hgb urine dipstick NEGATIVE  NEGATIVE   Bilirubin Urine NEGATIVE  NEGATIVE   Ketones, ur 40 (*) NEGATIVE mg/dL   Protein, ur NEGATIVE  NEGATIVE mg/dL   Urobilinogen, UA 0.2  0.0 - 1.0 mg/dL   Nitrite NEGATIVE  NEGATIVE   Leukocytes, UA NEGATIVE  NEGATIVE  POCT PREGNANCY, URINE      Component Value Range   Preg Test, Ur NEGATIVE  NEGATIVE       1. Nausea and vomiting   2. Viral gastroenteritis       MDM  This is a 27 year old female with nausea and vomiting, and diarrhea. Suspect viral gastroenteritis. She also endorses some abdominal pain, but states that the abdominal pain did not occur until after she vomited several times. I believe this to be likely due to the vomiting. She does not have any localized abdominal tenderness concerning for appendicitis. I will order routine labs, and Zofran and pain medicine and will reevaluate the patient.   11:27 PM Will fluid challenge, and await  UA.  If patient is able to tolerate PO, I will  discharge her with zofran and specific return precautions.  Discussed the patient with Dr. Read Drivers, who agrees with the plan.  12:23 AM UA is negative.  Re-evaluated the patient's abdomen.  Mild discomfort, but without significant tenderness.  12:46 AM This patient was seen by and discussed with Dr. Read Drivers.  Doubt acute abdomen. Patient states that she is having active vomiting, however, there is only spit in the bedside tray.  Discussed with Dr. Read Drivers, who agrees with the plan. Will discharge with antiemetic.       Roxy Horseman, PA-C 08/06/12 0101

## 2012-08-05 NOTE — ED Notes (Signed)
Pt states feels like her Chron's is exacerbating

## 2012-08-05 NOTE — ED Notes (Signed)
Per EMS, patient complaining of nausea, vomiting, diarrhea, and abdominal pain for last two hours.  Patient was here in December for the same; was diagnosed with Crohn's disease, but patient missed her follow up appointment with specialist.  One episode of emesis in route.

## 2012-08-06 LAB — URINALYSIS, ROUTINE W REFLEX MICROSCOPIC
Bilirubin Urine: NEGATIVE
Glucose, UA: 100 mg/dL — AB
Ketones, ur: 40 mg/dL — AB
pH: 6 (ref 5.0–8.0)

## 2012-08-06 MED ORDER — ONDANSETRON HCL 4 MG/2ML IJ SOLN
4.0000 mg | Freq: Once | INTRAMUSCULAR | Status: AC
  Administered 2012-08-06: 4 mg via INTRAVENOUS
  Filled 2012-08-06: qty 2

## 2012-08-06 MED ORDER — PROMETHAZINE HCL 25 MG/ML IJ SOLN
25.0000 mg | Freq: Once | INTRAMUSCULAR | Status: AC
  Administered 2012-08-06: 25 mg via INTRAVENOUS
  Filled 2012-08-06: qty 1

## 2012-08-06 MED ORDER — PROMETHAZINE HCL 25 MG PO TABS: 25.0000 mg | ORAL_TABLET | Freq: Four times a day (QID) | ORAL | Status: DC | PRN

## 2012-08-06 MED ORDER — SODIUM CHLORIDE 0.9 % IV BOLUS (SEPSIS)
1000.0000 mL | INTRAVENOUS | Status: AC
  Administered 2012-08-06: 1000 mL via INTRAVENOUS

## 2012-08-06 NOTE — ED Notes (Signed)
Pt states understanding of discharge instructions 

## 2012-08-06 NOTE — ED Provider Notes (Signed)
Medical screening examination/treatment/procedure(s) were conducted as a shared visit with non-physician practitioner(s) and myself.  I personally evaluated the patient during the encounter  12:45 AM Abdomen soft, mild right suprapubic tenderness. Patient states his pain is consistent with pain she has after her menses on a regular basis.  Hanley Seamen, MD 08/06/12 [phone]

## 2012-08-06 NOTE — Discharge Instructions (Signed)
You have been diagnosed with undifferentiated abdominal pain.  Abdominal pain can be caused by many things. Your caregiver evaluates the seriousness of your pain by an examination and possibly blood or urine tests and imaging (CT scan, x-rays, ultrasound). Many cases can be observed and treated at home after initial evaluation in the emergency department. Even though you are being discharged home, abdominal pain can be unpredictable. Therefore, you need a repeat exam if your pain does not resolve, returns, or worsens. Most patient's with abdominal pain do not need to be admitted to the hospital or have surgery, but serious problems like appendicitis and gallbladder attacks can start out as nonspecific pain. Many abdominal conditions cannot be diagnosed in 1 visit, so followup evaluations are very important.  Seek immediate medical attention if:  *The pain does not go away or becomes severe. *Temperature above 101 develops *Repeated vomiting occurs(multiple episodes) *The pain becomes localized to portions of the abdomen. The right side could possibly be appendicitis. In an adult, the left lower portion of the abdomen could be colitis or diverticulitis. *Blood is being passed in stools or vomit *Return also if you develop chest pain, difficulty breathing, dizziness or fainting, or become confused poorly responsive or inconsolable (young children).     If you do not have a physician, you should reference the below phone numbers and call in the morning to establish follow up care.  B.R.A.T. Diet Your doctor has recommended the B.R.A.T. diet for you or your child until the condition improves. This is often used to help control diarrhea and vomiting symptoms. If you or your child can tolerate clear liquids, you may have:  Bananas.   Rice.   Applesauce.   Toast (and other simple starches such as crackers, potatoes, noodles).  Be sure to avoid dairy products, meats, and fatty foods until symptoms  are better. Fruit juices such as apple, grape, and prune juice can make diarrhea worse. Avoid these. Continue this diet for 2 days or as instructed by your caregiver. Document Released: 06/28/2005 Document Revised: 06/17/2011 Document Reviewed: 12/15/2006 [facility] Patient Information 2012 [place], [place]. Viral Gastroenteritis Viral gastroenteritis is also known as stomach flu. This condition affects the stomach and intestinal tract. It can cause sudden diarrhea and vomiting. The illness typically lasts 3 to 8 days. Most people develop an immune response that eventually gets rid of the virus. While this natural response develops, the virus can make you quite ill. CAUSES  Many different viruses can cause gastroenteritis, such as rotavirus or noroviruses. You can catch one of these viruses by consuming contaminated food or water. You may also catch a virus by sharing utensils or other personal items with an infected person or by touching a contaminated surface. SYMPTOMS  The most common symptoms are diarrhea and vomiting. These problems can cause a severe loss of body fluids (dehydration) and a body salt (electrolyte) imbalance. Other symptoms may include:  Fever.  Headache.  Fatigue.  Abdominal pain. DIAGNOSIS  Your caregiver can usually diagnose viral gastroenteritis based on your symptoms and a physical exam. A stool sample may also be taken to test for the presence of viruses or other infections. TREATMENT  This illness typically goes away on its own. Treatments are aimed at rehydration. The most serious cases of viral gastroenteritis involve vomiting so severely that you are not able to keep fluids down. In these cases, fluids must be given through an intravenous line (IV). HOME CARE INSTRUCTIONS   Drink enough fluids to keep your  urine clear or pale yellow. Drink small amounts of fluids frequently and increase the amounts as tolerated.  Ask your caregiver for specific rehydration  instructions.  Avoid:  Foods high in sugar.  Alcohol.  Carbonated drinks.  Tobacco.  Juice.  Caffeine drinks.  Extremely hot or cold fluids.  Fatty, greasy foods.  Too much intake of anything at one time.  Dairy products until 24 to 48 hours after diarrhea stops.  You may consume probiotics. Probiotics are active cultures of beneficial bacteria. They may lessen the amount and number of diarrheal stools in adults. Probiotics can be found in yogurt with active cultures and in supplements.  Wash your hands well to avoid spreading the virus.  Only take over-the-counter or prescription medicines for pain, discomfort, or fever as directed by your caregiver. Do not give aspirin to children. Antidiarrheal medicines are not recommended.  Ask your caregiver if you should continue to take your regular prescribed and over-the-counter medicines.  Keep all follow-up appointments as directed by your caregiver. SEEK IMMEDIATE MEDICAL CARE IF:   You are unable to keep fluids down.  You do not urinate at least once every 6 to 8 hours.  You develop shortness of breath.  You notice blood in your stool or vomit. This may look like coffee grounds.  You have abdominal pain that increases or is concentrated in one small area (localized).  You have persistent vomiting or diarrhea.  You have a fever.  The patient is a child younger than 3 months, and he or she has a fever.  The patient is a child older than 3 months, and he or she has a fever and persistent symptoms.  The patient is a child older than 3 months, and he or she has a fever and symptoms suddenly get worse.  The patient is a baby, and he or she has no tears when crying. MAKE SURE YOU:   Understand these instructions.  Will watch your condition.  Will get help right away if you are not doing well or get worse. Document Released: 06/28/2005 Document Revised: 09/20/2011 Document Reviewed: 04/14/2011 [facility] Patient  Information 2013 [place], [place].   RESOURCE GUIDE  Dental Problems  Patients with Medicaid: [facility] [phone]                                           [id number] [employer] Phone:  [phone]                                                  Phone:  [phone]  If unable to pay or uninsured, contact:  [facility] or [facility]. to become qualified for the adult dental clinic.  Chronic Pain Problems Contact [facility]  [phone] Patients need to be referred by their primary care doctor.  Insufficient Money for Medicine Contact [employer]:  call "211" or [facility] [phone].  No Primary Care Doctor Call [facility]  [phone] Other agencies that provide inexpensive medical care    [facility]  [phone]  [facility]  [phone]    [facility]  [phone]    [facility]  [phone]    [facility]anned [facility]  3230219579    [facility]  [phone]  Psychological Services [facility]  [phone] [facility]  [phone] [facility]   [phone] (emergency services [phone])  Substance Abuse Resources [facility]  9541893097 [facility] [phone] [facility] [phone] Floydene Flock [phone] Residential & Outpatient Substance Abuse Program  4024539435  Abuse/Neglect [facility] Child Abuse Hotline [phone] [facility] Child Abuse Hotline [phone] (After Hours)  Emergency Shelter [facility] [phone]  Maternity Homes [facility] [place] [facility] [phone] [facility] [phone]  MRSA Hotline #:   3305606070    [facility] Resources  [facility]     [employer]                          [facility] Dept. [address] [place]                        [address]      [address]  Blondell Reveal Phone:  [phone]                                   Phone:  [phone]                 Phone:  [phone]  [facility] Phone:  [phone]  [facility] Child Abuse Hotline 475-231-3654 [phone] (After Hours)

## 2012-08-11 ENCOUNTER — Other Ambulatory Visit: Payer: Self-pay | Admitting: Internal Medicine

## 2012-08-11 DIAGNOSIS — K3189 Other diseases of stomach and duodenum: Secondary | ICD-10-CM

## 2012-08-12 HISTORY — PX: COLONOSCOPY W/ BIOPSIES: SHX1374

## 2012-10-06 ENCOUNTER — Emergency Department (HOSPITAL_COMMUNITY): Payer: Medicaid Other

## 2012-10-06 ENCOUNTER — Observation Stay (HOSPITAL_COMMUNITY)
Admission: EM | Admit: 2012-10-06 | Discharge: 2012-10-07 | Disposition: A | Discharge status: Home / Self Care | Payer: Medicaid Other | Attending: Internal Medicine | Admitting: Internal Medicine

## 2012-10-06 ENCOUNTER — Encounter (HOSPITAL_COMMUNITY): Payer: Self-pay | Admitting: Emergency Medicine

## 2012-10-06 DIAGNOSIS — K5010 Crohn's disease of large intestine without complications: Secondary | ICD-10-CM

## 2012-10-06 DIAGNOSIS — R197 Diarrhea, unspecified: Secondary | ICD-10-CM

## 2012-10-06 DIAGNOSIS — R1031 Right lower quadrant pain: Secondary | ICD-10-CM | POA: Insufficient documentation

## 2012-10-06 DIAGNOSIS — K5090 Crohn's disease, unspecified, without complications: Secondary | ICD-10-CM | POA: Insufficient documentation

## 2012-10-06 DIAGNOSIS — E860 Dehydration: Secondary | ICD-10-CM | POA: Diagnosis present

## 2012-10-06 DIAGNOSIS — E876 Hypokalemia: Secondary | ICD-10-CM

## 2012-10-06 DIAGNOSIS — K529 Noninfective gastroenteritis and colitis, unspecified: Secondary | ICD-10-CM | POA: Diagnosis present

## 2012-10-06 DIAGNOSIS — K5289 Other specified noninfective gastroenteritis and colitis: Principal | ICD-10-CM | POA: Insufficient documentation

## 2012-10-06 DIAGNOSIS — N898 Other specified noninflammatory disorders of vagina: Secondary | ICD-10-CM | POA: Insufficient documentation

## 2012-10-06 DIAGNOSIS — R112 Nausea with vomiting, unspecified: Secondary | ICD-10-CM

## 2012-10-06 LAB — COMPREHENSIVE METABOLIC PANEL
ALT: 9 U/L (ref 0–35)
CO2: 28 mEq/L (ref 19–32)
Calcium: 8.6 mg/dL (ref 8.4–10.5)
Chloride: 103 mEq/L (ref 96–112)
Creatinine, Ser: 0.74 mg/dL (ref 0.50–1.10)
GFR calc Af Amer: >90 mL/min (ref >90)
GFR calc non Af Amer: >90 mL/min (ref >90)
Glucose, Bld: 189 mg/dL — ABNORMAL HIGH (ref 70–99)
Sodium: 138 mEq/L (ref 135–145)
Total Bilirubin: 0.2 mg/dL — ABNORMAL LOW (ref 0.3–1.2)

## 2012-10-06 LAB — CBC
Hemoglobin: 11.7 g/dL — ABNORMAL LOW (ref 12.0–15.0)
MCH: 31.2 pg (ref 26.0–34.0)
MCV: 93.1 fL (ref 78.0–100.0)
RBC: 3.75 MIL/uL — ABNORMAL LOW (ref 3.87–5.11)
WBC: 11.4 10*3/uL — ABNORMAL HIGH (ref 4.0–10.5)

## 2012-10-06 LAB — URINALYSIS, DIPSTICK ONLY
Glucose, UA: 100 mg/dL — AB
Ketones, ur: NEGATIVE mg/dL
Leukocytes, UA: NEGATIVE
Nitrite: NEGATIVE
Protein, ur: NEGATIVE mg/dL

## 2012-10-06 MED ORDER — CIPROFLOXACIN IN D5W 400 MG/200ML IV SOLN
400.0000 mg | Freq: Two times a day (BID) | INTRAVENOUS | Status: DC
  Administered 2012-10-06 – 2012-10-07 (×3): 400 mg via INTRAVENOUS
  Filled 2012-10-06 (×4): qty 200

## 2012-10-06 MED ORDER — POTASSIUM CHLORIDE 20 MEQ/15ML (10%) PO LIQD
40.0000 meq | Freq: Once | ORAL | Status: AC
  Administered 2012-10-06: 40 meq via ORAL
  Filled 2012-10-06: qty 30

## 2012-10-06 MED ORDER — METRONIDAZOLE IN NACL 5-0.79 MG/ML-% IV SOLN
500.0000 mg | Freq: Three times a day (TID) | INTRAVENOUS | Status: DC
  Administered 2012-10-06 – 2012-10-07 (×3): 500 mg via INTRAVENOUS
  Filled 2012-10-06 (×6): qty 100

## 2012-10-06 MED ORDER — ONDANSETRON HCL 4 MG/2ML IJ SOLN
4.0000 mg | Freq: Once | INTRAMUSCULAR | Status: AC
  Administered 2012-10-06: 4 mg via INTRAVENOUS
  Filled 2012-10-06: qty 2

## 2012-10-06 MED ORDER — IOHEXOL 300 MG/ML  SOLN
100.0000 mL | Freq: Once | INTRAMUSCULAR | Status: AC | PRN
  Administered 2012-10-06: 100 mL via INTRAVENOUS

## 2012-10-06 MED ORDER — DIPHENHYDRAMINE HCL 50 MG/ML IJ SOLN
25.0000 mg | Freq: Once | INTRAMUSCULAR | Status: AC
  Administered 2012-10-06: 25 mg via INTRAVENOUS
  Filled 2012-10-06: qty 1

## 2012-10-06 MED ORDER — SODIUM CHLORIDE 0.9 % IV SOLN: INTRAVENOUS | Status: DC

## 2012-10-06 MED ORDER — ONDANSETRON HCL 4 MG PO TABS: 4.0000 mg | ORAL_TABLET | Freq: Four times a day (QID) | ORAL | Status: DC | PRN

## 2012-10-06 MED ORDER — MORPHINE SULFATE 4 MG/ML IJ SOLN
4.0000 mg | Freq: Once | INTRAMUSCULAR | Status: AC
  Administered 2012-10-06: 4 mg via INTRAVENOUS
  Filled 2012-10-06: qty 1

## 2012-10-06 MED ORDER — SODIUM CHLORIDE 0.9 % IV BOLUS (SEPSIS)
1000.0000 mL | Freq: Once | INTRAVENOUS | Status: AC
  Administered 2012-10-06: 1000 mL via INTRAVENOUS

## 2012-10-06 MED ORDER — METHYLPREDNISOLONE SODIUM SUCC 125 MG IJ SOLR
60.0000 mg | Freq: Three times a day (TID) | INTRAMUSCULAR | Status: DC
  Administered 2012-10-06 – 2012-10-07 (×3): 60 mg via INTRAVENOUS
  Filled 2012-10-06 (×5): qty 0.96

## 2012-10-06 MED ORDER — POTASSIUM CHLORIDE 10 MEQ/100ML IV SOLN
10.0000 meq | INTRAVENOUS | Status: DC
  Administered 2012-10-06: 10 meq via INTRAVENOUS
  Filled 2012-10-06 (×3): qty 100

## 2012-10-06 MED ORDER — ENOXAPARIN SODIUM 40 MG/0.4ML ~~LOC~~ SOLN
40.0000 mg | SUBCUTANEOUS | Status: DC
  Administered 2012-10-06: 40 mg via SUBCUTANEOUS
  Filled 2012-10-06 (×2): qty 0.4

## 2012-10-06 MED ORDER — IOHEXOL 300 MG/ML  SOLN
25.0000 mL | INTRAMUSCULAR | Status: AC
  Administered 2012-10-06: 25 mL via ORAL

## 2012-10-06 MED ORDER — ONDANSETRON HCL 4 MG/2ML IJ SOLN
4.0000 mg | Freq: Four times a day (QID) | INTRAMUSCULAR | Status: DC | PRN
  Administered 2012-10-06 (×2): 4 mg via INTRAVENOUS
  Filled 2012-10-06 (×2): qty 2

## 2012-10-06 MED ORDER — MORPHINE SULFATE 2 MG/ML IJ SOLN
2.0000 mg | INTRAMUSCULAR | Status: DC | PRN
  Administered 2012-10-06 – 2012-10-07 (×4): 2 mg via INTRAVENOUS
  Filled 2012-10-06 (×4): qty 1

## 2012-10-06 MED ORDER — FLUCONAZOLE 150 MG PO TABS
150.0000 mg | ORAL_TABLET | Freq: Once | ORAL | Status: AC
  Administered 2012-10-06: 150 mg via ORAL
  Filled 2012-10-06: qty 1

## 2012-10-06 MED ORDER — DEXTROSE 50 % IV SOLN: 1.0000 | Freq: Once | INTRAVENOUS | Status: DC

## 2012-10-06 MED ORDER — METOCLOPRAMIDE HCL 5 MG/ML IJ SOLN
10.0000 mg | Freq: Once | INTRAMUSCULAR | Status: AC
  Administered 2012-10-06: 10 mg via INTRAVENOUS
  Filled 2012-10-06: qty 2

## 2012-10-06 NOTE — H&P (Signed)
[facility] History and Physical  Melanie [name] ZOX:[id number] DOB: [birth year] DOA: 10/06/2012  PCP: Pcp Not In System  Specialists: Gastroenterology Deboraha Sprang)  Chief Complaint: abdominal pain, nausea/vomiting/diarrhea  HPI: Melanie [name] is a 27 y.o. female has a past medical history significant for Crohn's disease diagnosed this year (followed by [facility] as an outpatient, Dr. Randa Evens) presents with the above complaints that started last night. She is unable to tolerate po intake. Denies frank fevers but has chills. She was recently started on Pentasa, she reports compliance. Denies hematochezia. She is feeling weak and lightheaded. Denies palpitations/chest pain.   Review of Systems: as per HPI otherwise negative  Past Medical History  Diagnosis Date  . Anemia   . Gestational diabetes mellitus in pregnancy   . Cardiac tumor    Past Surgical History  Procedure Laterality Date  . Cardiac surgery      at 27 years old   Social History:  reports that she has been smoking.  She has never used smokeless tobacco. She reports that  drinks alcohol. She reports that she uses illicit drugs (Marijuana) about once per week.  No Known Allergies  Family History  Problem Relation Age of Onset  . Hypertension Mother     Prior to Admission medications   Medication Sig Start Date End Date Taking? Authorizing Provider  promethazine (PHENERGAN) 25 MG tablet Take 1 tablet (25 mg total) by mouth every 6 (six) hours as needed for nausea. 08/06/12  Yes Roxy Horseman, PA-C   Physical Exam: Filed Vitals:   10/06/12 0147 10/06/12 0541 10/06/12 0630  BP: 155/104 147/82 142/76  Pulse: 63 56 57  Temp: 98.4 F (36.9 C)    TempSrc: Oral    Resp: 18 14 14   SpO2: 98% 98% 100%     General:  In pain  Eyes: PERRL, EOMI  ENT: moist oropharynx  Neck: supple, no JVD  Cardiovascular: RRR without MRG   Respiratory: CTA biL, good air movement without wheezing, rhonchi or  crackled  Abdomen: soft, mild tenderness to palpation throughout  Skin: no rashes  Musculoskeletal: no peripheral edema  Psychiatric: normal mood and affect  Neurologic: CN 2-12 grossly intact, MS 5/5 in all 4  Labs on Admission:  Basic Metabolic Panel:  Recent Labs Lab 10/06/12 0311  NA 138  K 3.2*  CL 103  CO2 28  GLUCOSE 189*  BUN 8  CREATININE 0.74  CALCIUM 8.6   Liver Function Tests:  Recent Labs Lab 10/06/12 0311  AST 17  ALT 9  ALKPHOS 68  BILITOT 0.2*  PROT 6.3  ALBUMIN 3.2*   CBC:  Recent Labs Lab 10/06/12 0311  WBC 11.4*  HGB 11.7*  HCT 34.9*  MCV 93.1  PLT 240   Radiological Exams on Admission: Ct Abdomen Pelvis W Contrast  10/06/2012  *RADIOLOGY REPORT*  Clinical Data: Abdominal pain.  Crohn's disease.  CT ABDOMEN AND PELVIS WITH CONTRAST  Technique:  Multidetector CT imaging of the abdomen and pelvis was performed following the standard protocol during bolus administration of intravenous contrast.  Contrast: OMNIPAQUE IOHEXOL 300 MG/ML  SOLN  Comparison: 06/13/2012.  Findings: Lung Bases: Dependent atelectasis.  Liver:  Mild periportal edema.  No focal mass lesions.  Small cysts are present in the anterior left hepatic lobe which are unchanged compared to prior.  Spleen:  Normal.  Gallbladder:  Single tiny calcified gallstone layers dependently (image number 38 series 2).  Common bile duct:  Normal.  Pancreas:  Normal.  Adrenal glands:  Normal.  Kidneys:  Normal. Ureters appear within normal limits.  Stomach:  Decompressed, with contrast opacifying the folds.  Small bowel:  Duodenum appears normal.  Proximal small bowel appears normal.  Fluid fills the distal small bowel.  There is no obstruction or bowel dilation.  Suspect mild segmental thickening of the ileum in the anatomic pelvis (image number 61).  Avid mural enhancement and mural thickening of the terminal ileum extending up to the ileocecal valve.  Colon:   Inflammatory changes with mural  thickening of the ascending colon and proximal transverse colon.  This continues into the descending colon and sigmoid.  The rectum appears within normal limits.  Pelvic Genitourinary:  Small amount of ascites, likely reactive. Uterus and adnexa appear within normal limits.  Urinary bladder normal.  Bones:  No aggressive osseous lesions.  SI joints appear within normal limits.  Sacralization of the right L5 transverse process.  Vasculature: Grossly normal.  IMPRESSION: 1.  Segmental ileal mural thickening and marked inflammation of the terminal ileum extending to the ileocecal valve.  Diffuse colitis. Both findings are compatible with exacerbation in this patient with history of Crohn's disease.  No perforation, obstruction, or abscess. 2.  Single tiny calcified gallstone layering dependently.  No CT evidence of acute cholecystitis. 3.  Periportal edema in the liver. 4.  Reactive ascites in the anatomic pelvis.   Original Report Authenticated By: Andreas Newport, M.D.     EKG: Independently reviewed.  Assessment/Plan Active Problems:   Nausea vomiting and diarrhea   Dehydration   Acute colitis   Hypokalemia  Abdominal pain - secondary to Crohn's flare - admitted with acute onset nausea/vomitnig diarrhea starting last night. On IV fluids, pain control and empiric antibiotics. CT scan concerning for Crohn's disease, [facility] consulted and appreciate their help. - on iv steroids per gi as well.  Vaginal discharge - for the past 2 weeks, was diagnosed with fungal infection but never received treatment, persistent - Diflucan x 1 dose.    Hypokalemia - repletion, repeat BMP in am.  DVT prophylaxis - Lovenox s.q.  Code Status: Presumed full  Family Communication: none  Disposition Plan: to be determined  Time spent: 79  Bayan Kushnir M. Elvera Lennox, MD [facility] Pager [phone]  If 7PM-7AM, please contact night-coverage [email or web address] Password [email or web address] 10/06/2012, 11:44 AM

## 2012-10-06 NOTE — Progress Notes (Signed)
Utilization Review Completed.   Mansfield Dann, RN, BSN Nurse Case Manager  [phone]  

## 2012-10-06 NOTE — Progress Notes (Signed)
Patient is 27 year old women asked to be admitted for intractable nause, vomiting and abdominal pain. Has h/o crohn's disease. Mildly elevated white count and hypokalemic. BP 147/82  Pulse 56  Temp(Src) 98.4 F (36.9 C) (Oral)  Resp 14  SpO2 98%  LMP 09/29/2012 Accepted for transfer and admission to [facility] for IVF rehydration and pain control.  Lars Mage MD Faculty-Internal Medicine Residency Program

## 2012-10-06 NOTE — ED Notes (Signed)
Pt brought to ED by EMS with abdominal pain.Pt gives the history of chrons and says it got worse.

## 2012-10-06 NOTE — ED Provider Notes (Addendum)
History     CSN: [id number]  Arrival date & time 10/06/12  0139   First MD Initiated Contact with Patient 10/06/12 0139      Chief Complaint  Patient presents with  . Abdominal Pain    (Consider location/radiation/quality/duration/timing/severity/associated sxs/prior treatment) Patient is a 27 y.o. female presenting with abdominal pain. The history is provided by the patient.  Abdominal Pain Pain location:  RLQ Pain quality: aching   Pain radiates to:  Does not radiate Pain severity:  Severe Onset quality:  Gradual Context comment:  Hx of crohns Relieved by:  Nothing Worsened by:  Nothing tried Ineffective treatments:  None tried Associated symptoms: diarrhea, nausea and vomiting     Past Medical History  Diagnosis Date  . Anemia   . Gestational diabetes mellitus in pregnancy   . Cardiac tumor     Past Surgical History  Procedure Laterality Date  . Cardiac surgery      at 27 years old    Family History  Problem Relation Age of Onset  . Hypertension Mother     History  Substance Use Topics  . Smoking status: Current Some Day Smoker  . Smokeless tobacco: Never Used  . Alcohol Use: Yes     Comment: occasionally    OB History   Grav Para Term Preterm Abortions TAB SAB Ect Mult Living   3 3 2 1      2       Review of Systems  Gastrointestinal: Positive for nausea, vomiting, abdominal pain and diarrhea.    Allergies  Review of patient's allergies indicates no known allergies.  Home Medications   Current Outpatient Rx  Name  Route  Sig  Dispense  Refill  . promethazine (PHENERGAN) 25 MG tablet   Oral   Take 1 tablet (25 mg total) by mouth every 6 (six) hours as needed for nausea.   12 tablet   0     BP 155/104  Pulse 63  Temp(Src) 98.4 F (36.9 C) (Oral)  Resp 18  SpO2 98%  Physical Exam  Constitutional: She is oriented to person, place, and time. She appears well-developed and well-nourished.  HENT:  Head: Normocephalic and  atraumatic.  Eyes: Conjunctivae and EOM are normal. Pupils are equal, round, and reactive to light.  Neck: Normal range of motion.  Cardiovascular: Normal rate, regular rhythm and normal heart sounds.   Pulmonary/Chest: Effort normal and breath sounds normal.  Abdominal: Soft. Bowel sounds are normal.    Musculoskeletal: Normal range of motion.  Neurological: She is alert and oriented to person, place, and time.  Skin: Skin is warm and dry.  Psychiatric: She has a normal mood and affect. Her behavior is normal.    ED Course  Procedures (including critical care time)  Labs Reviewed  CBC  COMPREHENSIVE METABOLIC PANEL  URINALYSIS, DIPSTICK ONLY   No results found.   No diagnosis found.    MDM  Hx of crohns  Pt states is having flare.  Will lab, analgeisa, ivf, reassess  Cont pain/vomiting.  Will admit  Discussed with hospitalist      Taft Worthing Lytle Michaels, MD 10/06/12 1191  Rosanne Ashing, MD 10/06/12 [phone]

## 2012-10-06 NOTE — Consult Note (Addendum)
Referring Provider: Dr. Elvera Lennox Primary Care Physician:  Pcp Not In System Primary Gastroenterologist:  Dr. Randa Evens  Reason for Consultation:  N/V/abdominal pain  HPI: Melanie [name] is a 27 y.o. female diagnosed with small bowel Crohn's disease last month on a colonoscopy by Dr. Randa Evens. She was placed on Pentasa this month, which she reports taking 1 g QID. She came into the hospital due to worsened abdominal pain, persistent vomiting and nausea. Denies any diarrhea. States stools are semi-formed without blood seen. Subjective fevers and chills. Denies hematochezia. Mother at bedside. Pain was sharp that would start in pelvic area and radiate into the periumbilical area. Had dizziness yesterday with the recurrent vomiting. Mother at bedside.    Past Medical History  Diagnosis Date  . Anemia   . Gestational diabetes mellitus in pregnancy   . Cardiac tumor     Past Surgical History  Procedure Laterality Date  . Cardiac surgery      at 27 years old    Prior to Admission medications   Medication Sig Start Date End Date Taking? Authorizing Provider  promethazine (PHENERGAN) 25 MG tablet Take 1 tablet (25 mg total) by mouth every 6 (six) hours as needed for nausea. 08/06/12  Yes Roxy Horseman, PA-C    Scheduled Meds: . ciprofloxacin  400 mg Intravenous Q12H  . enoxaparin (LOVENOX) injection  40 mg Subcutaneous Q24H  . fluconazole  150 mg Oral Once  . metronidazole  500 mg Intravenous Q8H  . potassium chloride  40 mEq Oral Once   Continuous Infusions: . sodium chloride     PRN Meds:.morphine injection, ondansetron (ZOFRAN) IV, ondansetron  Allergies as of 10/06/2012  . (No Known Allergies)    Family History  Problem Relation Age of Onset  . Hypertension Mother     History   Social History  . Marital Status: Single    Spouse Name: N/A    Number of Children: N/A  . Years of Education: N/A   Occupational History  . Not on file.   Social History Main Topics   . Smoking status: Current Some Day Smoker  . Smokeless tobacco: Never Used  . Alcohol Use: Yes     Comment: occasionally  . Drug Use: 1.00 per week    Special: Marijuana     Comment: states she smoked 2 weeks ago when she was overly stressed, none since then.  . Sexually Active: Yes    Birth Control/ Protection: None   Other Topics Concern  . Not on file   Social History Narrative  . No narrative on file    Review of Systems: All negative except as stated above in HPI.  Physical Exam: Vital signs: Filed Vitals:   10/06/12 0630  BP: 142/76  Pulse: 57  Temp:   Resp: 14   Last BM Date: 10/05/12 General:   Lethargic, thin, no acute distress HEENT: anicteric, oropharynx clear Neck: supple, nontender Lungs:  Clear throughout to auscultation.   No wheezes, crackles, or rhonchi. No acute distress. Heart:  Regular rate and rhythm; no murmurs, clicks, rubs,  or gallops. Abdomen: diffusely tender with guarding, abdominal striations, nondistended, soft, +BS  Rectal:  Deferred Ext: no edema  GI:  Lab Results:  Recent Labs  10/06/12 0311  WBC 11.4*  HGB 11.7*  HCT 34.9*  PLT 240   BMET  Recent Labs  10/06/12 0311  NA 138  K 3.2*  CL 103  CO2 28  GLUCOSE 189*  BUN 8  CREATININE 0.74  CALCIUM 8.6   LFT  Recent Labs  10/06/12 0311  PROT 6.3  ALBUMIN 3.2*  AST 17  ALT 9  ALKPHOS 68  BILITOT 0.2*   PT/INR No results found for this basename: LABPROT, INR,  in the last 72 hours   Studies/Results: Ct Abdomen Pelvis W Contrast  10/06/2012  *RADIOLOGY REPORT*  Clinical Data: Abdominal pain.  Crohn's disease.  CT ABDOMEN AND PELVIS WITH CONTRAST  Technique:  Multidetector CT imaging of the abdomen and pelvis was performed following the standard protocol during bolus administration of intravenous contrast.  Contrast: OMNIPAQUE IOHEXOL 300 MG/ML  SOLN  Comparison: 06/13/2012.  Findings: Lung Bases: Dependent atelectasis.  Liver:  Mild periportal edema.   No focal mass lesions.  Small cysts are present in the anterior left hepatic lobe which are unchanged compared to prior.  Spleen:  Normal.  Gallbladder:  Single tiny calcified gallstone layers dependently (image number 38 series 2).  Common bile duct:  Normal.  Pancreas:  Normal.  Adrenal glands:  Normal.  Kidneys:  Normal. Ureters appear within normal limits.  Stomach:  Decompressed, with contrast opacifying the folds.  Small bowel:  Duodenum appears normal.  Proximal small bowel appears normal.  Fluid fills the distal small bowel.  There is no obstruction or bowel dilation.  Suspect mild segmental thickening of the ileum in the anatomic pelvis (image number 61).  Avid mural enhancement and mural thickening of the terminal ileum extending up to the ileocecal valve.  Colon:   Inflammatory changes with mural thickening of the ascending colon and proximal transverse colon.  This continues into the descending colon and sigmoid.  The rectum appears within normal limits.  Pelvic Genitourinary:  Small amount of ascites, likely reactive. Uterus and adnexa appear within normal limits.  Urinary bladder normal.  Bones:  No aggressive osseous lesions.  SI joints appear within normal limits.  Sacralization of the right L5 transverse process.  Vasculature: Grossly normal.  IMPRESSION: 1.  Segmental ileal mural thickening and marked inflammation of the terminal ileum extending to the ileocecal valve.  Diffuse colitis. Both findings are compatible with exacerbation in this patient with history of Crohn's disease.  No perforation, obstruction, or abscess. 2.  Single tiny calcified gallstone layering dependently.  No CT evidence of acute cholecystitis. 3.  Periportal edema in the liver. 4.  Reactive ascites in the anatomic pelvis.   Original Report Authenticated By: Andreas Newport, M.D.     Impression/Plan: 27 yo with newly diagnosed Crohn's ileitis on colonoscopy last month presenting with persistent vomiting and constant  abdominal pain. CT reviewed and symptoms concerning for obstructive symptoms from her Crohn's vs. Gastroenteritis in the setting of Crohn's. Extensive inflammation noted on CT in terminal ileum and diffusely in colon. Initially I considered Abx and supportive care but in light of these CT findings will start IV solumedrol. Hold off on Pentasa at this time. May need a biologic in the future if symptoms fail to respond to steroids or if it is determined that she is steroid dependent as an outpt. Clear liquids only. Continue IV Cipro/Flagyl for now. Dr. Ewing Schlein to see tomorrow.    LOS: 0 days   Rufus Cypert C.  10/06/2012, 1:20 PM

## 2012-10-06 NOTE — ED Notes (Signed)
Pt denies any nausea or pain at this time. Pt informed to drink fluids for CT testing and urine sample needed.

## 2012-10-06 NOTE — ED Notes (Signed)
Report given to [facility]

## 2012-10-06 NOTE — ED Notes (Signed)
Pt presented with abdominal pain and received Zofron 4mg  by EMS at [phone]

## 2012-10-07 LAB — BASIC METABOLIC PANEL
CO2: 22 mEq/L (ref 19–32)
Calcium: 9.3 mg/dL (ref 8.4–10.5)
Chloride: 103 mEq/L (ref 96–112)
Glucose, Bld: 149 mg/dL — ABNORMAL HIGH (ref 70–99)
Potassium: 3.6 mEq/L (ref 3.5–5.1)
Sodium: 138 mEq/L (ref 135–145)

## 2012-10-07 MED ORDER — TRAZODONE 25 MG HALF TABLET: 25.0000 mg | ORAL_TABLET | Freq: Every day | ORAL | Status: DC

## 2012-10-07 MED ORDER — PROMETHAZINE HCL 25 MG PO TABS: 25.0000 mg | ORAL_TABLET | Freq: Four times a day (QID) | ORAL | Status: DC | PRN

## 2012-10-07 MED ORDER — CIPROFLOXACIN HCL 500 MG PO TABS: 500.0000 mg | ORAL_TABLET | Freq: Two times a day (BID) | ORAL | Status: DC

## 2012-10-07 MED ORDER — METRONIDAZOLE 500 MG PO TABS: 500.0000 mg | ORAL_TABLET | Freq: Three times a day (TID) | ORAL | Status: DC

## 2012-10-07 MED ORDER — PREDNISONE 20 MG PO TABS: 40.0000 mg | ORAL_TABLET | Freq: Every day | ORAL | Status: DC

## 2012-10-07 NOTE — Progress Notes (Signed)
UR completed 

## 2012-10-07 NOTE — Discharge Summary (Signed)
Physician Discharge Summary  Melanie [name] Melanie [name]:[id number] DOB: 20-Jun-1986 DOA: 10/06/2012  PCP: Pcp Not In System  Admit date: 10/06/2012 Discharge date: 10/07/2012  Time spent: 40 minutes  Recommendations for Outpatient Follow-up:  1. Please followup with gastroenterology in one to 2 weeks  Discharge Diagnoses:  Active Problems:   Nausea vomiting and diarrhea   Dehydration   Acute colitis   Hypokalemia  Discharge Condition: Stable  Diet recommendation: Lactose-free  There were no vitals filed for this visit.  History of present illness:  Melanie [name] is a 27 y.o. female has a past medical history significant for Crohn's disease diagnosed this year (followed by [facility] as an outpatient, Dr. Randa Evens) presents with the above complaints that started last night. She is unable to tolerate po intake. Denies frank fevers but has chills. She was recently started on Pentasa, she reports compliance. Denies hematochezia. She is feeling weak and lightheaded. Denies palpitations/chest pain.   Hospital Course:  Abdominal pain - secondary to Crohn's flare as evident in the CT scan of the abdomen. Gastroenterology has been consulted, and per their recommendations patient was started on IV steroids and supportive treatment with antibiotics. Her flare improved dramatically after the treatment initiation, and on hospital day 2 patient had no abdominal pain, no diarrhea, and was able to tolerate by mouth intake. She was discharged to followup with gastroenterology in one week. She was discharged on by mouth ciprofloxacin and by mouth metronidazole as well as prednisone 40 mg per day for 7 days then 30 mg per day until she is seen by Dr. Randa Evens.  Vaginal discharge - for the past 2 weeks, was diagnosed with fungal infection but never received treatment, persistent. Diflucan x 1 dose.  Hypokalemia - secondary to GI losses, improved after  repletion   Procedures:  none  Consultations:  GI  Discharge Exam: Filed Vitals:   10/06/12 1506 10/06/12 2105 10/07/12 0620 10/07/12 1339  BP: 98/48 103/45 113/54 117/67  Pulse: 58 61 51 91  Temp: 98.9 F (37.2 C) 98.6 F (37 C) 97.2 F (36.2 C) 98.7 F (37.1 C)  TempSrc:  Oral Oral Oral  Resp: 16 16 16 18   SpO2: 100% 100% 98% 99%   General: NAD Cardiovascular: RRR without MRG Respiratory: no wheezing or rhonchi, good air movement  Discharge Instructions    Medication List    TAKE these medications       ciprofloxacin 500 MG tablet  Commonly known as:  CIPRO  Take 1 tablet (500 mg total) by mouth 2 (two) times daily.     metroNIDAZOLE 500 MG tablet  Commonly known as:  FLAGYL  Take 1 tablet (500 mg total) by mouth 3 (three) times daily.     predniSONE 20 MG tablet  Commonly known as:  DELTASONE  Take 2 tablets (40 mg total) by mouth daily. 40 mg daily for 1 week then 30 mg daily 1 and a half tabs until seen by Dr. Randa Evens     promethazine 25 MG tablet  Commonly known as:  PHENERGAN  Take 1 tablet (25 mg total) by mouth every 6 (six) hours as needed for nausea.     traZODone 25 mg Tabs  Commonly known as:  DESYREL  Take 0.5 tablets (25 mg total) by mouth at bedtime.           Follow-up Information   Follow up with EDWARDS JR,JAMES L, MD In 1 week.   Contact information:   [address]  [address]                         [place] [address] [phone]      The results of significant diagnostics from this hospitalization (including imaging, microbiology, ancillary and laboratory) are listed below for reference.    Significant Diagnostic Studies: Ct Abdomen Pelvis W Contrast  10/06/2012  *RADIOLOGY REPORT*  Clinical Data: Abdominal pain.  Crohn's disease.  CT ABDOMEN AND PELVIS WITH CONTRAST  Technique:  Multidetector CT imaging of the abdomen and pelvis was performed following the standard protocol during bolus administration  of intravenous contrast.  Contrast: OMNIPAQUE IOHEXOL 300 MG/ML  SOLN  Comparison: 06/13/2012.  Findings: Lung Bases: Dependent atelectasis.  Liver:  Mild periportal edema.  No focal mass lesions.  Small cysts are present in the anterior left hepatic lobe which are unchanged compared to prior.  Spleen:  Normal.  Gallbladder:  Single tiny calcified gallstone layers dependently (image number 38 series 2).  Common bile duct:  Normal.  Pancreas:  Normal.  Adrenal glands:  Normal.  Kidneys:  Normal. Ureters appear within normal limits.  Stomach:  Decompressed, with contrast opacifying the folds.  Small bowel:  Duodenum appears normal.  Proximal small bowel appears normal.  Fluid fills the distal small bowel.  There is no obstruction or bowel dilation.  Suspect mild segmental thickening of the ileum in the anatomic pelvis (image number 61).  Avid mural enhancement and mural thickening of the terminal ileum extending up to the ileocecal valve.  Colon:   Inflammatory changes with mural thickening of the ascending colon and proximal transverse colon.  This continues into the descending colon and sigmoid.  The rectum appears within normal limits.  Pelvic Genitourinary:  Small amount of ascites, likely reactive. Uterus and adnexa appear within normal limits.  Urinary bladder normal.  Bones:  No aggressive osseous lesions.  SI joints appear within normal limits.  Sacralization of the right L5 transverse process.  Vasculature: Grossly normal.  IMPRESSION: 1.  Segmental ileal mural thickening and marked inflammation of the terminal ileum extending to the ileocecal valve.  Diffuse colitis. Both findings are compatible with exacerbation in this patient with history of Crohn's disease.  No perforation, obstruction, or abscess. 2.  Single tiny calcified gallstone layering dependently.  No CT evidence of acute cholecystitis. 3.  Periportal edema in the liver. 4.  Reactive ascites in the anatomic pelvis.   Original Report  Authenticated By: Andreas Newport, M.D.    Labs: Basic Metabolic Panel:  Recent Labs Lab 10/06/12 0311 10/07/12 0630  NA 138 138  K 3.2* 3.6  CL 103 103  CO2 28 22  GLUCOSE 189* 149*  BUN 8 6  CREATININE 0.74 0.65  CALCIUM 8.6 9.3   Liver Function Tests:  Recent Labs Lab 10/06/12 0311  AST 17  ALT 9  ALKPHOS 68  BILITOT 0.2*  PROT 6.3  ALBUMIN 3.2*   CBC:  Recent Labs Lab 10/06/12 0311  WBC 11.4*  HGB 11.7*  HCT 34.9*  MCV 93.1  PLT 240      Signed:  GHERGHE, COSTIN  [facility] 10/07/2012, 1:43 PM

## 2013-07-12 NOTE — L&D Delivery Note (Signed)
Pt was admitted in early labor. She rapidly completed the first stage. She pushed x 1 and had a SVD of one live viable black infant over an intact perineum. She was GBS + and did not have adequate time to administer Ampicillin. Placenta S/I. EBL-400cc. Baby to NBN.

## 2013-07-17 ENCOUNTER — Emergency Department (HOSPITAL_COMMUNITY): Payer: Medicaid Other

## 2013-07-17 ENCOUNTER — Emergency Department (HOSPITAL_COMMUNITY)
Admission: EM | Admit: 2013-07-17 | Discharge: 2013-07-17 | Disposition: A | Discharge status: Home / Self Care | Payer: Medicaid Other | Attending: Emergency Medicine | Admitting: Emergency Medicine

## 2013-07-17 ENCOUNTER — Encounter (HOSPITAL_COMMUNITY): Payer: Self-pay | Admitting: Emergency Medicine

## 2013-07-17 DIAGNOSIS — Z9889 Other specified postprocedural states: Secondary | ICD-10-CM | POA: Insufficient documentation

## 2013-07-17 DIAGNOSIS — Z8719 Personal history of other diseases of the digestive system: Secondary | ICD-10-CM | POA: Insufficient documentation

## 2013-07-17 DIAGNOSIS — O99330 Smoking (tobacco) complicating pregnancy, unspecified trimester: Secondary | ICD-10-CM | POA: Insufficient documentation

## 2013-07-17 DIAGNOSIS — O210 Mild hyperemesis gravidarum: Secondary | ICD-10-CM | POA: Insufficient documentation

## 2013-07-17 DIAGNOSIS — Z8679 Personal history of other diseases of the circulatory system: Secondary | ICD-10-CM | POA: Insufficient documentation

## 2013-07-17 DIAGNOSIS — Z8632 Personal history of gestational diabetes: Secondary | ICD-10-CM | POA: Insufficient documentation

## 2013-07-17 DIAGNOSIS — Z3490 Encounter for supervision of normal pregnancy, unspecified, unspecified trimester: Secondary | ICD-10-CM

## 2013-07-17 DIAGNOSIS — O9989 Other specified diseases and conditions complicating pregnancy, childbirth and the puerperium: Secondary | ICD-10-CM | POA: Insufficient documentation

## 2013-07-17 DIAGNOSIS — R1031 Right lower quadrant pain: Secondary | ICD-10-CM | POA: Insufficient documentation

## 2013-07-17 DIAGNOSIS — Z862 Personal history of diseases of the blood and blood-forming organs and certain disorders involving the immune mechanism: Secondary | ICD-10-CM | POA: Insufficient documentation

## 2013-07-17 DIAGNOSIS — R109 Unspecified abdominal pain: Secondary | ICD-10-CM

## 2013-07-17 HISTORY — DX: Crohn's disease, unspecified, without complications: K50.90

## 2013-07-17 LAB — CBC WITH DIFFERENTIAL/PLATELET
BASOS ABS: 0 10*3/uL (ref 0.0–0.1)
BASOS PCT: 0 % (ref 0–1)
Eosinophils Absolute: 0.1 10*3/uL (ref 0.0–0.7)
Eosinophils Relative: 1 % (ref 0–5)
HCT: 37.5 % (ref 36.0–46.0)
Hemoglobin: 12.6 g/dL (ref 12.0–15.0)
LYMPHS PCT: 35 % (ref 12–46)
Lymphs Abs: 2.5 10*3/uL (ref 0.7–4.0)
MCH: 30.5 pg (ref 26.0–34.0)
MCHC: 33.6 g/dL (ref 30.0–36.0)
MCV: 90.8 fL (ref 78.0–100.0)
Monocytes Absolute: 0.7 10*3/uL (ref 0.1–1.0)
Monocytes Relative: 10 % (ref 3–12)
NEUTROS ABS: 3.9 10*3/uL (ref 1.7–7.7)
NEUTROS PCT: 54 % (ref 43–77)
PLATELETS: 292 10*3/uL (ref 150–400)
RBC: 4.13 MIL/uL (ref 3.87–5.11)
RDW: 13.4 % (ref 11.5–15.5)
WBC: 7.1 10*3/uL (ref 4.0–10.5)

## 2013-07-17 LAB — PREGNANCY, URINE: Preg Test, Ur: POSITIVE — AB

## 2013-07-17 LAB — BASIC METABOLIC PANEL
BUN: 7 mg/dL (ref 6–23)
CHLORIDE: 101 meq/L (ref 96–112)
CO2: 19 mEq/L (ref 19–32)
CREATININE: 0.63 mg/dL (ref 0.50–1.10)
Calcium: 9.2 mg/dL (ref 8.4–10.5)
GFR calc non Af Amer: >90 mL/min (ref >90)
Glucose, Bld: 158 mg/dL — ABNORMAL HIGH (ref 70–99)
POTASSIUM: 3 meq/L — AB (ref 3.7–5.3)
SODIUM: 138 meq/L (ref 137–147)

## 2013-07-17 LAB — WET PREP, GENITAL
Clue Cells Wet Prep HPF POC: NONE SEEN
TRICH WET PREP: NONE SEEN
Yeast Wet Prep HPF POC: NONE SEEN

## 2013-07-17 LAB — URINE MICROSCOPIC-ADD ON

## 2013-07-17 LAB — HCG, QUANTITATIVE, PREGNANCY: HCG, BETA CHAIN, QUANT, S: 531 m[IU]/mL — AB (ref <5)

## 2013-07-17 LAB — URINALYSIS, ROUTINE W REFLEX MICROSCOPIC
Bilirubin Urine: NEGATIVE
Glucose, UA: NEGATIVE mg/dL
Hgb urine dipstick: NEGATIVE
Ketones, ur: NEGATIVE mg/dL
LEUKOCYTES UA: NEGATIVE
NITRITE: NEGATIVE
PH: 7 (ref 5.0–8.0)
Protein, ur: 30 mg/dL — AB
Specific Gravity, Urine: 1.025 (ref 1.005–1.030)
Urobilinogen, UA: 1 mg/dL (ref 0.0–1.0)

## 2013-07-17 MED ORDER — SODIUM CHLORIDE 0.9 % IV BOLUS (SEPSIS)
1000.0000 mL | Freq: Once | INTRAVENOUS | Status: AC
  Administered 2013-07-17: 1000 mL via INTRAVENOUS

## 2013-07-17 MED ORDER — IOHEXOL 300 MG/ML  SOLN
50.0000 mL | Freq: Once | INTRAMUSCULAR | Status: AC | PRN
  Administered 2013-07-17: 50 mL via ORAL

## 2013-07-17 MED ORDER — ONDANSETRON HCL 4 MG/2ML IJ SOLN
INTRAMUSCULAR | Status: AC
Start: 2013-07-17 — End: 2013-07-17
  Administered 2013-07-17: 4 mg
  Filled 2013-07-17: qty 2

## 2013-07-17 MED ORDER — HYDROMORPHONE HCL PF 1 MG/ML IJ SOLN
INTRAMUSCULAR | Status: AC
  Administered 2013-07-17: 1 mg
  Filled 2013-07-17: qty 1

## 2013-07-17 NOTE — ED Notes (Signed)
Bed: WA12 Expected date:  Expected time:  Means of arrival:  Comments: EMS 

## 2013-07-17 NOTE — ED Provider Notes (Signed)
CSN: [id number]     Arrival date & time 07/17/13  1235 History   First MD Initiated Contact with Patient 07/17/13 1303     Chief Complaint  Patient presents with  . Abdominal Pain  . Emesis  . Crohn's Disease   (Consider location/radiation/quality/duration/timing/severity/associated sxs/prior Treatment) HPI Comments: Pt comes in with cc of abd pain and emesis. Pt has hx of crohn's dz. Pain is located in the RLQ and is constant, sharp, similar to her previous crohns attack - no bloody stools. Pt also has had about 3 episodes of emesis. Patient has no fevers or chills. UCG is + for pregnancy, LMP was 2 weeks ago per PCP.  Patient is a 28 y.o. female presenting with abdominal pain and vomiting. The history is provided by the patient.  Abdominal Pain Associated symptoms: nausea and vomiting   Associated symptoms: no chest pain, no diarrhea, no dysuria, no shortness of breath and no vaginal discharge   Emesis Associated symptoms: abdominal pain   Associated symptoms: no diarrhea and no headaches     Past Medical History  Diagnosis Date  . Anemia   . Gestational diabetes mellitus in pregnancy   . Cardiac tumor   . Crohn's disease    Past Surgical History  Procedure Laterality Date  . Cardiac surgery      at 28 years old   Family History  Problem Relation Age of Onset  . Hypertension Mother    History  Substance Use Topics  . Smoking status: Current Some Day Smoker  . Smokeless tobacco: Never Used  . Alcohol Use: Yes     Comment: occasionally   OB History   Grav Para Term Preterm Abortions TAB SAB Ect Mult Living   3 3 2 1      2      Review of Systems  Constitutional: Negative for activity change.  Respiratory: Negative for shortness of breath.   Cardiovascular: Negative for chest pain.  Gastrointestinal: Positive for nausea, vomiting and abdominal pain. Negative for diarrhea and blood in stool.  Genitourinary: Negative for dysuria and vaginal discharge.   Musculoskeletal: Negative for neck pain.  Neurological: Negative for headaches.    Allergies  Review of patient's allergies indicates no known allergies.  Home Medications   Current Outpatient Rx  Name  Route  Sig  Dispense  Refill  . acetaminophen (TYLENOL) 500 MG tablet   Oral   Take 1,000 mg by mouth every 6 (six) hours as needed for moderate pain.         . promethazine (PHENERGAN) 25 MG tablet   Oral   Take 1 tablet (25 mg total) by mouth every 6 (six) hours as needed for nausea.   12 tablet   0    BP 128/65  Pulse 60  Temp(Src) 97.7 F (36.5 C) (Oral)  Resp 16  SpO2 100% Physical Exam  Nursing note and vitals reviewed. Constitutional: She is oriented to person, place, and time. She appears well-developed and well-nourished.  HENT:  Head: Normocephalic and atraumatic.  Eyes: Conjunctivae and EOM are normal. Pupils are equal, round, and reactive to light.  Neck: Normal range of motion. Neck supple.  Cardiovascular: Normal rate, regular rhythm, normal heart sounds and intact distal pulses.   No murmur heard. Pulmonary/Chest: Effort normal. No respiratory distress. She has no wheezes.  Abdominal: Soft. Bowel sounds are normal. She exhibits no distension. There is tenderness. There is no rebound and no guarding.  RLQ tenderness  Genitourinary: Vagina normal and  uterus normal.  External exam - normal, no lesions Speculum exam: Pt has some white discharge, no blood Bimanual exam: Patient has no CMT, no adnexal tenderness or fullness and cervical os is closed  Neurological: She is alert and oriented to person, place, and time.  Skin: Skin is warm and dry.    ED Course  Procedures (including critical care time) Labs Review Labs Reviewed  BASIC METABOLIC PANEL - Abnormal; Notable for the following:    Potassium 3.0 (*)    Glucose, Bld 158 (*)    All other components within normal limits  URINALYSIS, ROUTINE W REFLEX MICROSCOPIC - Abnormal; Notable for the  following:    APPearance CLOUDY (*)    Protein, ur 30 (*)    All other components within normal limits  URINE MICROSCOPIC-ADD ON - Abnormal; Notable for the following:    Squamous Epithelial / LPF MANY (*)    Bacteria, UA FEW (*)    All other components within normal limits  PREGNANCY, URINE - Abnormal; Notable for the following:    Preg Test, Ur POSITIVE (*)    All other components within normal limits  HCG, QUANTITATIVE, PREGNANCY - Abnormal; Notable for the following:    hCG, Beta Chain, Quant, S 531 (*)    All other components within normal limits  CBC WITH DIFFERENTIAL   Imaging Review US Ob Comp Less 14 Wks  07/17/2013   CLINICAL DATA:  Abdominal pain, right-sided pain, vomiting, history Crohn's disease, positive pregnancy test; no quantitative beta HCG for correlation  EXAM: OBSTETRIC <14 WK [place] AND TRANSVAGINAL OB US  TECHNIQUE: Both transabdominal and transvaginal ultrasound examinations were performed for complete evaluation of the gestation as well as the maternal uterus, adnexal regions, and pelvic cul-de-sac. Transvaginal technique was performed to assess early pregnancy.  COMPARISON:  None  FINDINGS: Intrauterine gestational sac: Absent  Yolk sac:  N/A  Embryo:  N/A  Cardiac Activity: N/A  Heart Rate:  N/A bpm  Maternal uterus/adnexae:  Uterus appears normal morphology with a thickened endometrial complex question trophoblastic reaction.  No intrauterine gestational identified.  Left ovary normal size and morphology, 2.3 x 2.7 x 1.2 cm.  Right ovary is enlarged by a isoechoic nodule measuring 2.1 by 2.4 x 1.7 cm, containing peripheral hypervascularity on color Doppler imaging. Small surrounding follicles are noted. This may represent a hemorrhagic corpus luteum though it is difficult to entirely exclude a rare ovarian ectopic pregnancy. Moderate amount of complex pelvic fluid which could represent blood. Scattered bowel loops are seen within the fluid in the pelvis. An additional  tubular structure is seen in the fluid, demonstrating no peristalsis, question abnormal bowel loop a patient with Crohn's disease, intrapelvic clot, less likely a dilated fallopian tube filled with blood or debris.  Patient demonstrated no significant tenderness in the right pelvis with transducer pressure.  No additional pelvic masses identified.  IMPRESSION: No intrauterine gestational identified; pregnancy of uncertain position.  In the absence of a confirmed intrauterine gestational, differential diagnosis includes early intrauterine gestation too early to visualize, spontaneous abortion, and ectopic pregnancy.  Right ovarian nodule 2.1 x 2.4 x 1.7 cm, potentially representing a hemorrhagic corpus luteum though a rare intra-ovarian ectopic pregnancy is not entirely excluded in this setting though considered much less likely.  Moderate complex free pelvic fluid question blood.  Complex tubular structure within fluid in the pelvis, uncertain etiology, question abnormal thickened bowel loop in patient with Crohn's disease, clot, or less likely potentially of fallopian tube origin such  as hematosalpinx or pyosalpinx.  Correlation with quantitative beta HCG as well as followup ultrasound and/or serial quantitative beta HCG recommended to differentiate between above etiologies.   Electronically Signed   By: Lavonia Dana M.D.   On: 07/17/2013 17:24   US Ob Transvaginal  07/17/2013   CLINICAL DATA:  Abdominal pain, right-sided pain, vomiting, history Crohn's disease, positive pregnancy test; no quantitative beta HCG for correlation  EXAM: OBSTETRIC <14 WK [place] AND TRANSVAGINAL OB US  TECHNIQUE: Both transabdominal and transvaginal ultrasound examinations were performed for complete evaluation of the gestation as well as the maternal uterus, adnexal regions, and pelvic cul-de-sac. Transvaginal technique was performed to assess early pregnancy.  COMPARISON:  None  FINDINGS: Intrauterine gestational sac: Absent  Yolk sac:   N/A  Embryo:  N/A  Cardiac Activity: N/A  Heart Rate:  N/A bpm  Maternal uterus/adnexae:  Uterus appears normal morphology with a thickened endometrial complex question trophoblastic reaction.  No intrauterine gestational identified.  Left ovary normal size and morphology, 2.3 x 2.7 x 1.2 cm.  Right ovary is enlarged by a isoechoic nodule measuring 2.1 by 2.4 x 1.7 cm, containing peripheral hypervascularity on color Doppler imaging. Small surrounding follicles are noted. This may represent a hemorrhagic corpus luteum though it is difficult to entirely exclude a rare ovarian ectopic pregnancy. Moderate amount of complex pelvic fluid which could represent blood. Scattered bowel loops are seen within the fluid in the pelvis. An additional tubular structure is seen in the fluid, demonstrating no peristalsis, question abnormal bowel loop a patient with Crohn's disease, intrapelvic clot, less likely a dilated fallopian tube filled with blood or debris.  Patient demonstrated no significant tenderness in the right pelvis with transducer pressure.  No additional pelvic masses identified.  IMPRESSION: No intrauterine gestational identified; pregnancy of uncertain position.  In the absence of a confirmed intrauterine gestational, differential diagnosis includes early intrauterine gestation too early to visualize, spontaneous abortion, and ectopic pregnancy.  Right ovarian nodule 2.1 x 2.4 x 1.7 cm, potentially representing a hemorrhagic corpus luteum though a rare intra-ovarian ectopic pregnancy is not entirely excluded in this setting though considered much less likely.  Moderate complex free pelvic fluid question blood.  Complex tubular structure within fluid in the pelvis, uncertain etiology, question abnormal thickened bowel loop in patient with Crohn's disease, clot, or less likely potentially of fallopian tube origin such as hematosalpinx or pyosalpinx.  Correlation with quantitative beta HCG as well as followup  ultrasound and/or serial quantitative beta HCG recommended to differentiate between above etiologies.   Electronically Signed   By: Lavonia Dana M.D.   On: 07/17/2013 17:24    EKG Interpretation   None       MDM  No diagnosis found.  Pt comes in with cc of abd pain, right sided. She is found to be pregnant, and has crohns dz.  CT was cancelled.  Serial Abd exam x 2 - unchanged, no leukocytosis. Doubt appendicitis. Return precautions for it has been discussed.  Pt's pelvic exam was benign. US shows no IUP - so possible ectopic vs. Early pregnancy.  Pt signed out to Dr. Benjie Karvonen - who will f/u on the bhcg and the wet mount.  Pt advised [facility], and immediately if there is vaginal bleeding, worsening of abd pain.  Varney Biles, MD 07/19/13 1825

## 2013-07-17 NOTE — ED Notes (Signed)
Per EMS pt c/o abdominal pain with vomiting from Crohn's dx x2hrs.

## 2013-07-17 NOTE — Discharge Instructions (Signed)
Abdominal Pain Abdominal pain can be caused by many things. Your caregiver decides the seriousness of your pain by an examination and possibly blood tests and X-rays. Many cases can be observed and treated at home. Most abdominal pain is not caused by a disease and will probably improve without treatment. However, in many cases, more time must pass before a clear cause of the pain can be found. Before that point, it may not be known if you need more testing, or if hospitalization or surgery is needed. HOME CARE INSTRUCTIONS   Do not take laxatives unless directed by your caregiver.  Take pain medicine only as directed by your caregiver.  Only take over-the-counter or prescription medicines for pain, discomfort, or fever as directed by your caregiver.  Try a clear liquid diet (broth, tea, or water) for as long as directed by your caregiver. Slowly move to a bland diet as tolerated. SEEK IMMEDIATE MEDICAL CARE IF:   The pain does not go away.  You have a fever.  You keep throwing up (vomiting).  The pain is felt only in portions of the abdomen. Pain in the right side could possibly be appendicitis. In an adult, pain in the left lower portion of the abdomen could be colitis or diverticulitis.  You pass bloody or black tarry stools. MAKE SURE YOU:   Understand these instructions.  Will watch your condition.  Will get help right away if you are not doing well or get worse. Document Released: 04/07/2005 Document Revised: 09/20/2011 Document Reviewed: 02/14/2008 [facility] Patient Information 2014 [employer].  Pregnancy - First Trimester During sexual intercourse, millions of sperm go into the vagina. Only 1 sperm will penetrate and fertilize the female egg while it is in the Fallopian tube. One week later, the fertilized egg implants into the wall of the uterus. An embryo begins to develop into a baby. At 6 to 8 weeks, the eyes and face are formed and the heartbeat can be seen on  ultrasound. At the end of 12 weeks (first trimester), all the baby's organs are formed. Now that you are pregnant, you will want to do everything you can to have a healthy baby. Two of the most important things are to get good prenatal care and follow your caregiver's instructions. Prenatal care is all the medical care you receive before the baby's birth. It is given to prevent, find, and treat problems during the pregnancy and childbirth. PRENATAL EXAMS  During prenatal visits, your weight, blood pressure, and urine are checked. This is done to make sure you are healthy and progressing normally during the pregnancy.  A pregnant woman should gain 25 to 35 pounds during the pregnancy. However, if you are overweight or underweight, your caregiver will advise you regarding your weight.  Your caregiver will ask and answer questions for you.  Blood work, cervical cultures, other necessary tests, and a Pap test are done during your prenatal exams. These tests are done to check on your health and the probable health of your baby. Tests are strongly recommended and done for HIV with your permission. This is the virus that causes AIDS. These tests are done because medicines can be given to help prevent your baby from being born with this infection should you have been infected without knowing it. Blood work is also used to find out your blood type, previous infections, and follow your blood levels (hemoglobin).  Low hemoglobin (anemia) is common during pregnancy. Iron and vitamins are given to help prevent this.  Later in the pregnancy, blood tests for diabetes will be done along with any other tests if any problems develop.  You may need other tests to make sure you and the baby are doing well. CHANGES DURING THE FIRST TRIMESTER  Your body goes through many changes during pregnancy. They vary from person to person. Talk to your caregiver about changes you notice and are concerned about. Changes can  include:  Your menstrual period stops.  The egg and sperm carry the genes that determine what you look like. Genes from you and your partner are forming a baby. The female genes determine whether the baby is a boy or a girl.  Your body increases in girth and you may feel bloated.  Feeling sick to your stomach (nauseous) and throwing up (vomiting). If the vomiting is uncontrollable, call your caregiver.  Your breasts will begin to enlarge and become tender.  Your nipples may stick out more and become darker.  The need to urinate more. Painful urination may mean you have a bladder infection.  Tiring easily.  Loss of appetite.  Cravings for certain kinds of food.  At first, you may gain or lose a couple of pounds.  You may have changes in your emotions from day to day (excited to be pregnant or concerned something may go wrong with the pregnancy and baby).  You may have more vivid and strange dreams. HOME CARE INSTRUCTIONS   It is very important to avoid all smoking, alcohol and non-prescribed drugs during your pregnancy. These affect the formation and growth of the baby. Avoid chemicals while pregnant to ensure the delivery of a healthy infant.  Start your prenatal visits by the 12th week of pregnancy. They are usually scheduled monthly at first, then more often in the last 2 months before delivery. Keep your caregiver's appointments. Follow your caregiver's instructions regarding medicine use, blood and lab tests, exercise, and diet.  During pregnancy, you are providing food for you and your baby. Eat regular, well-balanced meals. Choose foods such as meat, fish, milk and other low fat dairy products, vegetables, fruits, and whole-grain breads and cereals. Your caregiver will tell you of the ideal weight gain.  You can help morning sickness by keeping soda crackers at the bedside. Eat a couple before arising in the morning. You may want to use the crackers without salt on  them.  Eating 4 to 5 small meals rather than 3 large meals a day also may help the nausea and vomiting.  Drinking liquids between meals instead of during meals also seems to help nausea and vomiting.  A physical sexual relationship may be continued throughout pregnancy if there are no other problems. Problems may be early (premature) leaking of amniotic fluid from the membranes, vaginal bleeding, or belly (abdominal) pain.  Exercise regularly if there are no restrictions. Check with your caregiver or physical therapist if you are unsure of the safety of some of your exercises. Greater weight gain will occur in the last 2 trimesters of pregnancy. Exercising will help:  Control your weight.  Keep you in shape.  Prepare you for labor and delivery.  Help you lose your pregnancy weight after you deliver your baby.  Wear a good support or jogging bra for breast tenderness during pregnancy. This may help if worn during sleep too.  Ask when prenatal classes are available. Begin classes when they are offered.  Do not use hot tubs, steam rooms, or saunas.  Wear your seat belt when driving. This  protects you and your baby if you are in an accident.  Avoid raw meat, uncooked cheese, cat litter boxes, and soil used by cats throughout the pregnancy. These carry germs that can cause birth defects in the baby.  The first trimester is a good time to visit your dentist for your dental health. Getting your teeth cleaned is okay. Use a softer toothbrush and brush gently during pregnancy.  Ask for help if you have financial, counseling, or nutritional needs during pregnancy. Your caregiver will be able to offer counseling for these needs as well as refer you for other special needs.  Do not take any medicines or herbs unless told by your caregiver.  Inform your caregiver if there is any mental or physical domestic violence.  Make a list of emergency phone numbers of family, friends, hospital, and  police and fire departments.  Write down your questions. Take them to your prenatal visit.  Do not douche.  Do not cross your legs.  If you have to stand for long periods of time, rotate you feet or take small steps in a circle.  You may have more vaginal secretions that may require a sanitary pad. Do not use tampons or scented sanitary pads. MEDICINES AND DRUG USE IN PREGNANCY  Take prenatal vitamins as directed. The vitamin should contain 1 milligram of folic acid. Keep all vitamins out of reach of children. Only a couple vitamins or tablets containing iron may be fatal to a baby or young child when ingested.  Avoid use of all medicines, including herbs, over-the-counter medicines, not prescribed or suggested by your caregiver. Only take over-the-counter or prescription medicines for pain, discomfort, or fever as directed by your caregiver. Do not use aspirin, ibuprofen, or naproxen unless directed by your caregiver.  Let your caregiver also know about herbs you may be using.  Alcohol is related to a number of birth defects. This includes fetal alcohol syndrome. All alcohol, in any form, should be avoided completely. Smoking will cause low birth rate and premature babies.  Street or illegal drugs are very harmful to the baby. They are absolutely forbidden. A baby born to an addicted mother will be addicted at birth. The baby will go through the same withdrawal an adult does.  Let your caregiver know about any medicines that you have to take and for what reason you take them. SEEK MEDICAL CARE IF:  You have any concerns or worries during your pregnancy. It is better to call with your questions if you feel they cannot wait, rather than worry about them. SEEK IMMEDIATE MEDICAL CARE IF:   An unexplained oral temperature above 102 F (38.9 C) develops, or as your caregiver suggests.  You have leaking of fluid from the vagina (birth canal). If leaking membranes are suspected, take your  temperature and inform your caregiver of this when you call.  There is vaginal spotting or bleeding. Notify your caregiver of the amount and how many pads are used.  You develop a bad smelling vaginal discharge with a change in the color.  You continue to feel sick to your stomach (nauseated) and have no relief from remedies suggested. You vomit blood or coffee ground-like materials.  You lose more than 2 pounds of weight in 1 week.  You gain more than 2 pounds of weight in 1 week and you notice swelling of your face, hands, feet, or legs.  You gain 5 pounds or more in 1 week (even if you do not have swelling  of your hands, face, legs, or feet).  You get exposed to [place] measles and have never had them.  You are exposed to fifth disease or chickenpox.  You develop belly (abdominal) pain. Round ligament discomfort is a common non-cancerous (benign) cause of abdominal pain in pregnancy. Your caregiver still must evaluate this.  You develop headache, fever, diarrhea, pain with urination, or shortness of breath.  You fall or are in a car accident or have any kind of trauma.  There is mental or physical violence in your home. Document Released: 06/22/2001 Document Revised: 03/22/2012 Document Reviewed: 12/24/2008 [facility] Patient Information 2014 [place].

## 2013-07-19 ENCOUNTER — Inpatient Hospital Stay (HOSPITAL_COMMUNITY)
Admission: AD | Admit: 2013-07-19 | Discharge: 2013-07-19 | Disposition: A | Discharge status: Home / Self Care | Payer: Medicaid Other | Source: Ambulatory Visit | Attending: Obstetrics & Gynecology | Admitting: Obstetrics & Gynecology

## 2013-07-19 ENCOUNTER — Inpatient Hospital Stay (HOSPITAL_COMMUNITY): Payer: Medicaid Other

## 2013-07-19 DIAGNOSIS — O99330 Smoking (tobacco) complicating pregnancy, unspecified trimester: Secondary | ICD-10-CM | POA: Insufficient documentation

## 2013-07-19 DIAGNOSIS — O99891 Other specified diseases and conditions complicating pregnancy: Secondary | ICD-10-CM | POA: Insufficient documentation

## 2013-07-19 DIAGNOSIS — O26899 Other specified pregnancy related conditions, unspecified trimester: Secondary | ICD-10-CM

## 2013-07-19 DIAGNOSIS — R109 Unspecified abdominal pain: Secondary | ICD-10-CM | POA: Insufficient documentation

## 2013-07-19 DIAGNOSIS — O9989 Other specified diseases and conditions complicating pregnancy, childbirth and the puerperium: Principal | ICD-10-CM

## 2013-07-19 DIAGNOSIS — K5090 Crohn's disease, unspecified, without complications: Secondary | ICD-10-CM | POA: Insufficient documentation

## 2013-07-19 LAB — HCG, QUANTITATIVE, PREGNANCY: HCG, BETA CHAIN, QUANT, S: 1307 m[IU]/mL — AB (ref <5)

## 2013-07-19 NOTE — MAU Note (Signed)
Pt here for f/u blood work and u/s. Denies pain or bleeding.

## 2013-07-19 NOTE — Discharge Instructions (Signed)
Pregnancy - First Trimester  During sexual intercourse, millions of sperm go into the vagina. Only 1 sperm will penetrate and fertilize the female egg while it is in the Fallopian tube. One week later, the fertilized egg implants into the wall of the uterus. An embryo begins to develop into a baby. At 6 to 8 weeks, the eyes and face are formed and the heartbeat can be seen on ultrasound. At the end of 12 weeks (first trimester), all the baby's organs are formed. Now that you are pregnant, you will want to do everything you can to have a healthy baby. Two of the most important things are to get good prenatal care and follow your caregiver's instructions. Prenatal care is all the medical care you receive before the baby's birth. It is given to prevent, find, and treat problems during the pregnancy and childbirth.  PRENATAL EXAMS  · During prenatal visits, your weight, blood pressure, and urine are checked. This is done to make sure you are healthy and progressing normally during the pregnancy.  · A pregnant woman should gain 25 to 35 pounds during the pregnancy. However, if you are overweight or underweight, your caregiver will advise you regarding your weight.  · Your caregiver will ask and answer questions for you.  · Blood work, cervical cultures, other necessary tests, and a Pap test are done during your prenatal exams. These tests are done to check on your health and the probable health of your baby. Tests are strongly recommended and done for HIV with your permission. This is the virus that causes AIDS. These tests are done because medicines can be given to help prevent your baby from being born with this infection should you have been infected without knowing it. Blood work is also used to find out your blood type, previous infections, and follow your blood levels (hemoglobin).  · Low hemoglobin (anemia) is common during pregnancy. Iron and vitamins are given to help prevent this. Later in the pregnancy, blood  tests for diabetes will be done along with any other tests if any problems develop.  · You may need other tests to make sure you and the baby are doing well.  CHANGES DURING THE FIRST TRIMESTER   Your body goes through many changes during pregnancy. They vary from person to person. Talk to your caregiver about changes you notice and are concerned about. Changes can include:  · Your menstrual period stops.  · The egg and sperm carry the genes that determine what you look like. Genes from you and your partner are forming a baby. The female genes determine whether the baby is a boy or a girl.  · Your body increases in girth and you may feel bloated.  · Feeling sick to your stomach (nauseous) and throwing up (vomiting). If the vomiting is uncontrollable, call your caregiver.  · Your breasts will begin to enlarge and become tender.  · Your nipples may stick out more and become darker.  · The need to urinate more. Painful urination may mean you have a bladder infection.  · Tiring easily.  · Loss of appetite.  · Cravings for certain kinds of food.  · At first, you may gain or lose a couple of pounds.  · You may have changes in your emotions from day to day (excited to be pregnant or concerned something may go wrong with the pregnancy and baby).  · You may have more vivid and strange dreams.  HOME CARE INSTRUCTIONS   ·   It is very important to avoid all smoking, alcohol and non-prescribed drugs during your pregnancy. These affect the formation and growth of the baby. Avoid chemicals while pregnant to ensure the delivery of a healthy infant.  · Start your prenatal visits by the 12th week of pregnancy. They are usually scheduled monthly at first, then more often in the last 2 months before delivery. Keep your caregiver's appointments. Follow your caregiver's instructions regarding medicine use, blood and lab tests, exercise, and diet.  · During pregnancy, you are providing food for you and your baby. Eat regular, well-balanced  meals. Choose foods such as meat, fish, milk and other low fat dairy products, vegetables, fruits, and whole-grain breads and cereals. Your caregiver will tell you of the ideal weight gain.  · You can help morning sickness by keeping soda crackers at the bedside. Eat a couple before arising in the morning. You may want to use the crackers without salt on them.  · Eating 4 to 5 small meals rather than 3 large meals a day also may help the nausea and vomiting.  · Drinking liquids between meals instead of during meals also seems to help nausea and vomiting.  · A physical sexual relationship may be continued throughout pregnancy if there are no other problems. Problems may be early (premature) leaking of amniotic fluid from the membranes, vaginal bleeding, or belly (abdominal) pain.  · Exercise regularly if there are no restrictions. Check with your caregiver or physical therapist if you are unsure of the safety of some of your exercises. Greater weight gain will occur in the last 2 trimesters of pregnancy. Exercising will help:  · Control your weight.  · Keep you in shape.  · Prepare you for labor and delivery.  · Help you lose your pregnancy weight after you deliver your baby.  · Wear a good support or jogging bra for breast tenderness during pregnancy. This may help if worn during sleep too.  · Ask when prenatal classes are available. Begin classes when they are offered.  · Do not use hot tubs, steam rooms, or saunas.  · Wear your seat belt when driving. This protects you and your baby if you are in an accident.  · Avoid raw meat, uncooked cheese, cat litter boxes, and soil used by cats throughout the pregnancy. These carry germs that can cause birth defects in the baby.  · The first trimester is a good time to visit your dentist for your dental health. Getting your teeth cleaned is okay. Use a softer toothbrush and brush gently during pregnancy.  · Ask for help if you have financial, counseling, or nutritional needs  during pregnancy. Your caregiver will be able to offer counseling for these needs as well as refer you for other special needs.  · Do not take any medicines or herbs unless told by your caregiver.  · Inform your caregiver if there is any mental or physical domestic violence.  · Make a list of emergency phone numbers of family, friends, hospital, and police and fire departments.  · Write down your questions. Take them to your prenatal visit.  · Do not douche.  · Do not cross your legs.  · If you have to stand for long periods of time, rotate you feet or take small steps in a circle.  · You may have more vaginal secretions that may require a sanitary pad. Do not use tampons or scented sanitary pads.  MEDICINES AND DRUG USE IN PREGNANCY  ·   Take prenatal vitamins as directed. The vitamin should contain 1 milligram of folic acid. Keep all vitamins out of reach of children. Only a couple vitamins or tablets containing iron may be fatal to a baby or young child when ingested.  · Avoid use of all medicines, including herbs, over-the-counter medicines, not prescribed or suggested by your caregiver. Only take over-the-counter or prescription medicines for pain, discomfort, or fever as directed by your caregiver. Do not use aspirin, ibuprofen, or naproxen unless directed by your caregiver.  · Let your caregiver also know about herbs you may be using.  · Alcohol is related to a number of birth defects. This includes fetal alcohol syndrome. All alcohol, in any form, should be avoided completely. Smoking will cause low birth rate and premature babies.  · Street or illegal drugs are very harmful to the baby. They are absolutely forbidden. A baby born to an addicted mother will be addicted at birth. The baby will go through the same withdrawal an adult does.  · Let your caregiver know about any medicines that you have to take and for what reason you take them.  SEEK MEDICAL CARE IF:   You have any concerns or worries during your  pregnancy. It is better to call with your questions if you feel they cannot wait, rather than worry about them.  SEEK IMMEDIATE MEDICAL CARE IF:   · An unexplained oral temperature above 102° F (38.9° C) develops, or as your caregiver suggests.  · You have leaking of fluid from the vagina (birth canal). If leaking membranes are suspected, take your temperature and inform your caregiver of this when you call.  · There is vaginal spotting or bleeding. Notify your caregiver of the amount and how many pads are used.  · You develop a bad smelling vaginal discharge with a change in the color.  · You continue to feel sick to your stomach (nauseated) and have no relief from remedies suggested. You vomit blood or coffee ground-like materials.  · You lose more than 2 pounds of weight in 1 week.  · You gain more than 2 pounds of weight in 1 week and you notice swelling of your face, hands, feet, or legs.  · You gain 5 pounds or more in 1 week (even if you do not have swelling of your hands, face, legs, or feet).  · You get exposed to German measles and have never had them.  · You are exposed to fifth disease or chickenpox.  · You develop belly (abdominal) pain. Round ligament discomfort is a common non-cancerous (benign) cause of abdominal pain in pregnancy. Your caregiver still must evaluate this.  · You develop headache, fever, diarrhea, pain with urination, or shortness of breath.  · You fall or are in a car accident or have any kind of trauma.  · There is mental or physical violence in your home.  Document Released: 06/22/2001 Document Revised: 03/22/2012 Document Reviewed: 12/24/2008  Exit[employer]® Patient Information ©2014 [employer].

## 2013-07-19 NOTE — MAU Provider Note (Signed)
History     CSN: [id number]  Arrival date and time: 07/19/13 1308   None     No chief complaint on file.  HPI  Melanie [name] is a 28 y.o. G3P2102 at [redacted] weeks gestation who presents today for FU HCG. She denies any bleeding and reports some mild cramps.   Past Medical History  Diagnosis Date  . Anemia   . Gestational diabetes mellitus in pregnancy   . Cardiac tumor   . Crohn's disease     Past Surgical History  Procedure Laterality Date  . Cardiac surgery      at 28 years old    Family History  Problem Relation Age of Onset  . Hypertension Mother     History  Substance Use Topics  . Smoking status: Current Some Day Smoker  . Smokeless tobacco: Never Used  . Alcohol Use: Yes     Comment: occasionally    Allergies: No Known Allergies  Prescriptions prior to admission  Medication Sig Dispense Refill  . acetaminophen (TYLENOL) 500 MG tablet Take 1,000 mg by mouth every 6 (six) hours as needed for moderate pain.      . promethazine (PHENERGAN) 25 MG tablet Take 1 tablet (25 mg total) by mouth every 6 (six) hours as needed for nausea.  12 tablet  0    ROS Physical Exam   There were no vitals taken for this visit.  Physical Exam  MAU Course  Procedures   Results for SUSIE, EHRESMAN (MRN [id number]) as of 07/19/2013 14:54  Ref. Range 07/17/2013 13:40 07/17/2013 16:58 07/17/2013 17:50 07/19/2013 13:12  hCG, Beta Chain, Quant, S Latest Range: <5 mIU/mL 531 (H)   1307 (H)   US Ob Comp Less 14 Wks  07/17/2013   CLINICAL DATA:  Abdominal pain, right-sided pain, vomiting, history Crohn's disease, positive pregnancy test; no quantitative beta HCG for correlation  EXAM: OBSTETRIC <14 WK [place] AND TRANSVAGINAL OB US  TECHNIQUE: Both transabdominal and transvaginal ultrasound examinations were performed for complete evaluation of the gestation as well as the maternal uterus, adnexal regions, and pelvic cul-de-sac. Transvaginal technique was performed to assess early pregnancy.   COMPARISON:  None  FINDINGS: Intrauterine gestational sac: Absent  Yolk sac:  N/A  Embryo:  N/A  Cardiac Activity: N/A  Heart Rate:  N/A bpm  Maternal uterus/adnexae:  Uterus appears normal morphology with a thickened endometrial complex question trophoblastic reaction.  No intrauterine gestational identified.  Left ovary normal size and morphology, 2.3 x 2.7 x 1.2 cm.  Right ovary is enlarged by a isoechoic nodule measuring 2.1 by 2.4 x 1.7 cm, containing peripheral hypervascularity on color Doppler imaging. Small surrounding follicles are noted. This may represent a hemorrhagic corpus luteum though it is difficult to entirely exclude a rare ovarian ectopic pregnancy. Moderate amount of complex pelvic fluid which could represent blood. Scattered bowel loops are seen within the fluid in the pelvis. An additional tubular structure is seen in the fluid, demonstrating no peristalsis, question abnormal bowel loop a patient with Crohn's disease, intrapelvic clot, less likely a dilated fallopian tube filled with blood or debris.  Patient demonstrated no significant tenderness in the right pelvis with transducer pressure.  No additional pelvic masses identified.  IMPRESSION: No intrauterine gestational identified; pregnancy of uncertain position.  In the absence of a confirmed intrauterine gestational, differential diagnosis includes early intrauterine gestation too early to visualize, spontaneous abortion, and ectopic pregnancy.  Right ovarian nodule 2.1 x 2.4 x 1.7 cm, potentially representing  a hemorrhagic corpus luteum though a rare intra-ovarian ectopic pregnancy is not entirely excluded in this setting though considered much less likely.  Moderate complex free pelvic fluid question blood.  Complex tubular structure within fluid in the pelvis, uncertain etiology, question abnormal thickened bowel loop in patient with Crohn's disease, clot, or less likely potentially of fallopian tube origin such as hematosalpinx or  pyosalpinx.  Correlation with quantitative beta HCG as well as followup ultrasound and/or serial quantitative beta HCG recommended to differentiate between above etiologies.   Electronically Signed   By: Lavonia Dana M.D.   On: 07/17/2013 17:24   US Ob Transvaginal  07/19/2013   CLINICAL DATA:  Pelvic pain.  4 week 5 day gestational age by LMP.  EXAM: TRANSVAGINAL OB ULTRASOUND  TECHNIQUE: Transvaginal ultrasound was performed for complete evaluation of the gestation as well as the maternal uterus, adnexal regions, and pelvic cul-de-sac.  COMPARISON:  07/17/2013  FINDINGS: Intrauterine gestational sac: Visualized/normal in shape.  Yolk sac:  Not visualized  Embryo:  Not visualized  MSD: 3  mm   4 w   6  d  Maternal uterus/adnexae: Small right ovarian corpus luteum noted. Mild right hydrosalpinx also noted. Normal appearance of left ovary. No adnexal mass or free fluid identified.  IMPRESSION: Single 5 week intrauterine gestational sac now visualized, which is concordant with LMP.  Mild right hydrosalpinx incidentally noted. No adnexal mass visualized.   Electronically Signed   By: Earle Gell M.D.   On: 07/19/2013 14:44   US Ob Transvaginal  07/17/2013   CLINICAL DATA:  Abdominal pain, right-sided pain, vomiting, history Crohn's disease, positive pregnancy test; no quantitative beta HCG for correlation  EXAM: OBSTETRIC <14 WK [place] AND TRANSVAGINAL OB US  TECHNIQUE: Both transabdominal and transvaginal ultrasound examinations were performed for complete evaluation of the gestation as well as the maternal uterus, adnexal regions, and pelvic cul-de-sac. Transvaginal technique was performed to assess early pregnancy.  COMPARISON:  None  FINDINGS: Intrauterine gestational sac: Absent  Yolk sac:  N/A  Embryo:  N/A  Cardiac Activity: N/A  Heart Rate:  N/A bpm  Maternal uterus/adnexae:  Uterus appears normal morphology with a thickened endometrial complex question trophoblastic reaction.  No intrauterine gestational  identified.  Left ovary normal size and morphology, 2.3 x 2.7 x 1.2 cm.  Right ovary is enlarged by a isoechoic nodule measuring 2.1 by 2.4 x 1.7 cm, containing peripheral hypervascularity on color Doppler imaging. Small surrounding follicles are noted. This may represent a hemorrhagic corpus luteum though it is difficult to entirely exclude a rare ovarian ectopic pregnancy. Moderate amount of complex pelvic fluid which could represent blood. Scattered bowel loops are seen within the fluid in the pelvis. An additional tubular structure is seen in the fluid, demonstrating no peristalsis, question abnormal bowel loop a patient with Crohn's disease, intrapelvic clot, less likely a dilated fallopian tube filled with blood or debris.  Patient demonstrated no significant tenderness in the right pelvis with transducer pressure.  No additional pelvic masses identified.  IMPRESSION: No intrauterine gestational identified; pregnancy of uncertain position.  In the absence of a confirmed intrauterine gestational, differential diagnosis includes early intrauterine gestation too early to visualize, spontaneous abortion, and ectopic pregnancy.  Right ovarian nodule 2.1 x 2.4 x 1.7 cm, potentially representing a hemorrhagic corpus luteum though a rare intra-ovarian ectopic pregnancy is not entirely excluded in this setting though considered much less likely.  Moderate complex free pelvic fluid question blood.  Complex tubular structure within fluid  in the pelvis, uncertain etiology, question abnormal thickened bowel loop in patient with Crohn's disease, clot, or less likely potentially of fallopian tube origin such as hematosalpinx or pyosalpinx.  Correlation with quantitative beta HCG as well as followup ultrasound and/or serial quantitative beta HCG recommended to differentiate between above etiologies.   Electronically Signed   By: Lavonia Dana M.D.   On: 07/17/2013 17:24      Assessment and Plan   1. Abdominal pain in  pregnancy    Appropriate rise in HCG Intrauterine gestational sac now seen Start [facility] First trimester precautions reviewed Return to MAU as needed    Medication List         acetaminophen 500 MG tablet  Commonly known as:  TYLENOL  Take 1,000 mg by mouth every 6 (six) hours as needed for moderate pain.     promethazine 25 MG tablet  Commonly known as:  PHENERGAN  Take 1 tablet (25 mg total) by mouth every 6 (six) hours as needed for nausea.       Follow-up Information   Schedule an appointment as soon as possible for a visit with [facility] [place].   Contact information:   [place] [address] [phone]       Mathis Bud 07/19/2013, 2:56 PM

## 2013-07-19 NOTE — MAU Provider Note (Signed)
Attestation of Attending Supervision of Advanced Practitioner (CNM/NP): Evaluation and management procedures were performed by the Advanced Practitioner under my supervision and collaboration.  I have reviewed the Advanced Practitioner's note and chart, and I agree with the management and plan.  HARRAWAY-SMITH, Onna Nodal 3:46 PM

## 2013-08-06 LAB — US OB TRANSVAGINAL

## 2013-08-06 LAB — OB RESULTS CONSOLE GC/CHLAMYDIA
CHLAMYDIA, DNA PROBE: NEGATIVE
Gonorrhea: NEGATIVE

## 2013-08-13 ENCOUNTER — Ambulatory Visit: Payer: Medicaid Other | Admitting: Cardiology

## 2013-08-15 ENCOUNTER — Emergency Department (HOSPITAL_COMMUNITY)
Admission: EM | Admit: 2013-08-15 | Discharge: 2013-08-15 | Disposition: A | Discharge status: Home / Self Care | Payer: Medicaid Other | Attending: Emergency Medicine | Admitting: Emergency Medicine

## 2013-08-15 ENCOUNTER — Encounter (HOSPITAL_COMMUNITY): Payer: Self-pay | Admitting: Emergency Medicine

## 2013-08-15 DIAGNOSIS — Z8719 Personal history of other diseases of the digestive system: Secondary | ICD-10-CM | POA: Insufficient documentation

## 2013-08-15 DIAGNOSIS — O99330 Smoking (tobacco) complicating pregnancy, unspecified trimester: Secondary | ICD-10-CM | POA: Insufficient documentation

## 2013-08-15 DIAGNOSIS — O209 Hemorrhage in early pregnancy, unspecified: Secondary | ICD-10-CM | POA: Insufficient documentation

## 2013-08-15 DIAGNOSIS — R109 Unspecified abdominal pain: Secondary | ICD-10-CM

## 2013-08-15 DIAGNOSIS — Z79899 Other long term (current) drug therapy: Secondary | ICD-10-CM | POA: Insufficient documentation

## 2013-08-15 DIAGNOSIS — Z862 Personal history of diseases of the blood and blood-forming organs and certain disorders involving the immune mechanism: Secondary | ICD-10-CM | POA: Insufficient documentation

## 2013-08-15 DIAGNOSIS — R197 Diarrhea, unspecified: Secondary | ICD-10-CM | POA: Insufficient documentation

## 2013-08-15 DIAGNOSIS — R112 Nausea with vomiting, unspecified: Secondary | ICD-10-CM

## 2013-08-15 DIAGNOSIS — R1013 Epigastric pain: Secondary | ICD-10-CM | POA: Insufficient documentation

## 2013-08-15 DIAGNOSIS — Z8632 Personal history of gestational diabetes: Secondary | ICD-10-CM | POA: Insufficient documentation

## 2013-08-15 DIAGNOSIS — O210 Mild hyperemesis gravidarum: Secondary | ICD-10-CM | POA: Insufficient documentation

## 2013-08-15 DIAGNOSIS — Z8679 Personal history of other diseases of the circulatory system: Secondary | ICD-10-CM | POA: Insufficient documentation

## 2013-08-15 LAB — WET PREP, GENITAL
Clue Cells Wet Prep HPF POC: NONE SEEN
TRICH WET PREP: NONE SEEN
YEAST WET PREP: NONE SEEN

## 2013-08-15 LAB — CBC WITH DIFFERENTIAL/PLATELET
BASOS ABS: 0 10*3/uL (ref 0.0–0.1)
BASOS PCT: 0 % (ref 0–1)
Eosinophils Absolute: 0.2 10*3/uL (ref 0.0–0.7)
Eosinophils Relative: 2 % (ref 0–5)
HCT: 41 % (ref 36.0–46.0)
Hemoglobin: 13.5 g/dL (ref 12.0–15.0)
Lymphocytes Relative: 17 % (ref 12–46)
Lymphs Abs: 2.2 10*3/uL (ref 0.7–4.0)
MCH: 31.2 pg (ref 26.0–34.0)
MCHC: 32.9 g/dL (ref 30.0–36.0)
MCV: 94.7 fL (ref 78.0–100.0)
Monocytes Absolute: 1 10*3/uL (ref 0.1–1.0)
Monocytes Relative: 8 % (ref 3–12)
NEUTROS PCT: 73 % (ref 43–77)
Neutro Abs: 9.5 10*3/uL — ABNORMAL HIGH (ref 1.7–7.7)
Platelets: 251 10*3/uL (ref 150–400)
RBC: 4.33 MIL/uL (ref 3.87–5.11)
RDW: 14.4 % (ref 11.5–15.5)
WBC: 13 10*3/uL — ABNORMAL HIGH (ref 4.0–10.5)

## 2013-08-15 LAB — COMPREHENSIVE METABOLIC PANEL
ALBUMIN: 3.6 g/dL (ref 3.5–5.2)
ALK PHOS: 95 U/L (ref 39–117)
ALT: 16 U/L (ref 0–35)
AST: 15 U/L (ref 0–37)
BILIRUBIN TOTAL: 0.3 mg/dL (ref 0.3–1.2)
BUN: 9 mg/dL (ref 6–23)
CHLORIDE: 99 meq/L (ref 96–112)
CO2: 22 mEq/L (ref 19–32)
Calcium: 9 mg/dL (ref 8.4–10.5)
Creatinine, Ser: 0.64 mg/dL (ref 0.50–1.10)
GFR calc Af Amer: >90 mL/min (ref >90)
GFR calc non Af Amer: >90 mL/min (ref >90)
Glucose, Bld: 210 mg/dL — ABNORMAL HIGH (ref 70–99)
POTASSIUM: 3.5 meq/L — AB (ref 3.7–5.3)
Sodium: 136 mEq/L — ABNORMAL LOW (ref 137–147)
TOTAL PROTEIN: 7.2 g/dL (ref 6.0–8.3)

## 2013-08-15 LAB — URINALYSIS, ROUTINE W REFLEX MICROSCOPIC
Bilirubin Urine: NEGATIVE
Glucose, UA: NEGATIVE mg/dL
KETONES UR: NEGATIVE mg/dL
Leukocytes, UA: NEGATIVE
Nitrite: NEGATIVE
PH: 5.5 (ref 5.0–8.0)
PROTEIN: NEGATIVE mg/dL
Specific Gravity, Urine: 1.022 (ref 1.005–1.030)
Urobilinogen, UA: 0.2 mg/dL (ref 0.0–1.0)

## 2013-08-15 LAB — POCT PREGNANCY, URINE: Preg Test, Ur: POSITIVE — AB

## 2013-08-15 LAB — URINE MICROSCOPIC-ADD ON

## 2013-08-15 LAB — LIPASE, BLOOD: LIPASE: 28 U/L (ref 11–59)

## 2013-08-15 MED ORDER — MORPHINE SULFATE 4 MG/ML IJ SOLN
4.0000 mg | Freq: Once | INTRAMUSCULAR | Status: AC
  Administered 2013-08-15: 4 mg via INTRAVENOUS
  Filled 2013-08-15: qty 1

## 2013-08-15 MED ORDER — SODIUM CHLORIDE 0.9 % IV BOLUS (SEPSIS)
1000.0000 mL | Freq: Once | INTRAVENOUS | Status: AC
  Administered 2013-08-15: 1000 mL via INTRAVENOUS

## 2013-08-15 MED ORDER — ONDANSETRON HCL 4 MG PO TABS: 4.0000 mg | ORAL_TABLET | Freq: Four times a day (QID) | ORAL | Status: DC

## 2013-08-15 MED ORDER — ONDANSETRON HCL 4 MG/2ML IJ SOLN
4.0000 mg | Freq: Once | INTRAMUSCULAR | Status: AC
  Administered 2013-08-15: 4 mg via INTRAVENOUS
  Filled 2013-08-15: qty 2

## 2013-08-15 NOTE — ED Notes (Signed)
Bed: WA17 Expected date:  Expected time:  Means of arrival:  Comments: EMS/Crohn's/1st trimester pregnancy/abdominal pain

## 2013-08-15 NOTE — ED Notes (Signed)
Pt has a tattoo on both arms that is a week old.  Tattoo looks well with no obvious infection or redness to site.

## 2013-08-15 NOTE — Discharge Instructions (Signed)
Abdominal Pain During Pregnancy °Abdominal pain is common in pregnancy. Most of the time, it does not cause harm. There are many causes of abdominal pain. Some causes are more serious than others. Some of the causes of abdominal pain in pregnancy are easily diagnosed. Occasionally, the diagnosis takes time to understand. Other times, the cause is not determined. Abdominal pain can be a sign that something is very wrong with the pregnancy, or the pain may have nothing to do with the pregnancy at all. For this reason, always tell your health care provider if you have any abdominal discomfort. °HOME CARE INSTRUCTIONS  °Monitor your abdominal pain for any changes. The following actions may help to alleviate any discomfort you are experiencing: °· Do not have sexual intercourse or put anything in your vagina until your symptoms go away completely. °· Get plenty of rest until your pain improves. °· Drink clear fluids if you feel nauseous. Avoid solid food as long as you are uncomfortable or nauseous. °· Only take over-the-counter or prescription medicine as directed by your health care provider. °· Keep all follow-up appointments with your health care provider. °SEEK IMMEDIATE MEDICAL CARE IF: °· You are bleeding, leaking fluid, or passing tissue from the vagina. °· You have increasing pain or cramping. °· You have persistent vomiting. °· You have painful or bloody urination. °· You have a fever. °· You notice a decrease in your baby's movements. °· You have extreme weakness or feel faint. °· You have shortness of breath, with or without abdominal pain. °· You develop a severe headache with abdominal pain. °· You have abnormal vaginal discharge with abdominal pain. °· You have persistent diarrhea. °· You have abdominal pain that continues even after rest, or gets worse. °MAKE SURE YOU:  °· Understand these instructions. °· Will watch your condition. °· Will get help right away if you are not doing well or get  worse. °Document Released: 06/28/2005 Document Revised: 04/18/2013 Document Reviewed: 01/25/2013 °[employer]® Patient Information ©2014 [employer]. ° °

## 2013-08-15 NOTE — ED Notes (Signed)
EMS called to home.  Found patient in bathroom after vomiting.  Patient is ambulatory. Patient is alert and oriented x3.  She is complaining of generalized abdominal pain that started at 5am. Currently her pain is 10 is 10 with nausea

## 2013-08-15 NOTE — ED Provider Notes (Signed)
CSN: [id number]     Arrival date & time 08/15/13  5462 History   First MD Initiated Contact with Patient 08/15/13 [phone]     Chief Complaint  Patient presents with  . Abdominal Pain   (Consider location/radiation/quality/duration/timing/severity/associated sxs/prior Treatment) Patient is a 28 y.o. female presenting with abdominal pain.  Abdominal Pain Pain location:  Epigastric Pain quality: aching   Pain radiates to:  Does not radiate Pain severity:  Severe Onset quality:  Gradual Duration:  3 hours Timing:  Constant Progression:  Worsening Chronicity:  Recurrent Context: suspicious food intake ("I drank some milk yesterday, I'm not supposed to have dairy")   Context comment:  Hx of Crohn's Disease.  Currently first trimester pregnancy. Relieved by:  Nothing Ineffective treatments: GasX, Emetrol.   Associated symptoms: diarrhea, nausea, vaginal bleeding (some spotting two weeks ago, getting better.) and vomiting   Associated symptoms: no chest pain, no cough, no fever and no shortness of breath     Past Medical History  Diagnosis Date  . Anemia   . Gestational diabetes mellitus in pregnancy   . Cardiac tumor   . Crohn's disease    Past Surgical History  Procedure Laterality Date  . Cardiac surgery      at 28 years old   Family History  Problem Relation Age of Onset  . Hypertension Mother    History  Substance Use Topics  . Smoking status: Current Some Day Smoker  . Smokeless tobacco: Never Used  . Alcohol Use: Yes     Comment: occasionally   OB History   Grav Para Term Preterm Abortions TAB SAB Ect Mult Living   4 3 2 1      2      Review of Systems  Constitutional: Negative for fever.  HENT: Negative for congestion.   Respiratory: Negative for cough and shortness of breath.   Cardiovascular: Negative for chest pain.  Gastrointestinal: Positive for nausea, vomiting, abdominal pain and diarrhea.  Genitourinary: Positive for vaginal bleeding (some spotting two  weeks ago, getting better.).  All other systems reviewed and are negative.    Allergies  Review of patient's allergies indicates no known allergies.  Home Medications   Current Outpatient Rx  Name  Route  Sig  Dispense  Refill  . anti-nausea (EMETROL) solution   Oral   Take 10 mLs by mouth every 15 (fifteen) minutes as needed for nausea or vomiting.         . Prenatal Vit-Fe Fumarate-FA (MULTIVITAMIN-PRENATAL) 27-0.8 MG TABS tablet   Oral   Take 1 tablet by mouth daily at 12 noon.         . simethicone (MYLICON) 009 MG chewable tablet   Oral   Chew 125 mg by mouth every 6 (six) hours as needed for flatulence.          BP 153/82  Pulse 56  Temp(Src) 98.3 F (36.8 C) (Oral)  Resp 18  SpO2 100%  LMP 09/29/2012 Physical Exam  Nursing note and vitals reviewed. Constitutional: She is oriented to person, place, and time. She appears well-developed and well-nourished. No distress.  Appears uncomfortable, nauseated   HENT:  Head: Normocephalic and atraumatic.  Mouth/Throat: Oropharynx is clear and moist.  Eyes: Conjunctivae are normal. Pupils are equal, round, and reactive to light. No scleral icterus.  Neck: Neck supple.  Cardiovascular: Normal rate, regular rhythm, normal heart sounds and intact distal pulses.   No murmur heard. Pulmonary/Chest: Effort normal and breath sounds normal. No stridor. No  respiratory distress. She has no rales.  Abdominal: Soft. Bowel sounds are normal. She exhibits no distension. There is tenderness in the epigastric area. There is no rigidity, no rebound and no guarding.  Genitourinary: There is no rash, tenderness or lesion on the right labia. There is no rash, tenderness or lesion on the left labia. Uterus is not tender. Cervix exhibits no motion tenderness, no discharge and no friability. Right adnexum displays no mass, no tenderness and no fullness. Left adnexum displays no mass, no tenderness and no fullness.  Musculoskeletal: Normal  range of motion.  Neurological: She is alert and oriented to person, place, and time.  Skin: Skin is warm and dry. No rash noted.  Psychiatric: She has a normal mood and affect. Her behavior is normal.    ED Course  Procedures (including critical care time) Labs Review Labs Reviewed  WET PREP, GENITAL - Abnormal; Notable for the following:    WBC, Wet Prep HPF POC MODERATE (*)    All other components within normal limits  CBC WITH DIFFERENTIAL - Abnormal; Notable for the following:    WBC 13.0 (*)    Neutro Abs 9.5 (*)    All other components within normal limits  COMPREHENSIVE METABOLIC PANEL - Abnormal; Notable for the following:    Sodium 136 (*)    Potassium 3.5 (*)    Glucose, Bld 210 (*)    All other components within normal limits  URINALYSIS, ROUTINE W REFLEX MICROSCOPIC - Abnormal; Notable for the following:    APPearance CLOUDY (*)    Hgb urine dipstick SMALL (*)    All other components within normal limits  URINE MICROSCOPIC-ADD ON - Abnormal; Notable for the following:    Bacteria, UA FEW (*)    Casts HYALINE CASTS (*)    All other components within normal limits  POCT PREGNANCY, URINE - Abnormal; Notable for the following:    Preg Test, Ur POSITIVE (*)    All other components within normal limits  GC/CHLAMYDIA PROBE AMP  URINE CULTURE  LIPASE, BLOOD   Imaging Review No results found.  EKG Interpretation   None       MDM   1. Nausea vomiting and diarrhea   2. Abdominal pain    28 yo female with hx of Crohn's disease who is currently pregnant at [redacted]w[redacted]d based on early ultrasound presenting with abdominal pain and nausea.  Pain mostly in epigastrium.  She reports symptoms consistent with prior crohn's flareups.   Although pain and nausea were severe at onset, they were easily controlled with a single dose of Zofran and Morphine, as well as IV fluids.  Her lab tests were reassuring.  She remained comfortable without abdominal pain for remainder of ED course.   As such, there did not seem to be an indication for abdominal imaging.  Given her initial and repeat exams, had low suspicion for cholecystitis, appendicitis, SBO, intraabdominal abscess or perforation.  She has a confirmed IUP.  She reported some light spotting since having a pelvic exam yesterday.  There was none on my exam today.  She was discharged home with return precautions and advised to follow up closely with OB and GI.    Houston Siren III, MD 08/15/13 [phone]

## 2013-08-16 LAB — URINE CULTURE

## 2013-08-16 LAB — GC/CHLAMYDIA PROBE AMP
CT Probe RNA: NEGATIVE
GC PROBE AMP APTIMA: NEGATIVE

## 2013-08-23 ENCOUNTER — Ambulatory Visit: Payer: Medicaid Other | Admitting: Cardiology

## 2013-09-11 ENCOUNTER — Encounter: Payer: Self-pay | Admitting: *Deleted

## 2013-09-11 LAB — OB RESULTS CONSOLE HIV ANTIBODY (ROUTINE TESTING): HIV: NONREACTIVE

## 2013-09-11 LAB — OB RESULTS CONSOLE RPR: RPR: NONREACTIVE

## 2013-09-11 LAB — OB RESULTS CONSOLE GBS: GBS: POSITIVE

## 2013-09-12 ENCOUNTER — Ambulatory Visit: Payer: Medicaid Other | Admitting: Cardiology

## 2013-09-12 ENCOUNTER — Encounter: Payer: Self-pay | Admitting: Cardiology

## 2013-09-12 ENCOUNTER — Ambulatory Visit (INDEPENDENT_AMBULATORY_CARE_PROVIDER_SITE_OTHER): Payer: Medicaid Other | Admitting: Cardiology

## 2013-09-12 VITALS — BP 132/66 | HR 80 | Ht 66.0 in | Wt 161.0 lb

## 2013-09-12 DIAGNOSIS — D4989 Neoplasm of unspecified behavior of other specified sites: Secondary | ICD-10-CM

## 2013-09-12 DIAGNOSIS — Z86018 Personal history of other benign neoplasm: Secondary | ICD-10-CM

## 2013-09-12 DIAGNOSIS — I319 Disease of pericardium, unspecified: Secondary | ICD-10-CM

## 2013-09-12 DIAGNOSIS — Z8679 Personal history of other diseases of the circulatory system: Secondary | ICD-10-CM

## 2013-09-12 DIAGNOSIS — I309 Acute pericarditis, unspecified: Secondary | ICD-10-CM | POA: Insufficient documentation

## 2013-09-12 NOTE — Progress Notes (Signed)
Patient ID: Melanie [name], female   DOB: [birth year], 28 y.o.   MRN: [id number]     Patient Name: Melanie [name] Date of Encounter: 09/12/2013  Primary Care Provider:  Philis Fendt, MD Primary Cardiologist:  Dorothy Spark  Problem List   Past Medical History  Diagnosis Date  . Anemia   . Gestational diabetes mellitus in pregnancy   . Cardiac tumor   . Crohn's disease   . History of genital warts    Past Surgical History  Procedure Laterality Date  . Cardiac surgery      at 28 years old    Allergies  No Known Allergies  HPI  28 year old female currently [redacted] weeks pregnant who is coming for evaluation of chest pain. Her chest pains started approximately 2 months ago and is associated with taking deep breath, and certain positions like laying on the left side. Her pain has improved in the last months and currently she only feels it occasionally. Her pain is not associated with shortness of breath or palpitation. 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 Patient states that it's a hair history in her family, her mother, maternal grandfather and great-grandfather had seen surgery in the past. The surgery was performed in June of in 2004. Patient states that she has been followed with cardiology since then. Disease in her pregnancy #4 she has 3 healthy children at home, age 43,5, and 2. She hasn't had these problems in her previous pregnancies. She denies exertional chest pain, shortness of breath or palpitations. She denies lower extremity edema orthopnea or paroxysmal nocturnal dyspnea.  Home Medications  Prior to Admission medications   Medication Sig Start Date End Date Taking? Authorizing Provider  anti-nausea (EMETROL) solution Take 10 mLs by mouth every 15 (fifteen) minutes as needed for nausea or vomiting.   Yes Historical Provider, MD  ondansetron (ZOFRAN) 4 MG tablet Take 1 tablet (4 mg total) by  mouth every 6 (six) hours. 08/15/13  Yes Houston Siren III, MD  Prenatal Vit-Fe Fumarate-FA (MULTIVITAMIN-PRENATAL) 27-0.8 MG TABS tablet Take 1 tablet by mouth daily at 12 noon.   Yes Historical Provider, MD  simethicone (MYLICON) 329 MG chewable tablet Chew 125 mg by mouth every 6 (six) hours as needed for flatulence.   Yes Historical Provider, MD    Family History  Family History  Problem Relation Age of Onset  . Hypertension Mother   . Heart Problems Mother     tumor, open heart surgery  . Heart Problems Maternal Aunt     tumor  . Diabetes Father   . Diabetes Paternal Grandmother   . Diabetes Maternal Grandmother     Social History  History   Social History  . Marital Status: Single    Spouse Name: N/A    Number of Children: 3  . Years of Education: N/A   Occupational History  . Not on file.   Social History Main Topics  . Smoking status: Current Some Day Smoker  . Smokeless tobacco: Never Used  . Alcohol Use: Yes     Comment: occasionally  . Drug Use: 1.00 per week    Special: Marijuana     Comment: states she smoked 2 weeks ago when she was overly stressed, none since then.  . Sexual Activity: Yes    Birth Control/ Protection: None   Other Topics Concern  . Not on file   Social History Narrative  .  No narrative on file     Review of Systems, as per HPI, otherwise negative General:  No chills, fever, night sweats or weight changes.  Cardiovascular:  No chest pain, dyspnea on exertion, edema, orthopnea, palpitations, paroxysmal nocturnal dyspnea. Dermatological: No rash, lesions/masses Respiratory: No cough, dyspnea Urologic: No hematuria, dysuria Abdominal:   No nausea, vomiting, diarrhea, bright red blood per rectum, melena, or hematemesis Neurologic:  No visual changes, wkns, changes in mental status. All other systems reviewed and are otherwise negative except as noted above.  Physical Exam  Blood pressure 132/66, pulse 80, height 5\' 6"  (1.676  m), weight 161 lb (73.029 kg), last menstrual period 09/29/2012.  General: Pleasant, NAD Psych: Normal affect. Neuro: Alert and oriented X 3. Moves all extremities spontaneously. HEENT: Normal  Neck: Supple without bruits or JVD. Lungs:  Resp regular and unlabored, CTA. Heart: RRR no s3, s4, 2/6 systolic murmur, no rub. Abdomen: Soft, non-tender, non-distended, BS + x 4.  Extremities: No clubbing, cyanosis or edema. DP/PT/Radials 2+ and equal bilaterally.  Labs:  No results found for this basename: CKTOTAL, CKMB, TROPONINI,  in the last 72 hours Lab Results  Component Value Date   WBC 13.0* 08/15/2013   HGB 13.5 08/15/2013   HCT 41.0 08/15/2013   MCV 94.7 08/15/2013   PLT 251 08/15/2013   Accessory Clinical Findings  Echocardiogram - 06/15/2005 - Left ventricular size was normal. - Overall left ventricular systolic function was normal. - Left ventricular ejection fraction was estimated , range being 55 % to 65 %.. - There were no left ventricular regional wall motion abnormalities. - Left ventricular wall thickness was normal. AORTIC VALVE: - The aortic valve was trileaflet. - Aortic valve thickness was normal. - There was normal aortic valve leaflet excursion. Doppler interpretation(s): - Transaortic velocity was within the normal range. - There was no evidence for aortic valve stenosis. - There was no significant aortic valvular regurgitation. AORTA: - The aortic root was normal in size. MITRAL VALVE: - There was mild thickening of the mitral valve involving the anterior leaflet,chord, and papilary muscle. - There was normal mitral valve leaflet excursion. Doppler interpretation(s): - The transmitral velocity was within the normal range. - There was no evidence for mitral stenosis. - There was no significant mitral valvular regurgitation. LEFT ATRIUM: - Left atrial size was normal. PULMONARY VEINS: - The pulmonary veins were grossly normal. RIGHT VENTRICLE: - Right  ventricular size was normal. - Right ventricular systolic function was normal. - Right ventricular wall thickness was normal. PULMONIC VALVE: - The structure of the pulmonic valve appeared to be normal. Doppler interpretation(s): - The transpulmonic velocity was within the normal range. - There was no pulmonic valve stenosis. - There was no significant pulmonic regurgitation. TRICUSPID VALVE: - The tricuspid valve structure was normal. - Tricuspid leaflet excursion was normal. Doppler interpretation(s): - The transtricuspid velocity was within the normal range. - There was no evidence for tricuspid stenosis. - There was no significant tricuspid valvular regurgitation. PULMONARY ARTERY: - The pulmonary artery was grossly normal. RIGHT ATRIUM: - Right atrial size was normal. SYSTEMIC VEINS: - The systemic veins were grossly normal. - The inferior vena cava was normal. PERICARDIUM: - There was no pericardial effusion. - The pericardium was normal in appearance.   ECG - sinus rhythm, diffuse ST elevations suspicious for pericarditis   Assessment & Plan  A 28 year old female, [redacted] week pregnant with   1. 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 The cardiac myxomas generally are diagnosed at an earlier age than sporadic myxomas and have a higher tendency to recur . We will try to obtain records from [facility]. We will obtain echocardiogram to evaluate for possible recurrence. (the last one is from 2006).  2. Pericarditis - with improving chest pain - no preceding viral illness. We will order echocardiogram, her symptoms are improving, we won't treat during her pregnancy, but if her symptoms recur we will consider NSAIDS in the second trimester.  3. Systolic murmur - possibly physiologic murmur of  pregnancy, we will follow echocardiogram considering the above history  Follow up in 1 month.  Dorothy Spark, MD, [facility] 09/12/2013, 4:52 PM

## 2013-09-12 NOTE — Patient Instructions (Signed)
**Note De-identified  Obfuscation** Your physician has requested that you have an echocardiogram. Echocardiography is a painless test that uses sound waves to create images of your heart. It provides your doctor with information about the size and shape of your heart and how well your heart's chambers and valves are working. This procedure takes approximately one hour. There are no restrictions for this procedure.    Your physician recommends that you schedule a follow-up appointment in: 1 month  

## 2013-09-23 ENCOUNTER — Inpatient Hospital Stay (HOSPITAL_COMMUNITY)
Admission: AD | Admit: 2013-09-23 | Discharge: 2013-09-23 | Disposition: A | Discharge status: Home / Self Care | Payer: Medicaid Other | Source: Ambulatory Visit | Attending: Obstetrics & Gynecology | Admitting: Obstetrics & Gynecology

## 2013-09-23 ENCOUNTER — Encounter (HOSPITAL_COMMUNITY): Payer: Self-pay

## 2013-09-23 ENCOUNTER — Inpatient Hospital Stay (HOSPITAL_COMMUNITY): Payer: Medicaid Other

## 2013-09-23 DIAGNOSIS — D649 Anemia, unspecified: Secondary | ICD-10-CM | POA: Insufficient documentation

## 2013-09-23 DIAGNOSIS — O99019 Anemia complicating pregnancy, unspecified trimester: Secondary | ICD-10-CM | POA: Insufficient documentation

## 2013-09-23 DIAGNOSIS — R109 Unspecified abdominal pain: Secondary | ICD-10-CM | POA: Insufficient documentation

## 2013-09-23 DIAGNOSIS — K5090 Crohn's disease, unspecified, without complications: Secondary | ICD-10-CM | POA: Insufficient documentation

## 2013-09-23 DIAGNOSIS — O209 Hemorrhage in early pregnancy, unspecified: Secondary | ICD-10-CM

## 2013-09-23 DIAGNOSIS — O99330 Smoking (tobacco) complicating pregnancy, unspecified trimester: Secondary | ICD-10-CM | POA: Insufficient documentation

## 2013-09-23 LAB — CBC
HEMATOCRIT: 40.4 % (ref 36.0–46.0)
HEMOGLOBIN: 13.8 g/dL (ref 12.0–15.0)
MCH: 32.1 pg (ref 26.0–34.0)
MCHC: 34.2 g/dL (ref 30.0–36.0)
MCV: 94 fL (ref 78.0–100.0)
Platelets: 258 10*3/uL (ref 150–400)
RBC: 4.3 MIL/uL (ref 3.87–5.11)
RDW: 14.3 % (ref 11.5–15.5)
WBC: 11 10*3/uL — ABNORMAL HIGH (ref 4.0–10.5)

## 2013-09-23 NOTE — MAU Provider Note (Signed)
History     CSN: [id number]  Arrival date and time: 09/23/13 2031   First Provider Initiated Contact with Patient 09/23/13 2108      Chief Complaint  Patient presents with  . Vaginal Bleeding  . Abdominal Pain   Vaginal Bleeding Associated symptoms include abdominal pain.  Abdominal Pain    Melanie [name] is a 28 y.o. [phone] at [redacted]w[redacted]d who presents today with vaginal bleeding and cramping. She states that she has been cramping since this morning, and then she had intercourse. Around 1800 tonight she started bleeding. She states that it was as heavy as a period, and she continued to have pain. She states that she has had one visit "somewhere" for prenatal care, but cannot remember where she has been. She has another appointment scheduled with that office.   Past Medical History  Diagnosis Date  . Anemia   . Gestational diabetes mellitus in pregnancy   . Cardiac tumor   . Crohn's disease   . History of genital warts     Past Surgical History  Procedure Laterality Date  . Cardiac surgery      at 28 years old    Family History  Problem Relation Age of Onset  . Hypertension Mother   . Heart Problems Mother     tumor, open heart surgery  . Heart Problems Maternal Aunt     tumor  . Diabetes Father   . Diabetes Paternal Grandmother   . Diabetes Maternal Grandmother     History  Substance Use Topics  . Smoking status: Current Some Day Smoker  . Smokeless tobacco: Never Used  . Alcohol Use: Yes     Comment: occasionally    Allergies: No Known Allergies  Prescriptions prior to admission  Medication Sig Dispense Refill  . Prenatal Vit-Fe Fumarate-FA (MULTIVITAMIN-PRENATAL) 27-0.8 MG TABS tablet Take 1 tablet by mouth daily at 12 noon.        Review of Systems  Gastrointestinal: Positive for abdominal pain.  Genitourinary: Positive for vaginal bleeding.   Physical Exam   Blood pressure 112/60, pulse 83, temperature 98.4 F (36.9 C), temperature source  Oral, resp. rate 18, last menstrual period 09/29/2012, SpO2 98.00%.  Physical Exam  Nursing note and vitals reviewed. Constitutional: She is oriented to person, place, and time. She appears well-developed and well-nourished. No distress.  Cardiovascular: Normal rate.   Respiratory: Effort normal.  GI: Soft. There is no tenderness.  Genitourinary:   External: no lesion Vagina:  Moderate amount of blood in the vagina, and seen coming from the uterus.  Cervix: pink, smooth, digital exam deferred. Will ultrasound to confirm placenta location.     Neurological: She is alert and oriented to person, place, and time.  Skin: Skin is warm and dry.  Psychiatric: She has a normal mood and affect.    MAU Course  Procedures    2241: D/W Dr. Burnett Harry about recommendation for FU ASAP. She states that the bleeding is fine, but that she recommends that the patient have a formal ultrasound for dating as soon as possible.  Assessment and Plan   1. Vaginal bleeding in pregnant patient at less than [redacted] weeks gestation    Bleeding precautions Pelvic rest Return to MAU as needed FU with the prenatal care provider of your choice.   Follow-up Information   Follow up with [facility]. (As needed)    Contact information:   [address] [id number] [place] [address] [phone]  Mathis Bud 09/23/2013, 9:23 PM

## 2013-09-23 NOTE — Discharge Instructions (Signed)
Pelvic Rest °Pelvic rest is sometimes recommended for women when:  °· The placenta is partially or completely covering the opening of the cervix (placenta previa). °· There is bleeding between the uterine wall and the amniotic sac in the first trimester (subchorionic hemorrhage). °· The cervix begins to open without labor starting (incompetent cervix, cervical insufficiency). °· The labor is too early (preterm labor). °HOME CARE INSTRUCTIONS °· Do not have sexual intercourse, stimulation, or an orgasm. °· Do not use tampons, douche, or put anything in the vagina. °· Do not lift anything over 10 pounds (4.5 kg). °· Avoid strenuous activity or straining your pelvic muscles. °SEEK MEDICAL CARE IF:  °· You have any vaginal bleeding during pregnancy. Treat this as a potential emergency. °· You have cramping pain felt low in the stomach (stronger than menstrual cramps). °· You notice vaginal discharge (watery, mucus, or bloody). °· You have a low, dull backache. °· There are regular contractions or uterine tightening. °SEEK IMMEDIATE MEDICAL CARE IF: °You have vaginal bleeding and have placenta previa.  °Document Released: 10/23/2010 Document Revised: 09/20/2011 Document Reviewed: 10/23/2010 °Exit[employer]® Patient Information ©2014 [employer]. ° °

## 2013-09-23 NOTE — MAU Note (Signed)
Pt presents via EMS with complaints of vaginal bleeding and cramping after intercourse. States it's just spotting now that is slowing down.

## 2013-09-24 NOTE — MAU Provider Note (Signed)
Attestation of Attending Supervision of Advanced Practitioner (CNM/NP): Evaluation and management procedures were performed by the Advanced Practitioner under my supervision and collaboration.  I have reviewed the Advanced Practitioner's note and chart, and I agree with the management and plan.  HARRAWAY-SMITH, Averi Kilty 7:10 AM

## 2013-10-01 ENCOUNTER — Ambulatory Visit: Payer: Medicaid Other | Admitting: Internal Medicine

## 2013-10-01 ENCOUNTER — Ambulatory Visit (HOSPITAL_COMMUNITY): Payer: Medicaid Other | Attending: Cardiology | Admitting: Cardiology

## 2013-10-01 ENCOUNTER — Encounter: Payer: Self-pay | Admitting: Cardiology

## 2013-10-01 DIAGNOSIS — I319 Disease of pericardium, unspecified: Secondary | ICD-10-CM | POA: Insufficient documentation

## 2013-10-01 DIAGNOSIS — I309 Acute pericarditis, unspecified: Secondary | ICD-10-CM

## 2013-10-01 DIAGNOSIS — D4989 Neoplasm of unspecified behavior of other specified sites: Secondary | ICD-10-CM

## 2013-10-01 DIAGNOSIS — Z86018 Personal history of other benign neoplasm: Secondary | ICD-10-CM

## 2013-10-01 DIAGNOSIS — Z8679 Personal history of other diseases of the circulatory system: Secondary | ICD-10-CM

## 2013-10-01 NOTE — Progress Notes (Signed)
Echo performed. 

## 2013-10-03 ENCOUNTER — Ambulatory Visit: Payer: Medicaid Other | Admitting: Cardiology

## 2013-10-03 ENCOUNTER — Telehealth: Payer: Self-pay

## 2013-10-03 NOTE — Telephone Encounter (Addendum)
**Note De-Identified  Obfuscation** The pt is pregnant at this time so per Dr Meda Coffee the cardiac MRI can wait and the pt is coming back for f/u on 4/6 at 9:45. The pt is aware and agrees with plan.

## 2013-10-03 NOTE — Telephone Encounter (Signed)
**Note De-Identified Quatavious Rossa Obfuscation** Message copied by Milanna Kozlov, Deliah Boston on Wed Oct 03, 2013  5:44 PM ------      Message from: Dorothy Spark      Created: Wed Oct 03, 2013  1:32 PM       This patient has a possible mass at the left atrial wall, we need to order a cardiac MRI. Would you call her?      Thank you,      Houston Siren ------

## 2013-10-03 NOTE — Telephone Encounter (Signed)
**Note De-Identified Katharina Jehle Obfuscation** Message copied by Jeremih Dearmas, Deliah Boston on Wed Oct 03, 2013  5:06 PM ------      Message from: Dorothy Spark      Created: Wed Oct 03, 2013  1:32 PM       This patient has a possible mass at the left atrial wall, we need to order a cardiac MRI. Would you call her?      Thank you,      Houston Siren ------

## 2013-10-04 MED ORDER — LIDOCAINE HCL (CARDIAC) 20 MG/ML IV SOLN
INTRAVENOUS | Status: AC
  Filled 2013-10-04: qty 5

## 2013-10-04 MED ORDER — PROPOFOL 10 MG/ML IV BOLUS
INTRAVENOUS | Status: AC
  Filled 2013-10-04: qty 20

## 2013-10-12 ENCOUNTER — Inpatient Hospital Stay (HOSPITAL_COMMUNITY)
Admission: AD | Admit: 2013-10-12 | Discharge: 2013-10-12 | Disposition: A | Discharge status: Home / Self Care | Payer: Medicaid Other | Source: Ambulatory Visit | Attending: Obstetrics and Gynecology | Admitting: Obstetrics and Gynecology

## 2013-10-12 ENCOUNTER — Inpatient Hospital Stay (HOSPITAL_COMMUNITY): Payer: Medicaid Other

## 2013-10-12 ENCOUNTER — Telehealth: Payer: Self-pay | Admitting: Obstetrics and Gynecology

## 2013-10-12 ENCOUNTER — Encounter (HOSPITAL_COMMUNITY): Payer: Self-pay

## 2013-10-12 DIAGNOSIS — K5090 Crohn's disease, unspecified, without complications: Secondary | ICD-10-CM | POA: Insufficient documentation

## 2013-10-12 DIAGNOSIS — O368990 Maternal care for other specified fetal problems, unspecified trimester, not applicable or unspecified: Secondary | ICD-10-CM | POA: Insufficient documentation

## 2013-10-12 DIAGNOSIS — O2390 Unspecified genitourinary tract infection in pregnancy, unspecified trimester: Secondary | ICD-10-CM | POA: Insufficient documentation

## 2013-10-12 DIAGNOSIS — A499 Bacterial infection, unspecified: Secondary | ICD-10-CM | POA: Insufficient documentation

## 2013-10-12 DIAGNOSIS — O418X90 Other specified disorders of amniotic fluid and membranes, unspecified trimester, not applicable or unspecified: Secondary | ICD-10-CM

## 2013-10-12 DIAGNOSIS — O4690 Antepartum hemorrhage, unspecified, unspecified trimester: Secondary | ICD-10-CM

## 2013-10-12 DIAGNOSIS — O43899 Other placental disorders, unspecified trimester: Secondary | ICD-10-CM

## 2013-10-12 DIAGNOSIS — B373 Candidiasis of vulva and vagina: Secondary | ICD-10-CM | POA: Insufficient documentation

## 2013-10-12 DIAGNOSIS — O468X9 Other antepartum hemorrhage, unspecified trimester: Secondary | ICD-10-CM

## 2013-10-12 DIAGNOSIS — O209 Hemorrhage in early pregnancy, unspecified: Secondary | ICD-10-CM | POA: Insufficient documentation

## 2013-10-12 DIAGNOSIS — N760 Acute vaginitis: Secondary | ICD-10-CM | POA: Insufficient documentation

## 2013-10-12 DIAGNOSIS — B9689 Other specified bacterial agents as the cause of diseases classified elsewhere: Secondary | ICD-10-CM | POA: Insufficient documentation

## 2013-10-12 DIAGNOSIS — O99330 Smoking (tobacco) complicating pregnancy, unspecified trimester: Secondary | ICD-10-CM | POA: Insufficient documentation

## 2013-10-12 DIAGNOSIS — B3731 Acute candidiasis of vulva and vagina: Secondary | ICD-10-CM | POA: Insufficient documentation

## 2013-10-12 DIAGNOSIS — L293 Anogenital pruritus, unspecified: Secondary | ICD-10-CM | POA: Insufficient documentation

## 2013-10-12 LAB — WET PREP, GENITAL: TRICH WET PREP: NONE SEEN

## 2013-10-12 MED ORDER — FLUCONAZOLE 150 MG PO TABS: 150.0000 mg | ORAL_TABLET | Freq: Every day | ORAL | Status: DC

## 2013-10-12 MED ORDER — FLUCONAZOLE 150 MG PO TABS
150.0000 mg | ORAL_TABLET | Freq: Every day | ORAL | Status: DC
  Administered 2013-10-12: 150 mg via ORAL
  Filled 2013-10-12: qty 1

## 2013-10-12 MED ORDER — METRONIDAZOLE 500 MG PO TABS: 500.0000 mg | ORAL_TABLET | Freq: Two times a day (BID) | ORAL | Status: DC

## 2013-10-12 NOTE — MAU Provider Note (Signed)
History     CSN: [id number]  Arrival date and time: 10/12/13 2778   First Provider Initiated Contact with Patient 10/12/13 [phone]      Chief Complaint  Patient presents with  . Vaginal Bleeding   HPI  Ms. Melanie [name] is a 28 y.o. female [id number] at [redacted]w[redacted]d who presents following one episode of bright red vaginal bleeding at 0600 this morning. She got up to use the bathroom and noticed it when she wiped. She reports good fetal movement, denies LOF, vaginal bleeding (currently), h/a, dizziness, n/v, or fever/chills.  Denies intercourse recently, denies any other episodes of bleeding since 0600. She does report feelings of a "yeast infection", she has had itching for the past few days.  OB History   Grav Para Term Preterm Abortions TAB SAB Ect Mult Living   4 3 2 1      3       Past Medical History  Diagnosis Date  . Anemia   . Gestational diabetes mellitus in pregnancy   . Cardiac tumor   . Crohn's disease   . History of genital warts     Past Surgical History  Procedure Laterality Date  . Cardiac surgery      at 28 years old    Family History  Problem Relation Age of Onset  . Hypertension Mother   . Heart Problems Mother     tumor, open heart surgery  . Heart Problems Maternal Aunt     tumor  . Diabetes Father   . Diabetes Paternal Grandmother   . Diabetes Maternal Grandmother     History  Substance Use Topics  . Smoking status: Current Some Day Smoker  . Smokeless tobacco: Never Used  . Alcohol Use: Yes     Comment: occasionally    Allergies: No Known Allergies  Prescriptions prior to admission  Medication Sig Dispense Refill  . acetaminophen (TYLENOL) 500 MG tablet Take 500 mg by mouth every 4 (four) hours as needed for moderate pain.      . Prenatal Vit-Fe Fumarate-FA (PRENATAL MULTIVITAMIN) TABS tablet Take 1 tablet by mouth daily at 12 noon.       Results for orders placed during the hospital encounter of 10/12/13 (from the past 72 hour(s))  WET  PREP, GENITAL     Status: Abnormal   Collection Time    10/12/13  8:40 AM      Result Value Ref Range   Yeast Wet Prep HPF POC MODERATE (*) NONE SEEN   Trich, Wet Prep NONE SEEN  NONE SEEN   Clue Cells Wet Prep HPF POC MODERATE (*) NONE SEEN   WBC, Wet Prep HPF POC FEW (*) NONE SEEN   Comment: MODERATE BACTERIA SEEN          Review of Systems  Constitutional: Negative for fever and chills.  Gastrointestinal: Positive for diarrhea. Negative for nausea, vomiting, abdominal pain and constipation.  Genitourinary: Negative for dysuria, urgency, frequency and hematuria.  Neurological: Negative for dizziness.   Physical Exam   Blood pressure 111/78, pulse 93, temperature 99 F (37.2 C), temperature source Oral, resp. rate 16, height 5' 4.5" (1.638 m), weight 75.66 kg (166 lb 12.8 oz), SpO2 98.00%.  Physical Exam  Constitutional: She is oriented to person, place, and time. She appears well-developed and well-nourished. No distress.  HENT:  Head: Normocephalic.  Eyes: Pupils are equal, round, and reactive to light.  Neck: Neck supple.  Respiratory: Effort normal.  GI: Soft. She exhibits no distension.  There is no tenderness. There is no rebound and no guarding.  Genitourinary:  Speculum exam: Vagina - Moderate amount of dark red blood pooling in the vaginal canal. Thick, white discharge on vaginal wall.  Cervix - small active bleeding Bimanual exam: Deferred  Wet prep done Chaperone present for exam.   Musculoskeletal: Normal range of motion.  Neurological: She is alert and oriented to person, place, and time.  Skin: Skin is warm. She is not diaphoretic.  Psychiatric: Her behavior is normal.    MAU Course  Procedures none  MDM fht 145 via doppler  US to access placenta  Wet prep + yeast vaginitis on wet prep Diflucan 150 mg in MAU O positive blood type  Consulted with Dr. Ouida Sills and discussed plan of care.    Assessment and Plan   A:  1. Vaginal bleeding  in pregnancy   2. Subchorionic hematoma   3.  Yeast vaginitis 4.  Bacterial vaginosis   P:  Discharge home in stable condition RX: Flagyl        Diflucan Pelvic rest  Bleeding precautions discussed Keep your follow up appt with your Dr. Return to MAU as needed, if symptoms worsen.   Darrelyn Hillock Rasch, NP  10/12/2013, 1:18 PM

## 2013-10-12 NOTE — Discharge Instructions (Signed)
Pelvic Rest °Pelvic rest is sometimes recommended for women when:  °· The placenta is partially or completely covering the opening of the cervix (placenta previa). °· There is bleeding between the uterine wall and the amniotic sac in the first trimester (subchorionic hemorrhage). °· The cervix begins to open without labor starting (incompetent cervix, cervical insufficiency). °· The labor is too early (preterm labor). °HOME CARE INSTRUCTIONS °· Do not have sexual intercourse, stimulation, or an orgasm. °· Do not use tampons, douche, or put anything in the vagina. °· Do not lift anything over 10 pounds (4.5 kg). °· Avoid strenuous activity or straining your pelvic muscles. °SEEK MEDICAL CARE IF:  °· You have any vaginal bleeding during pregnancy. Treat this as a potential emergency. °· You have cramping pain felt low in the stomach (stronger than menstrual cramps). °· You notice vaginal discharge (watery, mucus, or bloody). °· You have a low, dull backache. °· There are regular contractions or uterine tightening. °SEEK IMMEDIATE MEDICAL CARE IF: °You have vaginal bleeding and have placenta previa.  °Document Released: 10/23/2010 Document Revised: 09/20/2011 Document Reviewed: 10/23/2010 °Exit[employer]® Patient Information ©2014 [employer]. ° °

## 2013-10-12 NOTE — MAU Note (Signed)
Patient states when she woke up this am she had a spot on bleeding on the floor and was dripping. Went to the BR and cleaned up and has not seen any bleeding since. Patient denies any pain.

## 2013-10-14 ENCOUNTER — Inpatient Hospital Stay (HOSPITAL_COMMUNITY)
Admission: AD | Admit: 2013-10-14 | Discharge: 2013-10-14 | Disposition: A | Discharge status: Home / Self Care | Payer: Medicaid Other | Source: Ambulatory Visit | Attending: Obstetrics and Gynecology | Admitting: Obstetrics and Gynecology

## 2013-10-14 ENCOUNTER — Inpatient Hospital Stay (HOSPITAL_COMMUNITY): Payer: Medicaid Other

## 2013-10-14 ENCOUNTER — Encounter (HOSPITAL_COMMUNITY): Payer: Self-pay | Admitting: *Deleted

## 2013-10-14 DIAGNOSIS — O209 Hemorrhage in early pregnancy, unspecified: Secondary | ICD-10-CM

## 2013-10-14 DIAGNOSIS — O99330 Smoking (tobacco) complicating pregnancy, unspecified trimester: Secondary | ICD-10-CM | POA: Insufficient documentation

## 2013-10-14 DIAGNOSIS — R109 Unspecified abdominal pain: Secondary | ICD-10-CM | POA: Insufficient documentation

## 2013-10-14 DIAGNOSIS — K5090 Crohn's disease, unspecified, without complications: Secondary | ICD-10-CM | POA: Insufficient documentation

## 2013-10-14 MED ORDER — OXYCODONE-ACETAMINOPHEN 5-325 MG PO TABS: 2.0000 | ORAL_TABLET | ORAL | Status: DC | PRN

## 2013-10-14 MED ORDER — PROMETHAZINE HCL 25 MG PO TABS: 25.0000 mg | ORAL_TABLET | Freq: Four times a day (QID) | ORAL | Status: DC | PRN

## 2013-10-14 MED ORDER — OXYCODONE-ACETAMINOPHEN 5-325 MG PO TABS
1.0000 | ORAL_TABLET | Freq: Once | ORAL | Status: AC
Start: 2013-10-14 — End: 2013-10-14
  Administered 2013-10-14: 1 via ORAL
  Filled 2013-10-14: qty 1

## 2013-10-14 NOTE — MAU Note (Signed)
Patient presents to MAU via EMS with c/o vaginal bleeding that is dark red and lower abdominal pain that is cramping. Red bleeding noted to cover middle of pad during triage.  The bleeding has gotten heavier since fridays visit.

## 2013-10-14 NOTE — Discharge Instructions (Signed)
Miscarriage  A miscarriage is the loss of an unborn baby (fetus) before the 20th week of pregnancy. The cause is often unknown.   HOME CARE  · You may need to stay in bed (bed rest), or you may be able to do light activity. Go about activity as told by your doctor.  · Have help at home.  · Write down how many pads you use each day. Write down how soaked they are.  · Do not use tampons. Do not wash out your vagina (douche) or have sex (intercourse) until your doctor approves.  · Only take medicine as told by your doctor.  · Do not take aspirin.  · Keep all doctor visits as told.  · If you or your partner have problems with grieving, talk to your doctor. You can also try counseling. Give yourself time to grieve before trying to get pregnant again.  GET HELP RIGHT AWAY IF:  · You have bad cramps or pain in your back or belly (abdomen).  · You have a fever.  · You pass large clumps of blood (clots) from your vagina that are walnut-sized or larger. Save the clumps for your doctor to see.  · You pass large amounts of tissue from your vagina. Save the tissue for your doctor to see.  · You have more bleeding.  · You have thick, bad-smelling fluid (discharge) coming from the vagina.  · You get lightheaded, weak, or you pass out (faint).  · You have chills.  MAKE SURE YOU:  · Understand these instructions.  · Will watch your condition.  · Will get help right away if you are not doing well or get worse.  Document Released: 09/20/2011 Document Reviewed: 09/20/2011  [employer]® Patient Information ©2014 [employer].

## 2013-10-14 NOTE — MAU Provider Note (Signed)
History     CSN: [id number]  Arrival date and time: 10/14/13 0744   First Provider Initiated Contact with Patient 10/14/13 [phone]      Chief Complaint  Patient presents with  . Vaginal Bleeding  . Abdominal Pain   HPI Comments: Melanie [name] 28 y.o. [redacted]w[redacted]d  [id number] presents to MAU via EMS for vaginal bleeding and cramping that started this morning. She was in MAU on 4/3 for same and was diagnosed with [facility] and sent home with pelvic rest. She was doing well till this am when she started bleeding briskly and came in. Her pain is "5" on 1-10 scale and she did not have pain before. Her blood type is O+  Vaginal Bleeding Associated symptoms include abdominal pain.  Abdominal Pain      Past Medical History  Diagnosis Date  . Anemia   . Gestational diabetes mellitus in pregnancy   . Cardiac tumor   . Crohn's disease   . History of genital warts     Past Surgical History  Procedure Laterality Date  . Cardiac surgery      at 28 years old    Family History  Problem Relation Age of Onset  . Hypertension Mother   . Heart Problems Mother     tumor, open heart surgery  . Heart Problems Maternal Aunt     tumor  . Diabetes Father   . Diabetes Paternal Grandmother   . Diabetes Maternal Grandmother     History  Substance Use Topics  . Smoking status: Current Some Day Smoker  . Smokeless tobacco: Never Used  . Alcohol Use: Yes     Comment: occasionally    Allergies: No Known Allergies  Prescriptions prior to admission  Medication Sig Dispense Refill  . acetaminophen (TYLENOL) 500 MG tablet Take 500 mg by mouth every 4 (four) hours as needed for moderate pain.      . fluconazole (DIFLUCAN) 150 MG tablet Take 1 tablet (150 mg total) by mouth daily.  1 tablet  1  . metroNIDAZOLE (FLAGYL) 500 MG tablet Take 1 tablet (500 mg total) by mouth 2 (two) times daily.  14 tablet  0  . Prenatal Vit-Fe Fumarate-FA (PRENATAL MULTIVITAMIN) TABS tablet Take 1 tablet by mouth daily at  12 noon.        Review of Systems  Constitutional: Negative.   HENT: Negative.   Eyes: Negative.   Respiratory: Negative.   Cardiovascular: Negative.   Gastrointestinal: Positive for abdominal pain.  Genitourinary: Negative.        Vaginal bleeding  Musculoskeletal: Negative.   Skin: Negative.   Neurological: Negative.   Psychiatric/Behavioral: Negative.    Physical Exam   Blood pressure 124/71, pulse 79, temperature 97.8 F (36.6 C), temperature source Oral, resp. rate 16, height 5' 4.5" (1.638 m), weight 75.297 kg (166 lb), SpO2 100.00%.  Physical Exam  Constitutional: She is oriented to person, place, and time. She appears well-developed and well-nourished. No distress.  HENT:  Head: Normocephalic and atraumatic.  Eyes: Pupils are equal, round, and reactive to light.  GI: Soft. There is tenderness. There is no rebound and no guarding.  Genitourinary:  Genital:external bloody Vaginal:moderate amount bright red blood Cervix:closed    Musculoskeletal: Normal range of motion.  Neurological: She is alert and oriented to person, place, and time.  Skin: Skin is warm and dry.  Psychiatric: She has a normal mood and affect. Her behavior is normal. Judgment and thought content normal.  +FHT  U/S  Preliminary report only at this time. Will wait for Final report from MFM  MAU Course  Procedures  MDM  Percocet X 1 dose Repeat U/S Discussed case with Dr Ouida Sills who will be down to discuss results with patient  Assessment and Plan   A: Bleeding in Second Trimester  P: Dr Ouida Sills will see patient Percocet/ phenergan for pain Bleeding precautions/ pelvic rest Follow up with [facility] 10/14/2013, 8:49 AM

## 2013-10-14 NOTE — Progress Notes (Signed)
Pt. C/o more vaginal bleeding during transabdominal US.  Cervix appeared closed on transabdominal US.  Transvaginal US was performed to evaluate for hemorrhage at inferior margin of placenta and placenta/cervix relationship.

## 2013-10-15 ENCOUNTER — Encounter: Payer: Self-pay | Admitting: Cardiology

## 2013-10-15 ENCOUNTER — Encounter (HOSPITAL_COMMUNITY): Payer: Self-pay | Admitting: *Deleted

## 2013-10-15 ENCOUNTER — Inpatient Hospital Stay (HOSPITAL_COMMUNITY)
Admission: AD | Admit: 2013-10-15 | Discharge: 2013-10-17 | DRG: 781 | Disposition: A | Discharge status: Home / Self Care | Payer: Medicaid Other | Source: Ambulatory Visit | Attending: Obstetrics & Gynecology | Admitting: Obstetrics & Gynecology

## 2013-10-15 ENCOUNTER — Ambulatory Visit (INDEPENDENT_AMBULATORY_CARE_PROVIDER_SITE_OTHER): Payer: Medicaid Other | Admitting: Cardiology

## 2013-10-15 VITALS — BP 108/61 | HR 107 | Ht 65.0 in | Wt 167.8 lb

## 2013-10-15 DIAGNOSIS — O4692 Antepartum hemorrhage, unspecified, second trimester: Secondary | ICD-10-CM | POA: Diagnosis present

## 2013-10-15 DIAGNOSIS — O208 Other hemorrhage in early pregnancy: Secondary | ICD-10-CM | POA: Diagnosis present

## 2013-10-15 DIAGNOSIS — O99340 Other mental disorders complicating pregnancy, unspecified trimester: Secondary | ICD-10-CM | POA: Diagnosis present

## 2013-10-15 DIAGNOSIS — I079 Rheumatic tricuspid valve disease, unspecified: Secondary | ICD-10-CM

## 2013-10-15 DIAGNOSIS — O4590 Premature separation of placenta, unspecified, unspecified trimester: Principal | ICD-10-CM | POA: Diagnosis present

## 2013-10-15 DIAGNOSIS — O99810 Abnormal glucose complicating pregnancy: Secondary | ICD-10-CM | POA: Diagnosis present

## 2013-10-15 DIAGNOSIS — O99330 Smoking (tobacco) complicating pregnancy, unspecified trimester: Secondary | ICD-10-CM | POA: Diagnosis present

## 2013-10-15 DIAGNOSIS — F1210 Cannabis abuse, uncomplicated: Secondary | ICD-10-CM | POA: Diagnosis present

## 2013-10-15 DIAGNOSIS — Z87898 Personal history of other specified conditions: Secondary | ICD-10-CM

## 2013-10-15 DIAGNOSIS — Z86018 Personal history of other benign neoplasm: Secondary | ICD-10-CM | POA: Insufficient documentation

## 2013-10-15 DIAGNOSIS — K5090 Crohn's disease, unspecified, without complications: Secondary | ICD-10-CM | POA: Diagnosis present

## 2013-10-15 DIAGNOSIS — I071 Rheumatic tricuspid insufficiency: Secondary | ICD-10-CM

## 2013-10-15 LAB — TYPE AND SCREEN
ABO/RH(D): O POS
Antibody Screen: NEGATIVE

## 2013-10-15 LAB — FIBRINOGEN: Fibrinogen: 307 mg/dL (ref 204–475)

## 2013-10-15 MED ORDER — PRENATAL MULTIVITAMIN CH
1.0000 | ORAL_TABLET | Freq: Every day | ORAL | Status: DC
  Administered 2013-10-16 – 2013-10-17 (×2): 1 via ORAL
  Filled 2013-10-15 (×2): qty 1

## 2013-10-15 MED ORDER — DOCUSATE SODIUM 100 MG PO CAPS
100.0000 mg | ORAL_CAPSULE | Freq: Every day | ORAL | Status: DC
  Administered 2013-10-16: 100 mg via ORAL
  Filled 2013-10-15: qty 1

## 2013-10-15 MED ORDER — CALCIUM CARBONATE ANTACID 500 MG PO CHEW: 2.0000 | CHEWABLE_TABLET | ORAL | Status: DC | PRN

## 2013-10-15 MED ORDER — LACTATED RINGERS IV SOLN
INTRAVENOUS | Status: DC
  Administered 2013-10-15 – 2013-10-16 (×3): via INTRAVENOUS

## 2013-10-15 MED ORDER — ZOLPIDEM TARTRATE 5 MG PO TABS: 5.0000 mg | ORAL_TABLET | Freq: Every evening | ORAL | Status: DC | PRN

## 2013-10-15 MED ORDER — ACETAMINOPHEN 325 MG PO TABS
650.0000 mg | ORAL_TABLET | ORAL | Status: DC | PRN
  Administered 2013-10-15: 650 mg via ORAL
  Filled 2013-10-15: qty 2

## 2013-10-15 NOTE — Progress Notes (Signed)
Patient ID: Melanie [name], female   DOB: [birth year], 28 y.o.   MRN: [id number]   Patient Name: Melanie [name] Date of Encounter: 10/15/2013  Primary Care Provider:  Philis Fendt, MD Primary Cardiologist:  Dorothy Spark  Problem List   Past Medical History  Diagnosis Date  . Anemia   . Gestational diabetes mellitus in pregnancy   . Cardiac tumor   . Crohn's disease   . History of genital warts    Past Surgical History  Procedure Laterality Date  . Cardiac surgery      at 28 years old    Allergies  No Known Allergies  HPI  28 year old female currently [redacted] weeks pregnant who is coming for evaluation of chest pain. Her chest pains started approximately 2 months ago and is associated with taking deep breath, and certain positions like laying on the left side. Her pain has improved in the last months and currently she only feels it occasionally. Her pain is not associated with shortness of breath or palpitation. 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 Patient states that it's a hair history in her family, her mother, maternal grandfather and great-grandfather had seen surgery in the past. The surgery was performed in June of in 2004. Patient states that she has been followed with cardiology since then. Disease in her pregnancy #4 she has 3 healthy children at home, age 34,5, and 2. She hasn't had these problems in her previous pregnancies. She denies exertional chest pain, shortness of breath or palpitations. She denies lower extremity edema orthopnea or paroxysmal nocturnal dyspnea.  At 1 month follow up the patient says that her chest pain has resolved. She has occasional skipped beats, but no dizziness or syncope. She denies any significant shortness of breath.  Home Medications  Prior to Admission medications   Medication Sig Start Date End Date Taking? Authorizing Provider  anti-nausea (EMETROL)  solution Take 10 mLs by mouth every 15 (fifteen) minutes as needed for nausea or vomiting.   Yes Historical Provider, MD  ondansetron (ZOFRAN) 4 MG tablet Take 1 tablet (4 mg total) by mouth every 6 (six) hours. 08/15/13  Yes Houston Siren III, MD  Prenatal Vit-Fe Fumarate-FA (MULTIVITAMIN-PRENATAL) 27-0.8 MG TABS tablet Take 1 tablet by mouth daily at 12 noon.   Yes Historical Provider, MD  simethicone (MYLICON) 283 MG chewable tablet Chew 125 mg by mouth every 6 (six) hours as needed for flatulence.   Yes Historical Provider, MD    Family History  Family History  Problem Relation Age of Onset  . Hypertension Mother   . Heart Problems Mother     tumor, open heart surgery  . Heart Problems Maternal Aunt     tumor  . Diabetes Father   . Diabetes Paternal Grandmother   . Diabetes Maternal Grandmother     Social History  History   Social History  . Marital Status: Single    Spouse Name: N/A    Number of Children: 3  . Years of Education: N/A   Occupational History  . Not on file.   Social History Main Topics  . Smoking status: Current Some Day Smoker  . Smokeless tobacco: Never Used  . Alcohol Use: Yes     Comment: occasionally  . Drug Use: 1.00 per week    Special: Marijuana     Comment: states she smoked 2 weeks ago when she was overly stressed, none  since then.  . Sexual Activity: Yes    Birth Control/ Protection: None   Other Topics Concern  . Not on file   Social History Narrative  . No narrative on file     Review of Systems, as per HPI, otherwise negative General:  No chills, fever, night sweats or weight changes.  Cardiovascular:  No chest pain, dyspnea on exertion, edema, orthopnea, palpitations, paroxysmal nocturnal dyspnea. Dermatological: No rash, lesions/masses Respiratory: No cough, dyspnea Urologic: No hematuria, dysuria Abdominal:   No nausea, vomiting, diarrhea, bright red blood per rectum, melena, or hematemesis Neurologic:  No visual  changes, wkns, changes in mental status. All other systems reviewed and are otherwise negative except as noted above.  Physical Exam  Blood pressure 108/61, pulse 107, height 5\' 5"  (1.651 m), weight 167 lb 12.8 oz (76.114 kg).  General: Pleasant, NAD Psych: Normal affect. Neuro: Alert and oriented X 3. Moves all extremities spontaneously. HEENT: Normal  Neck: Supple without bruits or JVD. Lungs:  Resp regular and unlabored, CTA. Heart: RRR no s3, s4, 2/6 systolic murmur, no rub. Abdomen: Soft, non-tender, non-distended, BS + x 4.  Extremities: No clubbing, cyanosis or edema. DP/PT/Radials 2+ and equal bilaterally.  Labs:  No results found for this basename: CKTOTAL, CKMB, TROPONINI,  in the last 72 hours Lab Results  Component Value Date   WBC 11.0* 09/23/2013   HGB 13.8 09/23/2013   HCT 40.4 09/23/2013   MCV 94.0 09/23/2013   PLT 258 09/23/2013   Accessory Clinical Findings  Echocardiogram - 06/15/2005 - Left ventricular size was normal. - Overall left ventricular systolic function was normal. - Left ventricular ejection fraction was estimated , range being 55 % to 65 %.. - There were no left ventricular regional wall motion abnormalities. - Left ventricular wall thickness was normal. AORTIC VALVE: - The aortic valve was trileaflet. - Aortic valve thickness was normal. - There was normal aortic valve leaflet excursion. Doppler interpretation(s): - Transaortic velocity was within the normal range. - There was no evidence for aortic valve stenosis. - There was no significant aortic valvular regurgitation. AORTA: - The aortic root was normal in size. MITRAL VALVE: - There was mild thickening of the mitral valve involving the anterior leaflet,chord, and papilary muscle. - There was normal mitral valve leaflet excursion. Doppler interpretation(s): - The transmitral velocity was within the normal range. - There was no evidence for mitral stenosis. - There was no significant  mitral valvular regurgitation. LEFT ATRIUM: - Left atrial size was normal. PULMONARY VEINS: - The pulmonary veins were grossly normal. RIGHT VENTRICLE: - Right ventricular size was normal. - Right ventricular systolic function was normal. - Right ventricular wall thickness was normal. PULMONIC VALVE: - The structure of the pulmonic valve appeared to be normal. Doppler interpretation(s): - The transpulmonic velocity was within the normal range. - There was no pulmonic valve stenosis. - There was no significant pulmonic regurgitation. TRICUSPID VALVE: - The tricuspid valve structure was normal. - Tricuspid leaflet excursion was normal. Doppler interpretation(s): - The transtricuspid velocity was within the normal range. - There was no evidence for tricuspid stenosis. - There was no significant tricuspid valvular regurgitation. PULMONARY ARTERY: - The pulmonary artery was grossly normal. RIGHT ATRIUM: - Right atrial size was normal. SYSTEMIC VEINS: - The systemic veins were grossly normal. - The inferior vena cava was normal. PERICARDIUM: - There was no pericardial effusion. - The pericardium was normal in appearance.  Echo: 10/01/2013 Left ventricle: The cavity size was normal. Systolic function  was vigorous. The estimated ejection fraction was in the range of 65% to 70%. Wall motion was normal; there were no regional wall motion abnormalities. Left ventricular diastolic function parameters were normal. - Left atrium: There is possible mass at the lateral wall of the left atrium. Further evaluation with cardiac MRI is recommended. The atrium was mildly dilated. - Right ventricle: The cavity size was mildly dilated. Wall thickness was normal. Systolic function was mildly reduced. - Atrial septum: A septal defect cannot be excluded. - Tricuspid valve: Moderate-severe regurgitation. - Pulmonary arteries: PA peak pressure: 57mm Hg (S).  ECG - sinus rhythm, diffuse ST  elevations suspicious for pericarditis   Assessment & Plan  A 28 year old female, [redacted] week pregnant with   1. 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 The cardiac myxomas generally are diagnosed at an earlier age than sporadic myxomas and have a higher tendency to recur . We will [facility] to obtain [facility]. The echocardiogram showed a suspicious structure at the lateral wall of the left atrium. When compared to the echo from 2006 the structure was present and most probably represents postsurgical changes. Tere was another structure in the proximity to the aortic valve, not previously present that might represent an artifact.  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   There is also moderate to severe tricuspid regurgitation that was previously just mild. Her right-sided pressures are normal. The major hemodynamic burden on her heart on during pregnancy second trimester. We will follow the patient in 2 months and if she is asymptomatic we will see her after pregnancy.  2. Pericarditis - with improving chest pain - no preceding viral illness. We will order echocardiogram, her symptoms are improving, we won't treat during her pregnancy, but if her symptoms recur we will consider NSAIDS in the second trimester.  3. Systolic murmur - moderate to severe TR,  normal RVSP  Follow up in 2 months.  Dorothy Spark, MD, [facility] 10/15/2013, 10:33 AM

## 2013-10-15 NOTE — H&P (Signed)
Melanie [name] is a 28 y.o. female presenting for worsening vaginal bleeding. Patient was seen and evaluated several times in the past week For episodes of bright red gushes of blood at home.  She is 17 weeks 2 days by LMP consistent with early [place].   A large retroperitoneal hematoma was seen on ultrasound yesterday at MAU measuring approximately 6.5cm.  Today, the patient presented to office with another episode of gush of bright red blood. She is also noting lower pelvic cramping.   Ultrasound demonstrates an expanding retroperitoneal hematoma now measuring 9.5cm, she also has marginal previa. There was a noted viable fetus with FHR 145-155.   History OB History   Grav Para Term Preterm Abortions TAB SAB Ect Mult Living   4 3 2 1      3      Past Medical History  Diagnosis Date  . Anemia   . Gestational diabetes mellitus in pregnancy   . Cardiac tumor   . Crohn's disease   . History of genital warts    Past Surgical History  Procedure Laterality Date  . Cardiac surgery      at 28 years old   Family History: family history includes Diabetes in her father, maternal grandmother, and paternal grandmother; Heart Problems in her maternal aunt and mother; Hypertension in her mother. Social History:  reports that she has been smoking.  She has never used smokeless tobacco. She reports that she drinks alcohol. She reports that she uses illicit drugs (Marijuana) about once per week.   Prenatal Transfer Tool  Maternal Diabetes: No Genetic Screening: Normal Maternal Ultrasounds/Referrals: Normal Fetal Ultrasounds or other Referrals:  None Maternal Substance Abuse:  No Significant Maternal Medications:  None Significant Maternal Lab Results:  None Other Comments:  None  ROS as per HPI    Blood pressure 127/53, pulse 84, temperature 98.1 F (36.7 C), temperature source Oral, resp. rate 16, height 5\' 5"  (1.651 m), weight 78.019 kg (172 lb), SpO2 97.00%. Exam Physical Exam   Constitutional: She is oriented to person, place, and time. She appears well-developed and well-nourished.  HENT:  Head: Normocephalic and atraumatic.  Eyes: Pupils are equal, round, and reactive to light.  Neck: Normal range of motion.  Cardiovascular: Normal rate and regular rhythm.   Respiratory: Effort normal.  GI: Soft.  Genitourinary:  Blood noted along b/l labia.  SSE: Blood in the vault, dark in color no active bright red bleeding from os. Cervix visibly appears closed.   Musculoskeletal: Normal range of motion.  Neurological: She is alert and oriented to person, place, and time.    Prenatal labs: ABO, Rh: --/--/O POS (04/06 1730) Antibody: NEG (04/06 1730) Rubella:  Immune RPR:   NR HBsAg:   Neg HIV:   NR GBS:   Unk  Assessment/Plan: 28 yo P4 at 17 weeks with apparent abruptio placentae as well as marginal previa Patient admitted to [facility] unit for close observation of bleeding Type and screen q72 hrs CBC Two peripheral IV's Strict bedrest, SCDs while in bed for DVT prophylaxis Patient, mother and spouse counseled for more than 35 minutes about clinical picture. She was made aware that if she begins to hemorrhage and becomes Hemodynamically unstable, the pregnancy may have to be sacrificed by D&E as a life saving measure.  She is also aware that if the hematoma continues to  Expand, the viability of the pregnancy progressing to a gestational age that is viable is low.  Will closely monitor for bleeding.  Melanie [name], Melanie [name] 10/15/2013, 10:40 PM

## 2013-10-15 NOTE — Patient Instructions (Signed)
Your physician recommends that you schedule a follow-up appointment in: 2 months with Dr. Meda Coffee.

## 2013-10-16 LAB — CBC
HCT: 36.7 % (ref 36.0–46.0)
HEMATOCRIT: 35.6 % — AB (ref 36.0–46.0)
Hemoglobin: 12 g/dL (ref 12.0–15.0)
Hemoglobin: 11.8 g/dL — ABNORMAL LOW (ref 12.0–15.0)
MCH: 31.7 pg (ref 26.0–34.0)
MCH: 31.7 pg (ref 26.0–34.0)
MCHC: 32.7 g/dL (ref 30.0–36.0)
MCHC: 33.1 g/dL (ref 30.0–36.0)
MCV: 97.1 fL (ref 78.0–100.0)
MCV: 95.7 fL (ref 78.0–100.0)
PLATELETS: 272 10*3/uL (ref 150–400)
Platelets: 244 10*3/uL (ref 150–400)
RBC: 3.78 MIL/uL — AB (ref 3.87–5.11)
RBC: 3.72 MIL/uL — AB (ref 3.87–5.11)
RDW: 14.7 % (ref 11.5–15.5)
RDW: 14.4 % (ref 11.5–15.5)
WBC: 13.1 10*3/uL — AB (ref 4.0–10.5)
WBC: 11.5 10*3/uL — ABNORMAL HIGH (ref 4.0–10.5)

## 2013-10-16 LAB — FIBRINOGEN: Fibrinogen: 307 mg/dL (ref 204–475)

## 2013-10-16 NOTE — Progress Notes (Signed)
HD#2 [redacted]w[redacted]d possible subchorionic hemorrhage Pt feeling comfortable, denies further bleeding.  No other complaints. [place] 4/7 6.5cm possible sub chor hem with marginal previa, MFM made recommendations - see report Requested consult from MFm Bentley. Continue other care.

## 2013-10-17 ENCOUNTER — Inpatient Hospital Stay (HOSPITAL_COMMUNITY): Payer: Medicaid Other

## 2013-10-17 LAB — CBC
HCT: 38.3 % (ref 36.0–46.0)
Hemoglobin: 12.5 g/dL (ref 12.0–15.0)
MCH: 31.4 pg (ref 26.0–34.0)
MCHC: 32.6 g/dL (ref 30.0–36.0)
MCV: 96.2 fL (ref 78.0–100.0)
Platelets: 272 10*3/uL (ref 150–400)
RBC: 3.98 MIL/uL (ref 3.87–5.11)
RDW: 14.5 % (ref 11.5–15.5)
WBC: 12 10*3/uL — ABNORMAL HIGH (ref 4.0–10.5)

## 2013-10-17 LAB — FIBRINOGEN: Fibrinogen: 339 mg/dL (ref 204–475)

## 2013-10-17 NOTE — Progress Notes (Signed)
Pt is discharged in the care of Mother. Denies any pain or vaginal bleeding. Discharge instructions with Rx were given to pt. Questions were asked and answered. No equipment needed for home use.. Aware of ultrasound being done on October 19, 728 a.m.

## 2013-10-17 NOTE — Consult Note (Signed)
Maternal Fetal Medicine Consultation  Requesting Provider(s): Freda Munro, MD  Reason for consultation: Large subchorionic hemorrhage, recommendations for management  HPI: Melanie [name] is a 28 yo [id number] currently at [redacted]w[redacted]d who was admitted due to vaginal bleeding associated with a large subchorionic hemorrhage.  The patient reports that on the day of admission, she was experiencing heavy bright red bleeding that has since resolved - over the last three days, has only noticed dark blood / spotting without passage of clots.  Ultrasound performed on the day of admission showed "a 6.5 x 4.0 x 3.8 heterogeneous area at right inferolateral margin of the placenta" as well as a "3.7 x 1.3 x 3.0 cm heterogeneous area at the fundal margin of the placenta.  Additionally, the placenta appears to abut the internal os on transvaginal ultrasound (low lying placenta vs. Marginal previa).  At the time of admission, there was concerns that she might require a D&E if she were to become hemodynamically unstable - her most recent Hct at 38% and she is otherwise without complaints.  She denies cramping / contractions.   OB History: OB History   Grav Para Term Preterm Abortions TAB SAB Ect Mult Living   4 3 2 1      3       PMH:  Past Medical History  Diagnosis Date  . Anemia   . Gestational diabetes mellitus in pregnancy   . Cardiac tumor   . Crohn's disease   . History of genital warts     PSH:  Past Surgical History  Procedure Laterality Date  . Cardiac surgery      at 28 years old   Meds:  No current facility-administered medications on file prior to encounter.   Current Outpatient Prescriptions on File Prior to Encounter  Medication Sig Dispense Refill  . acetaminophen (TYLENOL) 500 MG tablet Take 500 mg by mouth every 4 (four) hours as needed for moderate pain.      . Prenatal Vit-Fe Fumarate-FA (PRENATAL MULTIVITAMIN) TABS tablet Take 1 tablet by mouth daily at 12 noon.       Allergies:  No Known Allergies  FH:  Family History  Problem Relation Age of Onset  . Hypertension Mother   . Heart Problems Mother     tumor, open heart surgery  . Heart Problems Maternal Aunt     tumor  . Diabetes Father   . Diabetes Paternal Grandmother   . Diabetes Maternal Grandmother    Soc:  History   Social History  . Marital Status: Single    Spouse Name: N/A    Number of Children: 3  . Years of Education: N/A   Occupational History  . Not on file.   Social History Main Topics  . Smoking status: Current Some Day Smoker  . Smokeless tobacco: Never Used  . Alcohol Use: Yes     Comment: occasionally  . Drug Use: 1.00 per week    Special: Marijuana     Comment: states she smoked 2 weeks ago when she was overly stressed, none since then.  . Sexual Activity: Yes    Birth Control/ Protection: None   Other Topics Concern  . Not on file   Social History Narrative  . No narrative on file    Review of Systems: no vaginal bleeding or cramping/contractions, no LOF, no nausea/vomiting. All other systems reviewed and are negative.  PE:   Filed Vitals:   10/17/13 1157  BP: 123/61  Pulse: 76  Temp:  98.4 F (36.9 C)  Resp: 18    GEN: well-appearing female ABD: gravid, NT  Please see separate document for fetal ultrasound report.  Labs: CBC    Component Value Date/Time   WBC 12.0* 10/17/2013 0525   RBC 3.98 10/17/2013 0525   HGB 12.5 10/17/2013 0525   HCT 38.3 10/17/2013 0525   PLT 272 10/17/2013 0525   MCV 96.2 10/17/2013 0525   MCH 31.4 10/17/2013 0525   MCHC 32.6 10/17/2013 0525   RDW 14.5 10/17/2013 0525   LYMPHSABS 2.2 08/15/2013 0752   MONOABS 1.0 08/15/2013 0752   EOSABS 0.2 08/15/2013 0752   BASOSABS 0.0 08/15/2013 0752     A/P: 1) Single IUP at [redacted]w[redacted]d         2) Large subchorionic hemorrhage - no additional bleeding since admission.  Hb stable.  Recommendations: 1) At this current gestation, there is little to offer for fetal behalf.  The patient is currently stable and  feel that she may be discharged with precautions.  Would limit activity at home (no prolonged standing / heavy lifting or activity) 2) Would give Iron supplements if bleeding continues 3) Patient is tentatively scheduled [facility] ([facility] April) to reevaluate. 4) If bleeding continues, would consider admission once viability is reached for course of betamethasone and observation / NICU consult.   Thank you for the opportunity to be a part of the care of Melanie [name]. Please contact our office if we can be of further assistance.   I spent approximately 30 minutes with this patient with over 50% of time spent in face-to-face counseling.  Benjaman Lobe, MD Maternal Fetal Medicine

## 2013-10-17 NOTE — Progress Notes (Signed)
Pt has had no more heavy bleeding since admission. She continues to have dark bloody discharge. She is aware that there is no treatment for her condition and that the baby cannot survive if delivered at this stage. She has also been seen by MFM . Will discharge to home and return to MFM on Friday for followup u/s. Will maintain limited activity at home.

## 2013-10-18 ENCOUNTER — Other Ambulatory Visit (HOSPITAL_COMMUNITY): Payer: Self-pay | Admitting: Maternal and Fetal Medicine

## 2013-10-18 DIAGNOSIS — O4420 Partial placenta previa NOS or without hemorrhage, unspecified trimester: Secondary | ICD-10-CM

## 2013-10-18 DIAGNOSIS — O468X9 Other antepartum hemorrhage, unspecified trimester: Secondary | ICD-10-CM

## 2013-10-18 DIAGNOSIS — O418X90 Other specified disorders of amniotic fluid and membranes, unspecified trimester, not applicable or unspecified: Secondary | ICD-10-CM

## 2013-10-19 ENCOUNTER — Ambulatory Visit (HOSPITAL_COMMUNITY)
Admit: 2013-10-19 | Discharge: 2013-10-19 | Disposition: A | Discharge status: Home / Self Care | Payer: Medicaid Other | Attending: Internal Medicine | Admitting: Internal Medicine

## 2013-10-19 VITALS — BP 114/77 | HR 110 | Wt 165.0 lb

## 2013-10-19 DIAGNOSIS — O09299 Supervision of pregnancy with other poor reproductive or obstetric history, unspecified trimester: Secondary | ICD-10-CM | POA: Insufficient documentation

## 2013-10-19 DIAGNOSIS — O4420 Partial placenta previa NOS or without hemorrhage, unspecified trimester: Secondary | ICD-10-CM

## 2013-10-19 DIAGNOSIS — O99330 Smoking (tobacco) complicating pregnancy, unspecified trimester: Secondary | ICD-10-CM | POA: Insufficient documentation

## 2013-10-19 DIAGNOSIS — Z8751 Personal history of pre-term labor: Secondary | ICD-10-CM | POA: Insufficient documentation

## 2013-10-19 DIAGNOSIS — O352XX0 Maternal care for (suspected) hereditary disease in fetus, not applicable or unspecified: Secondary | ICD-10-CM | POA: Insufficient documentation

## 2013-10-19 DIAGNOSIS — O468X9 Other antepartum hemorrhage, unspecified trimester: Secondary | ICD-10-CM

## 2013-10-19 DIAGNOSIS — O418X90 Other specified disorders of amniotic fluid and membranes, unspecified trimester, not applicable or unspecified: Secondary | ICD-10-CM

## 2013-10-22 NOTE — Discharge Summary (Signed)
Pt is a 28 y/o black female who was admitted for vaginal bleeding at 17 weeks. At the time she had been to the ER 3 times in the past 48 hours. She was found to have a large placental hematoma. Her bleeding decreased and she was evaluated by MFM. Given that her fetus was not viable and that the bleeding deminished she was discharged to home on bedrest.

## 2013-11-01 ENCOUNTER — Ambulatory Visit: Payer: Medicaid Other | Admitting: Cardiology

## 2013-11-16 ENCOUNTER — Ambulatory Visit (HOSPITAL_COMMUNITY): Payer: Medicaid Other | Attending: Internal Medicine

## 2013-12-10 ENCOUNTER — Ambulatory Visit: Payer: Medicaid Other | Admitting: Cardiology

## 2014-01-16 ENCOUNTER — Encounter: Payer: Medicaid Other | Attending: Obstetrics and Gynecology

## 2014-01-16 VITALS — Ht 65.0 in | Wt 175.4 lb

## 2014-01-16 DIAGNOSIS — O99810 Abnormal glucose complicating pregnancy: Secondary | ICD-10-CM | POA: Diagnosis not present

## 2014-01-16 DIAGNOSIS — Z713 Dietary counseling and surveillance: Secondary | ICD-10-CM | POA: Diagnosis not present

## 2014-01-16 DIAGNOSIS — O24419 Gestational diabetes mellitus in pregnancy, unspecified control: Secondary | ICD-10-CM

## 2014-01-16 NOTE — Progress Notes (Signed)
  Patient was seen on 01/16/14 for Gestational Diabetes self-management class at [facility]. The following learning objectives were met by the patient during this course:   States the definition of Gestational Diabetes  States why dietary management is important in controlling blood glucose  Describes the effects of carbohydrates on blood glucose levels  Demonstrates ability to create a balanced meal plan  Demonstrates carbohydrate counting   States when to check blood glucose levels  Demonstrates proper blood glucose monitoring techniques  States the effect of stress and exercise on blood glucose levels  States the importance of limiting caffeine and abstaining from alcohol and smoking  Plan:  Aim for 2 Carb Choices per meal (30 grams) +/- 1 either way for breakfast Aim for 3 Carb Choices per meal (45 grams) +/- 1 either way from lunch and dinner Aim for 1-2 Carbs per snack Begin reading food labels for Total Carbohydrate and sugar grams of foods Consider  increasing your activity level by walking daily as tolerated Begin checking BG before breakfast and 2 hours after first bit of breakfast, lunch and dinner after  as directed by MD  Take medication  as directed by MD  Blood glucose monitor given: Accu Chek Nano BG Monitoring Kit Lot # [id number]  Exp: 01/09/15 Blood glucose reading: 219 4hpp Phone office of Dr. Philis Pique. Spoke with [employer]. Advised of 1h glucose test result 304, hx of insulin dept GDM in past, present 4hpp of 219. Patient has no scheduled f/u appointment. Appointment scheduled with Dr. Ouida Sills for 2:45 tomorrow 01/17/14. Candie advised to eat and monitor and directed.  Patient instructed to monitor glucose levels: FBS: 60 - <90 2 hour: <120  Patient received the following handouts:  Nutrition Diabetes and Pregnancy  Carbohydrate Counting List  Meal Planning worksheet  Patient will be seen for follow-up as needed.

## 2014-01-30 ENCOUNTER — Inpatient Hospital Stay (HOSPITAL_COMMUNITY): Payer: Medicaid Other

## 2014-01-30 ENCOUNTER — Inpatient Hospital Stay (HOSPITAL_COMMUNITY)
Admission: AD | Admit: 2014-01-30 | Discharge: 2014-01-30 | Disposition: A | Discharge status: Home / Self Care | Payer: Medicaid Other | Source: Ambulatory Visit | Attending: Obstetrics and Gynecology | Admitting: Obstetrics and Gynecology

## 2014-01-30 ENCOUNTER — Encounter (HOSPITAL_COMMUNITY): Payer: Self-pay | Admitting: *Deleted

## 2014-01-30 ENCOUNTER — Inpatient Hospital Stay (HOSPITAL_COMMUNITY)
Admission: AD | Admit: 2014-01-30 | Discharge: 2014-02-07 | DRG: 781 | Disposition: A | Discharge status: Home / Self Care | Payer: Medicaid Other | Source: Ambulatory Visit | Attending: Obstetrics & Gynecology | Admitting: Obstetrics & Gynecology

## 2014-01-30 DIAGNOSIS — Z794 Long term (current) use of insulin: Secondary | ICD-10-CM | POA: Diagnosis not present

## 2014-01-30 DIAGNOSIS — Z833 Family history of diabetes mellitus: Secondary | ICD-10-CM

## 2014-01-30 DIAGNOSIS — Z8249 Family history of ischemic heart disease and other diseases of the circulatory system: Secondary | ICD-10-CM | POA: Diagnosis not present

## 2014-01-30 DIAGNOSIS — O4692 Antepartum hemorrhage, unspecified, second trimester: Secondary | ICD-10-CM

## 2014-01-30 DIAGNOSIS — Z22330 Carrier of Group B streptococcus: Secondary | ICD-10-CM

## 2014-01-30 DIAGNOSIS — F1210 Cannabis abuse, uncomplicated: Secondary | ICD-10-CM | POA: Diagnosis present

## 2014-01-30 DIAGNOSIS — K5090 Crohn's disease, unspecified, without complications: Secondary | ICD-10-CM | POA: Diagnosis present

## 2014-01-30 DIAGNOSIS — D649 Anemia, unspecified: Secondary | ICD-10-CM | POA: Diagnosis present

## 2014-01-30 DIAGNOSIS — O368390 Maternal care for abnormalities of the fetal heart rate or rhythm, unspecified trimester, not applicable or unspecified: Secondary | ICD-10-CM | POA: Diagnosis not present

## 2014-01-30 DIAGNOSIS — O99891 Other specified diseases and conditions complicating pregnancy: Secondary | ICD-10-CM | POA: Diagnosis present

## 2014-01-30 DIAGNOSIS — L299 Pruritus, unspecified: Secondary | ICD-10-CM | POA: Diagnosis present

## 2014-01-30 DIAGNOSIS — O99019 Anemia complicating pregnancy, unspecified trimester: Secondary | ICD-10-CM | POA: Diagnosis present

## 2014-01-30 DIAGNOSIS — O4700 False labor before 37 completed weeks of gestation, unspecified trimester: Secondary | ICD-10-CM | POA: Diagnosis present

## 2014-01-30 DIAGNOSIS — B3731 Acute candidiasis of vulva and vagina: Secondary | ICD-10-CM | POA: Diagnosis present

## 2014-01-30 DIAGNOSIS — B373 Candidiasis of vulva and vagina: Secondary | ICD-10-CM | POA: Diagnosis present

## 2014-01-30 DIAGNOSIS — O4690 Antepartum hemorrhage, unspecified, unspecified trimester: Secondary | ICD-10-CM | POA: Insufficient documentation

## 2014-01-30 DIAGNOSIS — O24419 Gestational diabetes mellitus in pregnancy, unspecified control: Secondary | ICD-10-CM

## 2014-01-30 DIAGNOSIS — O99810 Abnormal glucose complicating pregnancy: Principal | ICD-10-CM | POA: Diagnosis present

## 2014-01-30 DIAGNOSIS — O9989 Other specified diseases and conditions complicating pregnancy, childbirth and the puerperium: Secondary | ICD-10-CM | POA: Diagnosis present

## 2014-01-30 DIAGNOSIS — O2390 Unspecified genitourinary tract infection in pregnancy, unspecified trimester: Secondary | ICD-10-CM | POA: Diagnosis present

## 2014-01-30 DIAGNOSIS — O4590 Premature separation of placenta, unspecified, unspecified trimester: Secondary | ICD-10-CM | POA: Diagnosis present

## 2014-01-30 DIAGNOSIS — O99340 Other mental disorders complicating pregnancy, unspecified trimester: Secondary | ICD-10-CM | POA: Diagnosis present

## 2014-01-30 LAB — CBC
HEMATOCRIT: 35.5 % — AB (ref 36.0–46.0)
HEMOGLOBIN: 12.1 g/dL (ref 12.0–15.0)
MCH: 31.3 pg (ref 26.0–34.0)
MCHC: 34.1 g/dL (ref 30.0–36.0)
MCV: 91.7 fL (ref 78.0–100.0)
Platelets: 221 10*3/uL (ref 150–400)
RBC: 3.87 MIL/uL (ref 3.87–5.11)
RDW: 13.6 % (ref 11.5–15.5)
WBC: 8 10*3/uL (ref 4.0–10.5)

## 2014-01-30 LAB — URINALYSIS, ROUTINE W REFLEX MICROSCOPIC
Bilirubin Urine: NEGATIVE
Glucose, UA: 250 mg/dL — AB
Ketones, ur: NEGATIVE mg/dL
NITRITE: NEGATIVE
Protein, ur: NEGATIVE mg/dL
SPECIFIC GRAVITY, URINE: 1.015 (ref 1.005–1.030)
UROBILINOGEN UA: 0.2 mg/dL (ref 0.0–1.0)
pH: 5.5 (ref 5.0–8.0)

## 2014-01-30 LAB — GLUCOSE, CAPILLARY
GLUCOSE-CAPILLARY: 103 mg/dL — AB (ref 70–99)
GLUCOSE-CAPILLARY: 148 mg/dL — AB (ref 70–99)
GLUCOSE-CAPILLARY: 178 mg/dL — AB (ref 70–99)
Glucose-Capillary: 138 mg/dL — ABNORMAL HIGH (ref 70–99)
Glucose-Capillary: 171 mg/dL — ABNORMAL HIGH (ref 70–99)
Glucose-Capillary: 185 mg/dL — ABNORMAL HIGH (ref 70–99)
Glucose-Capillary: 185 mg/dL — ABNORMAL HIGH (ref 70–99)

## 2014-01-30 LAB — URINE MICROSCOPIC-ADD ON

## 2014-01-30 MED ORDER — CLOTRIMAZOLE 2 % VA CREA
1.0000 | TOPICAL_CREAM | Freq: Every day | VAGINAL | Status: AC
  Administered 2014-01-30 – 2014-02-01 (×3): 1 via VAGINAL
  Filled 2014-01-30: qty 22.2

## 2014-01-30 MED ORDER — INSULIN NPH (HUMAN) (ISOPHANE) 100 UNIT/ML ~~LOC~~ SUSP
45.0000 [IU] | Freq: Every day | SUBCUTANEOUS | Status: DC
  Filled 2014-01-30: qty 10

## 2014-01-30 MED ORDER — NIFEDIPINE 10 MG PO CAPS: 10.0000 mg | ORAL_CAPSULE | ORAL | Status: DC | PRN

## 2014-01-30 MED ORDER — CALCIUM CARBONATE ANTACID 500 MG PO CHEW
2.0000 | CHEWABLE_TABLET | ORAL | Status: DC | PRN
  Administered 2014-01-30: 200 mg via ORAL
  Administered 2014-01-30 – 2014-02-04 (×9): 400 mg via ORAL
  Filled 2014-01-30 (×15): qty 1

## 2014-01-30 MED ORDER — INSULIN REGULAR HUMAN 100 UNIT/ML IJ SOLN
22.0000 [IU] | Freq: Every day | INTRAMUSCULAR | Status: DC
  Filled 2014-01-30: qty 0.22

## 2014-01-30 MED ORDER — NIFEDIPINE 10 MG PO CAPS
20.0000 mg | ORAL_CAPSULE | Freq: Once | ORAL | Status: AC
  Administered 2014-01-30: 20 mg via ORAL
  Filled 2014-01-30: qty 2

## 2014-01-30 MED ORDER — SODIUM CHLORIDE 0.9 % IJ SOLN: 3.0000 mL | INTRAMUSCULAR | Status: DC | PRN

## 2014-01-30 MED ORDER — ZOLPIDEM TARTRATE 5 MG PO TABS: 5.0000 mg | ORAL_TABLET | Freq: Every evening | ORAL | Status: DC | PRN

## 2014-01-30 MED ORDER — ACETAMINOPHEN 325 MG PO TABS: 650.0000 mg | ORAL_TABLET | ORAL | Status: DC | PRN

## 2014-01-30 MED ORDER — PRENATAL MULTIVITAMIN CH
1.0000 | ORAL_TABLET | Freq: Every day | ORAL | Status: DC
  Administered 2014-01-30 – 2014-02-06 (×7): 1 via ORAL
  Filled 2014-01-30 (×6): qty 1

## 2014-01-30 MED ORDER — SODIUM CHLORIDE 0.9 % IJ SOLN
3.0000 mL | Freq: Two times a day (BID) | INTRAMUSCULAR | Status: DC
  Administered 2014-01-30 – 2014-02-02 (×3): 3 mL via INTRAVENOUS

## 2014-01-30 MED ORDER — DEXTROSE IN LACTATED RINGERS 5 % IV SOLN
INTRAVENOUS | Status: DC
  Administered 2014-01-31 – 2014-02-01 (×6): via INTRAVENOUS

## 2014-01-30 MED ORDER — SODIUM CHLORIDE 0.9 % IV SOLN
INTRAVENOUS | Status: DC
  Administered 2014-01-30: 19:00:00 via INTRAVENOUS
  Filled 2014-01-30: qty 1

## 2014-01-30 MED ORDER — SODIUM CHLORIDE 0.9 % IV SOLN: 250.0000 mL | INTRAVENOUS | Status: DC | PRN

## 2014-01-30 MED ORDER — DEXTROSE IN LACTATED RINGERS 5 % IV SOLN
INTRAVENOUS | Status: DC
  Administered 2014-01-30: 19:00:00 via INTRAVENOUS

## 2014-01-30 MED ORDER — ONDANSETRON HCL 4 MG PO TABS
4.0000 mg | ORAL_TABLET | Freq: Three times a day (TID) | ORAL | Status: DC | PRN
  Administered 2014-01-30: 4 mg via ORAL
  Filled 2014-01-30 (×2): qty 1

## 2014-01-30 MED ORDER — ONDANSETRON HCL 4 MG/5ML PO SOLN
4.0000 mg | Freq: Three times a day (TID) | ORAL | Status: DC | PRN
  Filled 2014-01-30: qty 5

## 2014-01-30 MED ORDER — BETAMETHASONE SOD PHOS & ACET 6 (3-3) MG/ML IJ SUSP
12.0000 mg | INTRAMUSCULAR | Status: AC
  Administered 2014-01-30 – 2014-01-31 (×2): 12 mg via INTRAMUSCULAR
  Filled 2014-01-30 (×2): qty 2

## 2014-01-30 MED ORDER — INSULIN NPH (HUMAN) (ISOPHANE) 100 UNIT/ML ~~LOC~~ SUSP: 16.0000 [IU] | Freq: Every day | SUBCUTANEOUS | Status: DC

## 2014-01-30 MED ORDER — DOCUSATE SODIUM 100 MG PO CAPS
100.0000 mg | ORAL_CAPSULE | Freq: Every day | ORAL | Status: DC
  Administered 2014-01-30 – 2014-02-07 (×8): 100 mg via ORAL
  Filled 2014-01-30 (×7): qty 1

## 2014-01-30 MED ORDER — INSULIN REGULAR HUMAN 100 UNIT/ML IJ SOLN: 16.0000 [IU] | Freq: Every day | INTRAMUSCULAR | Status: DC

## 2014-01-30 MED ORDER — FAMOTIDINE 20 MG PO TABS
20.0000 mg | ORAL_TABLET | Freq: Two times a day (BID) | ORAL | Status: DC
Start: 2014-01-30 — End: 2014-02-07
  Administered 2014-01-30 – 2014-02-07 (×15): 20 mg via ORAL
  Filled 2014-01-30 (×15): qty 1

## 2014-01-30 MED ORDER — FLUCONAZOLE 150 MG PO TABS
150.0000 mg | ORAL_TABLET | Freq: Once | ORAL | Status: AC
  Administered 2014-01-30: 150 mg via ORAL
  Filled 2014-01-30: qty 1

## 2014-01-30 MED ORDER — SODIUM CHLORIDE 0.9 % IV SOLN
INTRAVENOUS | Status: DC
  Administered 2014-01-31: 9.5 [IU]/h via INTRAVENOUS
  Administered 2014-01-31: 9.6 [IU]/h via INTRAVENOUS
  Administered 2014-01-31: 13.8 [IU]/h via INTRAVENOUS
  Administered 2014-01-31: 5.5 [IU]/h via INTRAVENOUS
  Administered 2014-02-01: 3.8 [IU]/h via INTRAVENOUS
  Administered 2014-02-01: 15 [IU]/h via INTRAVENOUS
  Administered 2014-02-01: 15.7 [IU]/h via INTRAVENOUS
  Administered 2014-02-01: 4.9 [IU]/h via INTRAVENOUS
  Administered 2014-02-01: 17:00:00 via INTRAVENOUS
  Administered 2014-02-01: 8.7 [IU]/h via INTRAVENOUS
  Filled 2014-01-30 (×5): qty 1

## 2014-01-30 NOTE — MAU Note (Signed)
Pt reports she got up to go to the restroom at 0300 and when she wiped there was pink on the tissue. Has some pressure earlier tonight but none now.

## 2014-01-30 NOTE — Consult Note (Signed)
MFM Consultation:   Impressions: 1. Active SIUP at 86w6dpresenting for vaginal bleeding (3rd episode this pregnancy) in gestation complicated by GDMA2 (concern for noncompliant) 2. EFW is at the 60th percentile 3. No structural defects with limitations as documented above 4. Normal AFI 5. Placenta is implanted along the posterior uterine wall with no previa or retroplacental sonolucency.  However, I am concerned abruption given contractions, bleeding, and patient discomfort.  Inpatient management recommendations:  A. In house observation for 5 days after the bleed has resolved.  I would discontinue nifedipine tocolysis in setting of possible abruption/marginal abruption. B. I would initiate a course of antenatal corticosteroids. C. CEFM for 24 hours.  Then, if no decels, NST/EFM 2-3 times daily as inpatient (ie, this is the 3rd bleed). Provided no decelerations are noted, CEFM is not necessary. Of course, if a deceleration is seen then prolonged monitoring is again warranted. If recurrent spontaneous decelerations are seen indicating fetal distress then delivery is warranted. D.  Please monitor fasting and 2 hour postprandial glycemic measures in house. If elevated and requiring coverage by sliding scale of insulin. E.  While getting steroids, I would recommend starting insulin drip for the next 48 hours (insulin drip protocol would require glycemic measures q1-2 hours).   F. Then resume glycemic coverage with outpatient regimen and pattern/adjust accordingly.  Our service can assist in this if desired. G. Once criteria for outpatient management are met, I recommend twice weekly NST along with weekly ultrasounds for AFI and monthly for interval growth  Time Spent: I spent in excess of 30 minutes in consultation with this patient to review records, evaluate her case, and provide her with an adequate discussion and education.  More than 50% of this time was spent in direct face-to-face  counseling. It was a pleasure seeing your patient in the office today.  Thank you for consultation. Please do not hesitate to contact our service for any further questions.   Level II  Thank you,  JDelman CheadleDHarl Favor JDelman Cheadle MD, MS, FACOG Assistant Professor Section of [place]

## 2014-01-30 NOTE — Progress Notes (Signed)
Antenatal Nutrition Assessment:  Currently  32 6/[redacted] weeks gestation, with vaginal bleeding, PTL. Height  64 "  Weight 178 lbs  pre-pregnancy weight unknown at this time, weight 2/24: 162 lbs lbs .  Pre-pregnancy  BMI 27.9  IBW 120 lbs Total weight gain 16.lbs Weight gain goals 15-25 lbs Estimated needs: 19-2100 kcal/day, 80-90 grams protein/day, 2.2 liters fluid/day  Carbohydrate modified diet  Current diet prescription will provide for increased needs.  No abnormal nutrition related labs  CBG (last 3)   Recent Labs  01/30/14 1230  GLUCAP 103*      Nutrition Dx: Increased nutrient needs r/t pregnancy and fetal growth requirements aeb [redacted] weeks gestation.  No educational needs assessed at this time.Pt completed GDM education at [facility], [facility], [facility] on 7/9. Hx of prev preg with GDM  Melanie [name] LDN Neonatal Nutrition Support Specialist/RD III Pager [phone]

## 2014-01-30 NOTE — MAU Note (Addendum)
PT SAYS AT 0300- SHE WAS ASLEEP -   THEN WENT TO B-ROOM-  WHEN SHE WIPED - SHE SAW  PINK BLOOD.Marland Kitchen  WHEN SHE ARRIVED  AND COLLECTED UA- WIPED- SAYS SHE SAW A DOT ON PAPER.   SAYS SHE FELT PRESSURE AT 0300-  DENIES PRESSURE NOW .   ON Monday  WHEN SHE LAYED DOWN SHE  FELT LOWER ABD PAIN--  NONE NOW. NO SEX- X1 MTH.  LAST OFFICE VISIT-   Thursday- ALL  OK- NO VE.      NEXT APPOINTMENT  IS  TOMORROW.      IN RM 1- NO BLEEDING.  DENIES SROM OR INCREASE D/C.   PT  SAYS SHE HAS A PREVIA-  AT LAST VISIT   SAYS OFFICE SAYS PREVIA HAD MOVED AND THEY VE-     CLOSED

## 2014-01-30 NOTE — MAU Note (Signed)
PT  CHECKED OWN BLOOD SUGAR  - 125

## 2014-01-30 NOTE — Progress Notes (Signed)
Inpatient Diabetes Program Recommendations  AACE/ADA: New Consensus Statement on Inpatient Glycemic Control (2013)  Target Ranges:  Prepandial:   less than 140 mg/dL      Peak postprandial:   less than 180 mg/dL (1-2 hours)      Critically ill patients:  140 - 180 mg/dL   Paged by RN for assistance with ordering IV Insulin/GlucoStabilizer for 28 y.o. female admitted for glycemic control due to her A2GDM and  preterm contractions. RN states orders have been placed and pt is on insulin gtt.  Her 1HR GTT was 306 and she was non-compliant with insulin and checking her sugars.  Notes per MD in the last several days all of her BS have been elevated, some in the "200's).  Attended Gestational Diabetes Self-Management Class on 01/16/2014. 28 yo G4P3 at 32 weeks 4 days with uncontrolled A2GDM and preterm contractions  Insulin dosage calculated to be 1.2U/Kg Patient is 82.6kg  Breakfast: 22 U Regular NPH 45  Dinner 16 U regular  Bedtime: 16 U NPH Betamethasone 12 mg given at 1800. Continue with insulin drip/GlucoStabilizer until blood sugars stabilized.  Diabetes Coordinator to f/u on 7/23. Thank you. Lorenda Peck, RD, LDN, CDE Inpatient Diabetes Coordinator [phone]

## 2014-01-30 NOTE — Discharge Instructions (Signed)
Keep appointment as scheduled   Pelvic Rest Pelvic rest is sometimes recommended for women when:   The placenta is partially or completely covering the opening of the cervix (placenta previa).  There is bleeding between the uterine wall and the amniotic sac in the first trimester (subchorionic hemorrhage).  The cervix begins to open without labor starting (incompetent cervix, cervical insufficiency).  The labor is too early (preterm labor). HOME CARE INSTRUCTIONS  Do not have sexual intercourse, stimulation, or an orgasm.  Do not use tampons, douche, or put anything in the vagina.  Do not lift anything over 10 pounds (4.5 kg).  Avoid strenuous activity or straining your pelvic muscles. SEEK MEDICAL CARE IF:  You have any vaginal bleeding during pregnancy. Treat this as a potential emergency.  You have cramping pain felt low in the stomach (stronger than menstrual cramps).  You notice vaginal discharge (watery, mucus, or bloody).  You have a low, dull backache.  There are regular contractions or uterine tightening. SEEK IMMEDIATE MEDICAL CARE IF: You have vaginal bleeding and have placenta previa.  Document Released: 10/23/2010 Document Revised: 09/20/2011 Document Reviewed: 10/23/2010 [facility] Patient Information 2015 [place], [place]. This information is not intended to replace advice given to you by your health care provider. Make sure you discuss any questions you have with your health care provider. Third Trimester of Pregnancy The third trimester is from week 29 through week 42, months 7 through 9. The third trimester is a time when the fetus is growing rapidly. At the end of the ninth month, the fetus is about 20 inches in length and weighs 6-10 pounds.  BODY CHANGES Your body goes through many changes during pregnancy. The changes vary from woman to woman.   Your weight will continue to increase. You can expect to gain 25-35 pounds (11-16 kg) by the end of the  pregnancy.  You may begin to get stretch marks on your hips, abdomen, and breasts.  You may urinate more often because the fetus is moving lower into your pelvis and pressing on your bladder.  You may develop or continue to have heartburn as a result of your pregnancy.  You may develop constipation because certain hormones are causing the muscles that push waste through your intestines to slow down.  You may develop hemorrhoids or swollen, bulging veins (varicose veins).  You may have pelvic pain because of the weight gain and pregnancy hormones relaxing your joints between the bones in your pelvis. Backaches may result from overexertion of the muscles supporting your posture.  You may have changes in your hair. These can include thickening of your hair, rapid growth, and changes in texture. Some women also have hair loss during or after pregnancy, or hair that feels dry or thin. Your hair will most likely return to normal after your baby is born.  Your breasts will continue to grow and be tender. A yellow discharge may leak from your breasts called colostrum.  Your belly button may stick out.  You may feel short of breath because of your expanding uterus.  You may notice the fetus "dropping," or moving lower in your abdomen.  You may have a bloody mucus discharge. This usually occurs a few days to a week before labor begins.  Your cervix becomes thin and soft (effaced) near your due date. WHAT TO EXPECT AT YOUR PRENATAL EXAMS  You will have prenatal exams every 2 weeks until week 36. Then, you will have weekly prenatal exams. During a routine prenatal  visit:  You will be weighed to make sure you and the fetus are growing normally.  Your blood pressure is taken.  Your abdomen will be measured to track your baby's growth.  The fetal heartbeat will be listened to.  Any test results from the previous visit will be discussed.  You may have a cervical check near your due date to  see if you have effaced. At around 36 weeks, your caregiver will check your cervix. At the same time, your caregiver will also perform a test on the secretions of the vaginal tissue. This test is to determine if a type of bacteria, Group B streptococcus, is present. Your caregiver will explain this further. Your caregiver may ask you:  What your birth plan is.  How you are feeling.  If you are feeling the baby move.  If you have had any abnormal symptoms, such as leaking fluid, bleeding, severe headaches, or abdominal cramping.  If you have any questions. Other tests or screenings that may be performed during your third trimester include:  Blood tests that check for low iron levels (anemia).  Fetal testing to check the health, activity level, and growth of the fetus. Testing is done if you have certain medical conditions or if there are problems during the pregnancy. FALSE LABOR You may feel small, irregular contractions that eventually go away. These are called Braxton Hicks contractions, or false labor. Contractions may last for hours, days, or even weeks before true labor sets in. If contractions come at regular intervals, intensify, or become painful, it is best to be seen by your caregiver.  SIGNS OF LABOR   Menstrual-like cramps.  Contractions that are 5 minutes apart or less.  Contractions that start on the top of the uterus and spread down to the lower abdomen and back.  A sense of increased pelvic pressure or back pain.  A watery or bloody mucus discharge that comes from the vagina. If you have any of these signs before the 37th week of pregnancy, call your caregiver right away. You need to go to the hospital to get checked immediately. HOME CARE INSTRUCTIONS   Avoid all smoking, herbs, alcohol, and unprescribed drugs. These chemicals affect the formation and growth of the baby.  Follow your caregiver's instructions regarding medicine use. There are medicines that are  either safe or unsafe to take during pregnancy.  Exercise only as directed by your caregiver. Experiencing uterine cramps is a good sign to stop exercising.  Continue to eat regular, healthy meals.  Wear a good support bra for breast tenderness.  Do not use hot tubs, steam rooms, or saunas.  Wear your seat belt at all times when driving.  Avoid raw meat, uncooked cheese, cat litter boxes, and soil used by cats. These carry germs that can cause birth defects in the baby.  Take your prenatal vitamins.  Try taking a stool softener (if your caregiver approves) if you develop constipation. Eat more high-fiber foods, such as fresh vegetables or fruit and whole grains. Drink plenty of fluids to keep your urine clear or pale yellow.  Take warm sitz baths to soothe any pain or discomfort caused by hemorrhoids. Use hemorrhoid cream if your caregiver approves.  If you develop varicose veins, wear support hose. Elevate your feet for 15 minutes, 3-4 times a day. Limit salt in your diet.  Avoid heavy lifting, wear low heal shoes, and practice good posture.  Rest a lot with your legs elevated if you have leg cramps  or low back pain.  Visit your dentist if you have not gone during your pregnancy. Use a soft toothbrush to brush your teeth and be gentle when you floss.  A sexual relationship may be continued unless your caregiver directs you otherwise.  Do not travel far distances unless it is absolutely necessary and only with the approval of your caregiver.  Take prenatal classes to understand, practice, and ask questions about the labor and delivery.  Make a trial run to the hospital.  Pack your hospital bag.  Prepare the baby's nursery.  Continue to go to all your prenatal visits as directed by your caregiver. SEEK MEDICAL CARE IF:  You are unsure if you are in labor or if your water has broken.  You have dizziness.  You have mild pelvic cramps, pelvic pressure, or nagging pain in  your abdominal area.  You have persistent nausea, vomiting, or diarrhea.  You have a bad smelling vaginal discharge.  You have pain with urination. SEEK IMMEDIATE MEDICAL CARE IF:   You have a fever.  You are leaking fluid from your vagina.  You have spotting or bleeding from your vagina.  You have severe abdominal cramping or pain.  You have rapid weight loss or gain.  You have shortness of breath with chest pain.  You notice sudden or extreme swelling of your face, hands, ankles, feet, or legs.  You have not felt your baby move in over an hour.  You have severe headaches that do not go away with medicine.  You have vision changes. Document Released: 06/22/2001 Document Revised: 07/03/2013 Document Reviewed: 08/29/2012 [facility] Patient Information 2015 [place], [place]. This information is not intended to replace advice given to you by your health care provider. Make sure you discuss any questions you have with your health care provider.

## 2014-01-30 NOTE — H&P (Signed)
Melanie [name] is a 28 y.o. female admitted for glycemic control due to her A2GDM also for  Preterm contractions.  Early in pregnancy patient with bleeding due to an expanding subchorionic Hematoma and marginal previa. That has since resolved. Overnight patient evaluated in MAU for bleeding.  Per patient h/o she feels contractions approx every 10 minutes. She notes still spotting, has abnormal discharge and  Vaginal pruritus.  Her 1HR GTT was 306 and she was non-compliant with insulin and checking her sugars.  She notes in the last several days all of her BS have been elevated, some in the "200's).  Maternal Medical History:  Reason for admission: Contractions.  Nausea.  Contractions: Onset was 6-12 hours ago.   Frequency: regular.   Duration is approximately 60 seconds.   Perceived severity is moderate.    Fetal activity: Perceived fetal activity is normal.   Last perceived fetal movement was within the past 12 hours.    Prenatal complications: Bleeding.   Prenatal Complications - Diabetes: Diabetes is managed by insulin injections.      OB History   Grav Para Term Preterm Abortions TAB SAB Ect Mult Living   4 3 2 1      3      Past Medical History  Diagnosis Date  . Anemia   . Gestational diabetes mellitus in pregnancy   . Cardiac tumor   . Crohn's disease   . History of genital warts   . Gestational diabetes    Past Surgical History  Procedure Laterality Date  . Cardiac surgery      at 28 years old   Family History: family history includes Alcohol abuse in her father; Diabetes in her father, maternal grandmother, and paternal grandmother; Heart Problems in her maternal aunt and mother; Hypertension in her mother; Miscarriages / [place] in her mother. Social History:  reports that she has never smoked. She has never used smokeless tobacco. She reports that she drinks alcohol. She reports that she uses illicit drugs (Marijuana) about once per week.   Prenatal  Transfer Tool  Maternal Diabetes: Yes:  Diabetes Type:  Insulin/Medication controlled Genetic Screening: Normal Maternal Ultrasounds/Referrals: Normal Fetal Ultrasounds or other Referrals:  Fetal echo, Referred to Materal Fetal Medicine  Maternal Substance Abuse:  Yes:  Type: Marijuana Significant Maternal Medications:  None Significant Maternal Lab Results:  Lab values include: Group B Strep positive Other Comments:  None  Review of Systems  Constitutional: Negative for fever and chills.  HENT: Negative for hearing loss.   Respiratory: Negative for cough.   Gastrointestinal: Negative for nausea and vomiting.  Genitourinary: Negative for dysuria.  Skin: Negative for rash.  Neurological: Negative for headaches.      Blood pressure 108/68, pulse 97, temperature 98.7 F (37.1 C), temperature source Oral, resp. rate 16, height 5\' 4"  (1.626 m), weight 80.74 kg (178 lb). Maternal Exam:  Uterine Assessment: Contraction strength is moderate.  Contraction duration is 50 seconds. Contraction frequency is regular.   Abdomen: Patient reports no abdominal tenderness. Fundal height is 33 cm.   Estimated fetal weight is 2300 grams.   Fetal presentation: vertex  Introitus: Normal vulva. Normal vagina.  Ferning test: not done.  Nitrazine test: not done. Amniotic fluid character: not assessed.  Pelvis: adequate for delivery.   Cervix: Cervix evaluated by sterile speculum exam and digital exam.   Pink discharge wet mount positive for hyphae MAU exam: 1 / thick and high  Physical Exam  Prenatal labs: ABO, Rh: --/--/O POS (  04/06 1730) Antibody: NEG (04/06 1730) Rubella:  Immune RPR:   NR HBsAg:   Neg HIV:   NEG GBS:   POS  Assessment/Plan: 28 yo G4P3 at 32 weeks 4 days with uncontrolled A2GDM and preterm contractions Insulin dosage calculated to be 1.2U/Kg  Patient is 82.6kg  Breakfast: 22 U Regular NPH 45 Dinner 16 U regular Bedtime: 16 U NPH Continuous monitoring, will consider  tocolysis with procardia. Patient with yeast infection will treat MFM consultation   Brenham, Fortine 01/30/2014, 1:47 PM

## 2014-01-30 NOTE — MAU Provider Note (Signed)
CSN: [id number]     Arrival date & time 01/30/14  0410 History   None    Chief Complaint  Patient presents with  . Vaginal Bleeding     (Consider location/radiation/quality/duration/timing/severity/associated sxs/prior Treatment) Patient is a 29 y.o. female presenting with vaginal bleeding. The history is provided by the patient.  Vaginal Bleeding This is a new problem. The current episode started today. The problem occurs intermittently. The problem has been unchanged. Nothing aggravates the symptoms. She has tried nothing for the symptoms.  Melanie [name] is a 28 y.o. [phone] @[redacted]w[redacted]d  gestation who presents to the MAU with vaginal bleeding. She states that she got up to go to the bathroom and noted blood on the tissue when she wiped. Hx of placenta previa per patient. PMH significant for gestational diabetes, anemia, Crohn's disease and cardiac tumor. Patient denies pain.   Past Medical History  Diagnosis Date  . Anemia   . Gestational diabetes mellitus in pregnancy   . Cardiac tumor   . Crohn's disease   . History of genital warts    Past Surgical History  Procedure Laterality Date  . Cardiac surgery      at 28 years old   Family History  Problem Relation Age of Onset  . Hypertension Mother   . Heart Problems Mother     tumor, open heart surgery  . Heart Problems Maternal Aunt     tumor  . Diabetes Father   . Diabetes Paternal Grandmother   . Diabetes Maternal Grandmother    History  Substance Use Topics  . Smoking status: Never Smoker   . Smokeless tobacco: Never Used  . Alcohol Use: Yes     Comment: occasionally. NONE SINCE FOUND OUT WAS PREG   OB History   Grav Para Term Preterm Abortions TAB SAB Ect Mult Living   4 3 2 1      3      Review of Systems  Genitourinary: Positive for vaginal bleeding.       [redacted]w[redacted]d gestation   All other systems negative    Allergies  Review of patient's allergies indicates no known allergies.  Home Medications   Prior  to Admission medications   Medication Sig Start Date End Date Taking? Authorizing Provider  insulin NPH Human (HUMULIN N,NOVOLIN N) 100 UNIT/ML injection Inject 10 Units into the skin daily before breakfast.   Yes Historical Provider, MD  insulin regular (NOVOLIN R,HUMULIN R) 100 units/mL injection Inject 20 Units into the skin daily before breakfast.   Yes Historical Provider, MD  Prenatal Vit-Fe Fumarate-FA (PRENATAL MULTIVITAMIN) TABS tablet Take 1 tablet by mouth daily at 12 noon.   Yes Historical Provider, MD  acetaminophen (TYLENOL) 500 MG tablet Take 500 mg by mouth every 4 (four) hours as needed for moderate pain.    Historical Provider, MD   BP 120/62  Pulse 77  Temp(Src) 98.5 F (36.9 C) (Oral)  Resp 16  Ht 5\' 5"  (1.651 m)  Wt 178 lb (80.74 kg)  BMI 29.62 kg/m2  SpO2 99% Physical Exam  Nursing note and vitals reviewed. Constitutional: She is oriented to person, place, and time. She appears well-developed and well-nourished. No distress.  Eyes: Conjunctivae and EOM are normal.  Neck: Neck supple.  Cardiovascular: Normal rate.   Pulmonary/Chest: Effort normal.  Abdominal: Soft. There is no tenderness.  Gravid consistent with dates.   Musculoskeletal: Normal range of motion.  Neurological: She is alert and oriented to person, place, and time. No cranial nerve  deficit.  Skin: Skin is warm and dry.  Psychiatric: She has a normal mood and affect. Her behavior is normal.   Blood type O positive ED Course  Procedures  EFM: baseline 135, with 2 contractions over a 30 minute period. Reactive.  Discussed with Dr. Harrington Challenger and will plan to d/c patient to follow up in the office.  After speaking with Dr. Harrington Challenger the patient began contracting again. Will continue to observe and PO hydration. Procardia given.  MDM  28 y.o. [id number] @ [redacted]w[redacted]d gestation with vaginal bleeding and hx of partial previa and gestational diabetes. Will continue to observe patient and PO hydration and will d/c home if  contractions stop. RN to call Dr. Harrington Challenger with update and Dr. Harrington Challenger will determine plan of care.

## 2014-01-31 LAB — GLUCOSE, CAPILLARY
GLUCOSE-CAPILLARY: 111 mg/dL — AB (ref 70–99)
GLUCOSE-CAPILLARY: 112 mg/dL — AB (ref 70–99)
GLUCOSE-CAPILLARY: 120 mg/dL — AB (ref 70–99)
GLUCOSE-CAPILLARY: 121 mg/dL — AB (ref 70–99)
GLUCOSE-CAPILLARY: 125 mg/dL — AB (ref 70–99)
GLUCOSE-CAPILLARY: 127 mg/dL — AB (ref 70–99)
GLUCOSE-CAPILLARY: 131 mg/dL — AB (ref 70–99)
GLUCOSE-CAPILLARY: 132 mg/dL — AB (ref 70–99)
GLUCOSE-CAPILLARY: 134 mg/dL — AB (ref 70–99)
GLUCOSE-CAPILLARY: 141 mg/dL — AB (ref 70–99)
GLUCOSE-CAPILLARY: 96 mg/dL (ref 70–99)
Glucose-Capillary: 109 mg/dL — ABNORMAL HIGH (ref 70–99)
Glucose-Capillary: 111 mg/dL — ABNORMAL HIGH (ref 70–99)
Glucose-Capillary: 92 mg/dL (ref 70–99)
Glucose-Capillary: 110 mg/dL — ABNORMAL HIGH (ref 70–99)
Glucose-Capillary: 131 mg/dL — ABNORMAL HIGH (ref 70–99)
Glucose-Capillary: 102 mg/dL — ABNORMAL HIGH (ref 70–99)
Glucose-Capillary: 128 mg/dL — ABNORMAL HIGH (ref 70–99)
Glucose-Capillary: 137 mg/dL — ABNORMAL HIGH (ref 70–99)
Glucose-Capillary: 116 mg/dL — ABNORMAL HIGH (ref 70–99)
Glucose-Capillary: 141 mg/dL — ABNORMAL HIGH (ref 70–99)
Glucose-Capillary: 159 mg/dL — ABNORMAL HIGH (ref 70–99)
Glucose-Capillary: 154 mg/dL — ABNORMAL HIGH (ref 70–99)

## 2014-01-31 NOTE — Progress Notes (Signed)
28 y.o. G4P2103 [redacted]w[redacted]d HD#1 admitted for 32WKS, UNCONTROLLED DIABETIES, possible abruption  Pt currently stable with no c/o except occ ctxes.  Good FM.  Filed Vitals:   01/31/14 0002 01/31/14 0359 01/31/14 0400 01/31/14 0804  BP: 117/64  114/62 113/51  Pulse: 80  92 73  Temp: 98.8 F (37.1 C) 98.4 F (36.9 C)  98.5 F (36.9 C)  TempSrc: Oral Oral  Oral  Resp: 18 18  18  Height:      Weight:      SpO2:        Lungs CTA Cor RRR Abd  Soft, gravid, nontender Ex SCDs FHTs  120s, good short term variability, NST R Toco  q5-10  Results for orders placed during the hospital encounter of 01/30/14 (from the past 24 hour(s))  GLUCOSE, CAPILLARY     Status: Abnormal   Collection Time    01/30/14 12:30 PM      Result Value Ref Range   Glucose-Capillary 103 (*) 70 - 99 mg/dL  CBC     Status: Abnormal   Collection Time    01/30/14 12:59 PM      Result Value Ref Range   WBC 8.0  4.0 - 10.5 K/uL   RBC 3.87  3.87 - 5.11 MIL/uL   Hemoglobin 12.1  12.0 - 15.0 g/dL   HCT 35.5 (*) 36.0 - 46.0 %   MCV 91.7  78.0 - 100.0 fL   MCH 31.3  26.0 - 34.0 pg   MCHC 34.1  30.0 - 36.0 g/dL   RDW 13.6  11.5 - 15.5 %   Platelets 221  150 - 400 K/uL  GLUCOSE, CAPILLARY     Status: Abnormal   Collection Time    01/30/14  3:29 PM      Result Value Ref Range   Glucose-Capillary 171 (*) 70 - 99 mg/dL  GLUCOSE, CAPILLARY     Status: Abnormal   Collection Time    01/30/14  7:46 PM      Result Value Ref Range   Glucose-Capillary 148 (*) 70 - 99 mg/dL  GLUCOSE, CAPILLARY     Status: Abnormal   Collection Time    01/30/14  8:49 PM      Result Value Ref Range   Glucose-Capillary 185 (*) 70 - 99 mg/dL  GLUCOSE, CAPILLARY     Status: Abnormal   Collection Time    01/30/14  9:50 PM      Result Value Ref Range   Glucose-Capillary 178 (*) 70 - 99 mg/dL  GLUCOSE, CAPILLARY     Status: Abnormal   Collection Time    01/30/14 10:53 PM      Result Value Ref Range   Glucose-Capillary 185 (*) 70 - 99 mg/dL   GLUCOSE, CAPILLARY     Status: Abnormal   Collection Time    01/30/14 11:51 PM      Result Value Ref Range   Glucose-Capillary 138 (*) 70 - 99 mg/dL  GLUCOSE, CAPILLARY     Status: Abnormal   Collection Time    01/31/14 12:50 AM      Result Value Ref Range   Glucose-Capillary 141 (*) 70 - 99 mg/dL  GLUCOSE, CAPILLARY     Status: Abnormal   Collection Time    01/31/14  1:53 AM      Result Value Ref Range   Glucose-Capillary 134 (*) 70 - 99 mg/dL  GLUCOSE, CAPILLARY     Status: Abnormal   Collection Time      01/31/14  2:52 AM      Result Value Ref Range   Glucose-Capillary 121 (*) 70 - 99 mg/dL  GLUCOSE, CAPILLARY     Status: Abnormal   Collection Time    01/31/14  3:55 AM      Result Value Ref Range   Glucose-Capillary 131 (*) 70 - 99 mg/dL  GLUCOSE, CAPILLARY     Status: Abnormal   Collection Time    01/31/14  4:58 AM      Result Value Ref Range   Glucose-Capillary 125 (*) 70 - 99 mg/dL  GLUCOSE, CAPILLARY     Status: Abnormal   Collection Time    01/31/14  6:01 AM      Result Value Ref Range   Glucose-Capillary 112 (*) 70 - 99 mg/dL  GLUCOSE, CAPILLARY     Status: Abnormal   Collection Time    01/31/14  6:57 AM      Result Value Ref Range   Glucose-Capillary 109 (*) 70 - 99 mg/dL  GLUCOSE, CAPILLARY     Status: Abnormal   Collection Time    01/31/14  8:01 AM      Result Value Ref Range   Glucose-Capillary 111 (*) 70 - 99 mg/dL    A:  HD#1  [redacted]w[redacted]d with uncontrolled A2GDM and possible abruption.  P: Per Dr Denney;  Impressions:  1. Active SIUP at [redacted]w[redacted]d presenting for vaginal bleeding (3rd episode this pregnancy) in gestation complicated by GDMA2 (concern for noncompliant)  2. EFW is at the 60th percentile  3. No structural defects with limitations as documented above  4. Normal AFI  5. Placenta is implanted along the posterior uterine wall with no previa or retroplacental sonolucency. However, I am concerned abruption given contractions, bleeding, and patient  discomfort.  Inpatient management recommendations:  A. In house observation for 5 days after the bleed has resolved. I would discontinue nifedipine tocolysis in setting of possible abruption/marginal abruption.  B. I would initiate a course of antenatal corticosteroids.  C. CEFM for 24 hours. Then, if no decels, NST/EFM 2-3 times daily as inpatient (ie, this is the 3rd bleed). Provided no decelerations are noted, CEFM is not necessary. Of course, if a deceleration is seen then prolonged monitoring is again warranted. If recurrent spontaneous decelerations are seen indicating fetal distress then delivery is warranted.  D. Please monitor fasting and 2 hour postprandial glycemic measures in house. If elevated and requiring coverage by sliding scale of insulin.  E. While getting steroids, I would recommend starting insulin drip for the next 48 hours (insulin drip protocol would require glycemic measures q1-2 hours).  F. Then resume glycemic coverage with outpatient regimen and pattern/adjust accordingly. Our service can assist in this if desired.  G. Once criteria for outpatient management are met, I recommend twice weekly NST along with weekly ultrasounds for AFI and monthly for interval growth   Second dose of BMZ is at 6 pm tonight.  Sugars stable on drip- on drip while receiving steroids. EFW 4#10, VTX,  , A   

## 2014-02-01 LAB — GLUCOSE, CAPILLARY
GLUCOSE-CAPILLARY: 107 mg/dL — AB (ref 70–99)
GLUCOSE-CAPILLARY: 148 mg/dL — AB (ref 70–99)
GLUCOSE-CAPILLARY: 222 mg/dL — AB (ref 70–99)
GLUCOSE-CAPILLARY: 76 mg/dL (ref 70–99)
GLUCOSE-CAPILLARY: 87 mg/dL (ref 70–99)
GLUCOSE-CAPILLARY: 92 mg/dL (ref 70–99)
GLUCOSE-CAPILLARY: 93 mg/dL (ref 70–99)
GLUCOSE-CAPILLARY: 93 mg/dL (ref 70–99)
GLUCOSE-CAPILLARY: 99 mg/dL (ref 70–99)
Glucose-Capillary: 102 mg/dL — ABNORMAL HIGH (ref 70–99)
Glucose-Capillary: 108 mg/dL — ABNORMAL HIGH (ref 70–99)
Glucose-Capillary: 111 mg/dL — ABNORMAL HIGH (ref 70–99)
Glucose-Capillary: 111 mg/dL — ABNORMAL HIGH (ref 70–99)
Glucose-Capillary: 114 mg/dL — ABNORMAL HIGH (ref 70–99)
Glucose-Capillary: 119 mg/dL — ABNORMAL HIGH (ref 70–99)
Glucose-Capillary: 147 mg/dL — ABNORMAL HIGH (ref 70–99)
Glucose-Capillary: 222 mg/dL — ABNORMAL HIGH (ref 70–99)
Glucose-Capillary: 85 mg/dL (ref 70–99)
Glucose-Capillary: 90 mg/dL (ref 70–99)
Glucose-Capillary: 94 mg/dL (ref 70–99)
Glucose-Capillary: 95 mg/dL (ref 70–99)

## 2014-02-01 MED ORDER — INSULIN REGULAR BOLUS VIA INFUSION
0.0000 [IU] | Freq: Three times a day (TID) | INTRAVENOUS | Status: DC
  Administered 2014-02-01: 5 [IU] via INTRAVENOUS
  Filled 2014-02-01: qty 10

## 2014-02-01 MED ORDER — INSULIN REGULAR HUMAN 100 UNIT/ML IJ SOLN
10.0000 [IU] | Freq: Every day | INTRAMUSCULAR | Status: DC
  Filled 2014-02-01: qty 0.1

## 2014-02-01 MED ORDER — INSULIN REGULAR HUMAN 100 UNIT/ML IJ SOLN
2.0000 [IU] | Freq: Once | INTRAMUSCULAR | Status: AC
  Administered 2014-02-01: 2 [IU] via SUBCUTANEOUS
  Filled 2014-02-01: qty 0.02

## 2014-02-01 MED ORDER — INSULIN NPH (HUMAN) (ISOPHANE) 100 UNIT/ML ~~LOC~~ SUSP
20.0000 [IU] | Freq: Every day | SUBCUTANEOUS | Status: DC
  Filled 2014-02-01: qty 10

## 2014-02-01 NOTE — Progress Notes (Signed)
HD#3 possible abruption, poorly controlled GDMA2.  Reports no further bleeding since admission.  Good FM.  No LOF. No other complaints.  Filed Vitals:   02/01/14 0404 02/01/14 0813 02/01/14 1200 02/01/14 1628  BP: 107/45 123/58 107/56 103/49  Pulse: 67 71 77 71  Temp: 98.6 F (37 C) 98.2 F (36.8 C) 98.4 F (36.9 C) 98.6 F (37 C)  TempSrc:  Oral Oral Oral  Resp: 20 18 18 24   Height:      Weight:      SpO2:        Lab Results  Component Value Date   WBC 8.0 01/30/2014   HGB 12.1 01/30/2014   HCT 35.5* 01/30/2014   MCV 91.7 01/30/2014   PLT 221 01/30/2014   FHT: Cat 1 NST R TOCO: quiet SVE: deferred  A/P: [id number] @ [redacted]w[redacted]d with possibly abruption on [place] and poorly controlled GDMA2 Per pt was not taking insulin as directed, no on glucose stabilizer regimen with insulin drip and meal coverage s/p beta methasone x 2.  Will d/c insulin drip tonight and restart home regimen with NPH and regular.  Will adjust as needed over the next several days and monitor BS.  Will also monitor for signs of abruption.  Active T&S. Recheck CBC in am.  Allyn Kenner

## 2014-02-01 NOTE — Progress Notes (Signed)
   Nutrition Dx: Food and nutrition-related knowledge deficit r/t limited comprehension of previous education aeb pt report.  Nutrition education consult for Carbohydrate Modified Gestational Diabetic Diet completed.  "Meal  plan for gestational diabetics" handout given to patient.  Basic concepts reviewed.  Questions answered.  Patient verbalizes understanding of basic concepts  Cathlean Sauer.Fredderick Severance LDN Neonatal Nutrition Support Specialist/RD III Pager [phone]

## 2014-02-01 NOTE — Progress Notes (Signed)
Dietician here to see pt.

## 2014-02-02 LAB — GLUCOSE, CAPILLARY
GLUCOSE-CAPILLARY: 175 mg/dL — AB (ref 70–99)
GLUCOSE-CAPILLARY: 81 mg/dL (ref 70–99)
GLUCOSE-CAPILLARY: 137 mg/dL — AB (ref 70–99)
GLUCOSE-CAPILLARY: 149 mg/dL — AB (ref 70–99)
Glucose-Capillary: 110 mg/dL — ABNORMAL HIGH (ref 70–99)
Glucose-Capillary: 112 mg/dL — ABNORMAL HIGH (ref 70–99)
Glucose-Capillary: 150 mg/dL — ABNORMAL HIGH (ref 70–99)
Glucose-Capillary: 152 mg/dL — ABNORMAL HIGH (ref 70–99)
Glucose-Capillary: 164 mg/dL — ABNORMAL HIGH (ref 70–99)
Glucose-Capillary: 169 mg/dL — ABNORMAL HIGH (ref 70–99)
Glucose-Capillary: 80 mg/dL (ref 70–99)
Glucose-Capillary: 80 mg/dL (ref 70–99)
Glucose-Capillary: 83 mg/dL (ref 70–99)
Glucose-Capillary: 96 mg/dL (ref 70–99)
Glucose-Capillary: 98 mg/dL (ref 70–99)

## 2014-02-02 LAB — TYPE AND SCREEN
ABO/RH(D): O POS
Antibody Screen: NEGATIVE

## 2014-02-02 MED ORDER — SODIUM CHLORIDE 0.9 % IV SOLN: INTRAVENOUS | Status: DC

## 2014-02-02 MED ORDER — INSULIN NPH (HUMAN) (ISOPHANE) 100 UNIT/ML ~~LOC~~ SUSP
20.0000 [IU] | Freq: Every morning | SUBCUTANEOUS | Status: DC
  Administered 2014-02-02: 20 [IU] via SUBCUTANEOUS

## 2014-02-02 MED ORDER — DEXTROSE-NACL 5-0.45 % IV SOLN
INTRAVENOUS | Status: DC
  Administered 2014-02-02 – 2014-02-03 (×2): via INTRAVENOUS

## 2014-02-02 MED ORDER — INSULIN ASPART 100 UNIT/ML ~~LOC~~ SOLN
0.0000 [IU] | Freq: Three times a day (TID) | SUBCUTANEOUS | Status: DC
  Administered 2014-02-02: 3 [IU] via SUBCUTANEOUS

## 2014-02-02 MED ORDER — DEXTROSE-NACL 5-0.45 % IV SOLN
INTRAVENOUS | Status: DC
  Administered 2014-02-02: via INTRAVENOUS

## 2014-02-02 MED ORDER — DEXTROSE-NACL 5-0.45 % IV SOLN
INTRAVENOUS | Status: DC
  Administered 2014-02-02: 08:00:00 via INTRAVENOUS

## 2014-02-02 MED ORDER — INSULIN REGULAR BOLUS VIA INFUSION: 0.0000 [IU] | Freq: Three times a day (TID) | INTRAVENOUS | Status: DC

## 2014-02-02 MED ORDER — SODIUM CHLORIDE 0.9 % IJ SOLN: 3.0000 mL | Freq: Two times a day (BID) | INTRAMUSCULAR | Status: DC

## 2014-02-02 MED ORDER — INSULIN ASPART 100 UNIT/ML ~~LOC~~ SOLN: 10.0000 [IU] | Freq: Every day | SUBCUTANEOUS | Status: DC

## 2014-02-02 MED ORDER — SODIUM CHLORIDE 0.9 % IV SOLN
INTRAVENOUS | Status: DC
  Administered 2014-02-02: 04:00:00 via INTRAVENOUS
  Administered 2014-02-02: 29.2 [IU]/h via INTRAVENOUS
  Filled 2014-02-02: qty 1

## 2014-02-02 MED ORDER — INSULIN REGULAR BOLUS VIA INFUSION
0.0000 [IU] | Freq: Three times a day (TID) | INTRAVENOUS | Status: DC
  Filled 2014-02-02: qty 10

## 2014-02-02 MED ORDER — DEXTROSE 50 % IV SOLN: 25.0000 mL | INTRAVENOUS | Status: DC | PRN

## 2014-02-02 MED ORDER — FAMOTIDINE 20 MG PO TABS: 20.0000 mg | ORAL_TABLET | Freq: Every day | ORAL | Status: DC | PRN

## 2014-02-02 MED ORDER — SODIUM CHLORIDE 0.9 % IJ SOLN
3.0000 mL | INTRAMUSCULAR | Status: DC | PRN
  Administered 2014-02-04: 3 mL via INTRAVENOUS

## 2014-02-02 MED ORDER — LACTATED RINGERS IV SOLN: INTRAVENOUS | Status: DC

## 2014-02-02 MED ORDER — SODIUM CHLORIDE 0.9 % IV SOLN
INTRAVENOUS | Status: DC
  Administered 2014-02-02: 0.9 [IU]/h via INTRAVENOUS
  Filled 2014-02-02: qty 1

## 2014-02-02 MED ORDER — LACTATED RINGERS IV BOLUS (SEPSIS)
500.0000 mL | Freq: Once | INTRAVENOUS | Status: AC
  Administered 2014-02-02: 18:00:00 via INTRAVENOUS

## 2014-02-02 MED ORDER — POLYETHYLENE GLYCOL 3350 17 G PO PACK
17.0000 g | PACK | Freq: Every day | ORAL | Status: DC | PRN
  Administered 2014-02-04 – 2014-02-05 (×2): 17 g via ORAL
  Filled 2014-02-02 (×2): qty 1

## 2014-02-02 MED ORDER — INSULIN ASPART 100 UNIT/ML ~~LOC~~ SOLN
2.0000 [IU] | Freq: Once | SUBCUTANEOUS | Status: AC
  Administered 2014-02-02: 2 [IU] via SUBCUTANEOUS

## 2014-02-02 NOTE — Progress Notes (Signed)
HD#4 suspected abruption and uncontrolled GMDA2 on insulin.  Denies any complaints today.  No bleeding, good FM.  Filed Vitals:   02/02/14 0000 02/02/14 0110 02/02/14 0210 02/02/14 0739  BP:    119/52  Pulse:    69  Temp:    98.5 F (36.9 C)  TempSrc:    Oral  Resp: 18 16 16 18   Height:      Weight:      SpO2:        Lab Results  Component Value Date   WBC 8.0 01/30/2014   HGB 12.1 01/30/2014   HCT 35.5* 01/30/2014   MCV 91.7 01/30/2014   PLT 221 01/30/2014   GEN: NAD Abd: NT, gravid Ext: SCDs in place FHT: 155 mod var + accels, deceleration to 60 for approx 4-5 minutes recently, recovery to baseline - pt had been OOB to toilet. TOCO: none SVE: deferred  A/P: 27yo [id number] at 33.2 with suspected abruption and uncontrolled GDMA2 1) Fetal HR deceleration - CEFM, bedside commode, alerted RN to call me with any further decels, no bleeding 2) GDMA2 - discussed with patient appropriate diet, longterm risks of GDM, receptive to dietary changes.  Insulin drip d/c'd and [place] NPH started this am.  Fasting and 2hr pp sugar checks, will adjust insulin regimen as needed. Continue other routine care.  Allyn Kenner

## 2014-02-02 NOTE — Progress Notes (Signed)
Alerted by RN of episode of small amount of bright red bleeding.  Pad changed and I assessed and found minimal/scant red blood on subsequent pad.  Pt reports feeling on and off contractions throughout the day but did not alert anyone. Advised to let RN know if worsening or more frequent.  FHT 135 and reactive, no decelerations, one prolonged decel noted this am, none since.  Pt received morning NPH; Breakfast and lunch coverage with short acting insulin 3U each.  Pt now NPO while evaluating ctx and bleeding.  T&S active.  IVF restarted -LR @125 .  Will monitor BS q2 hrs while NPO and consider restarting drip as needed.

## 2014-02-02 NOTE — Progress Notes (Signed)
Small amount of red/mucous VB noted after voiding.  Pt denies any pain.  Peripad on patient and no active bleeding noted from vagina.

## 2014-02-02 NOTE — Progress Notes (Signed)
MD made aware of pt VB and no pain but FHR reassurring.

## 2014-02-02 NOTE — Progress Notes (Signed)
peripad changed, small amount red bllod smeared on pad

## 2014-02-03 ENCOUNTER — Inpatient Hospital Stay (HOSPITAL_COMMUNITY): Payer: Medicaid Other

## 2014-02-03 LAB — GLUCOSE, CAPILLARY
GLUCOSE-CAPILLARY: 100 mg/dL — AB (ref 70–99)
GLUCOSE-CAPILLARY: 107 mg/dL — AB (ref 70–99)
GLUCOSE-CAPILLARY: 116 mg/dL — AB (ref 70–99)
GLUCOSE-CAPILLARY: 142 mg/dL — AB (ref 70–99)
GLUCOSE-CAPILLARY: 163 mg/dL — AB (ref 70–99)
GLUCOSE-CAPILLARY: 164 mg/dL — AB (ref 70–99)
GLUCOSE-CAPILLARY: 98 mg/dL (ref 70–99)
Glucose-Capillary: 160 mg/dL — ABNORMAL HIGH (ref 70–99)
Glucose-Capillary: 144 mg/dL — ABNORMAL HIGH (ref 70–99)
Glucose-Capillary: 131 mg/dL — ABNORMAL HIGH (ref 70–99)
Glucose-Capillary: 116 mg/dL — ABNORMAL HIGH (ref 70–99)
Glucose-Capillary: 106 mg/dL — ABNORMAL HIGH (ref 70–99)
Glucose-Capillary: 106 mg/dL — ABNORMAL HIGH (ref 70–99)
Glucose-Capillary: 174 mg/dL — ABNORMAL HIGH (ref 70–99)
Glucose-Capillary: 155 mg/dL — ABNORMAL HIGH (ref 70–99)
Glucose-Capillary: 175 mg/dL — ABNORMAL HIGH (ref 70–99)

## 2014-02-03 LAB — CBC
HCT: 33.1 % — ABNORMAL LOW (ref 36.0–46.0)
HEMOGLOBIN: 11 g/dL — AB (ref 12.0–15.0)
MCH: 30.7 pg (ref 26.0–34.0)
MCHC: 33.2 g/dL (ref 30.0–36.0)
MCV: 92.5 fL (ref 78.0–100.0)
Platelets: 204 10*3/uL (ref 150–400)
RBC: 3.58 MIL/uL — ABNORMAL LOW (ref 3.87–5.11)
RDW: 13.6 % (ref 11.5–15.5)
WBC: 10.1 10*3/uL (ref 4.0–10.5)

## 2014-02-03 MED ORDER — SODIUM CHLORIDE 0.9 % IV SOLN
INTRAVENOUS | Status: DC
  Administered 2014-02-03: 1 [IU]/h via INTRAVENOUS
  Filled 2014-02-03 (×2): qty 1

## 2014-02-03 MED ORDER — CITRIC ACID-SODIUM CITRATE 334-500 MG/5ML PO SOLN
30.0000 mL | Freq: Once | ORAL | Status: AC
  Administered 2014-02-03: 30 mL via ORAL
  Filled 2014-02-03: qty 15

## 2014-02-03 MED ORDER — DEXTROSE 50 % IV SOLN: 25.0000 mL | INTRAVENOUS | Status: DC | PRN

## 2014-02-03 MED ORDER — INSULIN REGULAR HUMAN 100 UNIT/ML IJ SOLN: 10.0000 [IU] | Freq: Every day | INTRAMUSCULAR | Status: DC

## 2014-02-03 MED ORDER — DEXTROSE-NACL 5-0.45 % IV SOLN
INTRAVENOUS | Status: DC
  Administered 2014-02-03: 19:00:00 via INTRAVENOUS

## 2014-02-03 MED ORDER — INSULIN NPH (HUMAN) (ISOPHANE) 100 UNIT/ML ~~LOC~~ SUSP
20.0000 [IU] | Freq: Every day | SUBCUTANEOUS | Status: DC
  Administered 2014-02-03: 20 [IU] via SUBCUTANEOUS

## 2014-02-03 MED ORDER — LACTATED RINGERS IV SOLN
INTRAVENOUS | Status: DC
  Administered 2014-02-03 – 2014-02-05 (×5): via INTRAVENOUS

## 2014-02-03 MED ORDER — ONDANSETRON HCL 4 MG/2ML IJ SOLN
4.0000 mg | Freq: Three times a day (TID) | INTRAMUSCULAR | Status: DC | PRN
  Administered 2014-02-03: 4 mg via INTRAVENOUS
  Filled 2014-02-03: qty 2

## 2014-02-03 MED ORDER — INSULIN REGULAR BOLUS VIA INFUSION
0.0000 [IU] | Freq: Three times a day (TID) | INTRAVENOUS | Status: DC
  Administered 2014-02-04: 3 [IU] via INTRAVENOUS
  Filled 2014-02-03: qty 10

## 2014-02-03 MED ORDER — SODIUM CHLORIDE 0.45 % IV SOLN: INTRAVENOUS | Status: DC

## 2014-02-03 NOTE — Progress Notes (Signed)
IVF LR ordered

## 2014-02-03 NOTE — Progress Notes (Signed)
Called by MFM Dr. Leonides Sake to discuss BPP of 8/8, also showing some increased fluid with largest pocket approx 9cm and some echogenicity to fluid.  Dr. Leonides Sake would like to repeat transvaginal US to confirm no previa ( prior US states resolved).  We discussed low threshold for C/S, if any further prolonged decels she recommends c/s.  AM CBC pending.  FHT 140 10x10s. TOCO q3-10.  SVE deferred.

## 2014-02-03 NOTE — Progress Notes (Signed)
Pt feeling stronger and more frequent contractions.  FHT; prolonged late deceleration this morning after commode with return to baseline.  Min to mod variability, some 10x10s, one 15x15, no further decelerations.   TOCO: q1-88min SVE; deferred A/P: suspected placental abruption with uncontroled GDMA2 1) BPP ordered given Cat 2 strip.  Discussed with MFM, given prolonged late decelerations with return to baseline, no bleeding, no indication for delivery at this time.  Discussed with pt's RN, pt may rupture membranes or go into labor.  If contractions worsening, and no bleeding, gently check cervix to assess for change.  If SROM, check cervix.  If bleeding develops/NRFHT will proceed to c/s.  If no bleeding and SROM or labor can move to L&D. 2) Pt received NPH this morning, but will remain NPO, will check BS q2hrs and restart drip as needed.

## 2014-02-04 LAB — GLUCOSE, CAPILLARY
GLUCOSE-CAPILLARY: 102 mg/dL — AB (ref 70–99)
Glucose-Capillary: 88 mg/dL (ref 70–99)
Glucose-Capillary: 90 mg/dL (ref 70–99)
Glucose-Capillary: 137 mg/dL — ABNORMAL HIGH (ref 70–99)
Glucose-Capillary: 160 mg/dL — ABNORMAL HIGH (ref 70–99)
Glucose-Capillary: 103 mg/dL — ABNORMAL HIGH (ref 70–99)
Glucose-Capillary: 108 mg/dL — ABNORMAL HIGH (ref 70–99)
Glucose-Capillary: 110 mg/dL — ABNORMAL HIGH (ref 70–99)
Glucose-Capillary: 120 mg/dL — ABNORMAL HIGH (ref 70–99)
Glucose-Capillary: 129 mg/dL — ABNORMAL HIGH (ref 70–99)
Glucose-Capillary: 137 mg/dL — ABNORMAL HIGH (ref 70–99)
Glucose-Capillary: 155 mg/dL — ABNORMAL HIGH (ref 70–99)
Glucose-Capillary: 90 mg/dL (ref 70–99)
Glucose-Capillary: 95 mg/dL (ref 70–99)
Glucose-Capillary: 143 mg/dL — ABNORMAL HIGH (ref 70–99)
Glucose-Capillary: 136 mg/dL — ABNORMAL HIGH (ref 70–99)

## 2014-02-04 MED ORDER — INSULIN ASPART 100 UNIT/ML ~~LOC~~ SOLN
0.0000 [IU] | Freq: Four times a day (QID) | SUBCUTANEOUS | Status: DC
  Administered 2014-02-04 – 2014-02-05 (×6): 4 [IU] via SUBCUTANEOUS
  Administered 2014-02-05: 3 [IU] via SUBCUTANEOUS
  Administered 2014-02-06: 4 [IU] via SUBCUTANEOUS
  Administered 2014-02-06: 3 [IU] via SUBCUTANEOUS
  Administered 2014-02-06: 4 [IU] via SUBCUTANEOUS

## 2014-02-04 MED ORDER — INSULIN ASPART 100 UNIT/ML ~~LOC~~ SOLN
0.0000 [IU] | Freq: Three times a day (TID) | SUBCUTANEOUS | Status: DC
  Administered 2014-02-04: 6 [IU] via SUBCUTANEOUS
  Administered 2014-02-04: 7 [IU] via SUBCUTANEOUS
  Administered 2014-02-05: 8 [IU] via SUBCUTANEOUS
  Administered 2014-02-05: 4 [IU] via SUBCUTANEOUS
  Administered 2014-02-05: 6 [IU] via SUBCUTANEOUS

## 2014-02-04 MED ORDER — INSULIN NPH (HUMAN) (ISOPHANE) 100 UNIT/ML ~~LOC~~ SUSP
15.0000 [IU] | Freq: Two times a day (BID) | SUBCUTANEOUS | Status: DC
  Administered 2014-02-04 (×2): 15 [IU] via SUBCUTANEOUS
  Filled 2014-02-04: qty 10

## 2014-02-04 NOTE — Progress Notes (Signed)
Pt without complaints. Not having any decels or bleeding. Put on carb mod diet today. Insulin pump also discontinued. Now on Insulin regimen. Cx was 1.8cm on u/s with some funneling. Not having ctxs Plan/ Will watch on monitor for decels           Will follow BSs on new dosing.           Would like to discharge to home if no bleeding or decels.

## 2014-02-04 NOTE — Progress Notes (Signed)
Inpatient Diabetes Program Recommendations  AACE/ADA: New Consensus Statement on Inpatient Glycemic Control (2013)  Target Ranges:  Prepandial:   less than 140 mg/dL      Peak postprandial:   less than 180 mg/dL (1-2 hours)      Critically ill patients:  140 - 180 mg/dL   Results for Melanie [name], Melanie [name] (MRN [id number]) as of 02/04/2014 09:15  Ref. Range 02/03/2014 13:02 02/03/2014 15:01 02/03/2014 17:20 02/03/2014 18:42 02/03/2014 19:41 02/03/2014 20:46 02/03/2014 21:50 02/04/2014 06:53 02/04/2014 08:06 02/04/2014 09:10  Glucose-Capillary Latest Range: 70-99 mg/dL 98 100 (H) 174 (H) 155 (H) 142 (H) 164 (H) 175 (H) 102 (H) 88 90   Diabetes history: GDM Outpatient Diabetes medications: NPH 10 units QAM before breakfast, Novolin R 20 units before breakfast Current orders for Inpatient glycemic control: Novolin R insulin drip  Inpatient Diabetes Program Recommendations Insulin - Basal: At time of transition from IV insulin drip to SQ insulin, please consider ordering NPH 15 units BID (NPH to be given 1-2 hours before insulin drip is stopped). Correction (SSI): At time of transition from IV insulin drip to SQ insulin, please consider ordering correction based on total daily dose 74 units which would be  Novolog 0-24 units QID (fasting and 2 hour post prandial). Insulin - Meal Coverage: At time of transition from IV insulin drip to SQ insulin, please consider ordering Novolog 0-15 units TID with meals (1 unit for every 7 grams of carbohydrates).  Thanks, Barnie Alderman, RN, MSN, CCRN Diabetes Coordinator Inpatient Diabetes Program [phone] (Team Pager) [phone] ([facility] office) [phone] [facility] office)

## 2014-02-04 NOTE — Progress Notes (Signed)
Ur chart review completed.  

## 2014-02-04 NOTE — Progress Notes (Signed)
Cassie,RN took blood sugar at 0551=108

## 2014-02-05 LAB — GLUCOSE, CAPILLARY
GLUCOSE-CAPILLARY: 160 mg/dL — AB (ref 70–99)
GLUCOSE-CAPILLARY: 115 mg/dL — AB (ref 70–99)
GLUCOSE-CAPILLARY: 154 mg/dL — AB (ref 70–99)
Glucose-Capillary: 132 mg/dL — ABNORMAL HIGH (ref 70–99)

## 2014-02-05 LAB — TYPE AND SCREEN
ABO/RH(D): O POS
Antibody Screen: NEGATIVE

## 2014-02-05 MED ORDER — MAGNESIUM HYDROXIDE 400 MG/5ML PO SUSP
30.0000 mL | Freq: Every day | ORAL | Status: DC | PRN
  Administered 2014-02-05 – 2014-02-06 (×2): 30 mL via ORAL
  Filled 2014-02-05 (×2): qty 30

## 2014-02-05 MED ORDER — INSULIN NPH (HUMAN) (ISOPHANE) 100 UNIT/ML ~~LOC~~ SUSP
16.0000 [IU] | Freq: Two times a day (BID) | SUBCUTANEOUS | Status: DC
  Administered 2014-02-05 – 2014-02-06 (×3): 16 [IU] via SUBCUTANEOUS
  Filled 2014-02-05: qty 10

## 2014-02-05 NOTE — Progress Notes (Signed)
Spoke with Dr Philis Pique, We will continue CBG's and insulin regiment one more day then possible discharge.

## 2014-02-05 NOTE — Progress Notes (Signed)
28 y.o. [id number] [redacted]w[redacted]d HD#6 admitted for 32WKS, UNCONTROLLED DIABETIES.  Pt currently stable with no c/o.  Good FM.  @VITALS2 @  Lungs CTA Cor RRR Abd  Soft, gravid, nontender Ex SCDs FHTs  This am- 120s, good short term variability, NST R Toco  q 20  Results for orders placed during the hospital encounter of 01/30/14 (from the past 24 hour(s))  GLUCOSE, CAPILLARY     Status: None   Collection Time    02/04/14  8:06 AM      Result Value Ref Range   Glucose-Capillary 88  70 - 99 mg/dL  GLUCOSE, CAPILLARY     Status: None   Collection Time    02/04/14  9:10 AM      Result Value Ref Range   Glucose-Capillary 90  70 - 99 mg/dL  GLUCOSE, CAPILLARY     Status: Abnormal   Collection Time    02/04/14 10:11 AM      Result Value Ref Range   Glucose-Capillary 160 (*) 70 - 99 mg/dL  GLUCOSE, CAPILLARY     Status: Abnormal   Collection Time    02/04/14 11:15 AM      Result Value Ref Range   Glucose-Capillary 137 (*) 70 - 99 mg/dL  GLUCOSE, CAPILLARY     Status: Abnormal   Collection Time    02/04/14  3:06 PM      Result Value Ref Range   Glucose-Capillary 155 (*) 70 - 99 mg/dL  GLUCOSE, CAPILLARY     Status: Abnormal   Collection Time    02/04/14  7:34 PM      Result Value Ref Range   Glucose-Capillary 143 (*) 70 - 99 mg/dL  GLUCOSE, CAPILLARY     Status: Abnormal   Collection Time    02/04/14  9:57 PM      Result Value Ref Range   Glucose-Capillary 136 (*) 70 - 99 mg/dL  GLUCOSE, CAPILLARY     Status: Abnormal   Collection Time    02/05/14  5:37 AM      Result Value Ref Range   Glucose-Capillary 132 (*) 70 - 99 mg/dL    A/P:  HD#6  [redacted]w[redacted]d with uncontrolled GDM and possible abruption.  Cervix only 1.8 last check.  Sugars ok fasting but still elevated after meals.  DM Coordinator rec the following: Inpatient Diabetes Program Recommendations  Insulin - Basal: Please consider increasing NPH to 16 units BID.  Insulin - Meal Coverage: Please consider changing Novolog  carbohydrate coverage to 1:5 (1 unit for every 5 grams of carbohydrates).    Will do as above and follow for bleeding or worsening of contractions.     Melanie [name] A

## 2014-02-05 NOTE — Progress Notes (Signed)
Just changed insulin regimen today.  First pp was 160, second 115.  I held d/c earlier  but just called to see if we had another pp and if it was good, I planned to d/c pt.  Talked to the nurse- it's now 7:30 and pt has not yet wanted to eat dinner.  The nurse also told me that she is not giving herself her insulin shots and does not seem ready to do it at home.  I asked the nurse to go over with her how to do SQ injections over then next doses of insulin and encourage the pt to eat on a regular schedule while she is needing so much insulin.  Nurse is willing to do so.  Will not d/c tonight.

## 2014-02-05 NOTE — Progress Notes (Signed)
Inpatient Diabetes Program Recommendations  Results for BRIDGITTE, FELICETTI (MRN [id number]) as of 02/05/2014 07:47  Ref. Range 02/04/2014 08:06 02/04/2014 09:10 02/04/2014 10:11 02/04/2014 11:15 02/04/2014 15:06 02/04/2014 19:34 02/04/2014 21:57 02/05/2014 05:37  Glucose-Capillary Latest Range: 70-99 mg/dL 88 90 160 (H) 137 (H) 155 (H) 143 (H) 136 (H) 132 (H)    Diabetes history: GDM  Outpatient Diabetes medications: NPH 10 units QAM before breakfast, Novolin R 20 units before breakfast  Current orders for Inpatient glycemic control: NPH 15 units BID, Novolog 0-15 units TID (1 unit for every 7 grams of carbs), Novolog 0-24 units QID (fasting, 2 hour post prandial)  Inpatient Diabetes Program Recommendations Insulin - Basal: Please consider increasing NPH to 16 units BID. Insulin - Meal Coverage: Please consider changing Novolog carbohydrate coverage to 1:5 (1 unit for every 5 grams of carbohydrates).  Thanks, Barnie Alderman, RN, MSN, CCRN Diabetes Coordinator Inpatient Diabetes Program [phone] (Team Pager) [phone] ([facility] office) [phone] [facility] office)

## 2014-02-06 LAB — GLUCOSE, CAPILLARY
GLUCOSE-CAPILLARY: 144 mg/dL — AB (ref 70–99)
Glucose-Capillary: 93 mg/dL (ref 70–99)
Glucose-Capillary: 119 mg/dL — ABNORMAL HIGH (ref 70–99)
Glucose-Capillary: 148 mg/dL — ABNORMAL HIGH (ref 70–99)

## 2014-02-06 MED ORDER — INSULIN REGULAR HUMAN 100 UNIT/ML IJ SOLN
4.0000 [IU] | Freq: Every day | INTRAMUSCULAR | Status: DC
  Administered 2014-02-06 – 2014-02-07 (×2): 4 [IU] via SUBCUTANEOUS
  Filled 2014-02-06: qty 0.04

## 2014-02-06 MED ORDER — INSULIN NPH (HUMAN) (ISOPHANE) 100 UNIT/ML ~~LOC~~ SUSP
20.0000 [IU] | Freq: Two times a day (BID) | SUBCUTANEOUS | Status: DC
  Administered 2014-02-06: 20 [IU] via SUBCUTANEOUS
  Administered 2014-02-06: 4 [IU] via SUBCUTANEOUS
  Administered 2014-02-07: 20 [IU] via SUBCUTANEOUS

## 2014-02-06 NOTE — Progress Notes (Signed)
Pt now able to demonstrate self injection of insulin using preferred sites of thighs and arm.  Pt requires reinforcement on drawing up correct amount of insulin and Carb counting.  Pt requires more teaching in calculating the amount of insulin to carb ratio for meal coverage.  Pt not taught how to draw up in one syringe reg and nph insulin as this is overwhelming.  Pt appears comfortable in drawing up separate and giving injections.  Pt says that the  Insulin she has at home is different.  Both were clear.  Instruct pt to discard whatever she had at home and use meds that she will be discharged to home with only.

## 2014-02-06 NOTE — Progress Notes (Signed)
Name: SKYLA CHAMPAGNE Medical Record Number:  [id number] Date of Birth: Jun 27, 1986 Date of Service: 02/06/2014  28 y.o. [id number] [redacted]w[redacted]d HD#7 admitted for 32WKS, UNCONTROLLED DIABETIES and possible chronic abruption.  Pt currently stable with no c/o. She denies contractions, no vaginal bleeding, no leaking of fluid. Reports good FM.  The patient's past medical history and prenatal records were reviewed.  Additional issues addressed and updated today: Patient Active Problem List   Diagnosis Date Noted  . Gestational diabetes mellitus, class A2 01/30/2014  . 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 Antepartum bleeding, second trimester 10/15/2013  . Acute pericarditis, unspecified 09/12/2013  . 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 Nausea vomiting and diarrhea 06/15/2012  . Dehydration 06/15/2012  . Acute colitis 06/15/2012  . Hypokalemia 06/15/2012  . Preterm premature rupture of membranes 04/16/2011  . Gestational diabetes 04/16/2011  . SVD (spontaneous vaginal delivery) 04/16/2011   Family History  Problem Relation Age of Onset  . Hypertension Mother   . Heart Problems Mother     tumor, open heart surgery  . Miscarriages / [place] Mother   . Heart Problems Maternal Aunt     tumor  . Diabetes Father   . Alcohol abuse Father   . Diabetes Paternal Grandmother   . Diabetes Maternal Grandmother    History   Social History  . Marital Status: Single    Spouse Name: N/A    Number of Children: 3  . Years of Education: N/A   Social History Main Topics  . Smoking status: Never Smoker   . Smokeless tobacco: Never Used  . Alcohol Use: Yes     Comment: occasionally. NONE SINCE FOUND OUT WAS PREG  . Drug Use: 1.00 per week    Special: Marijuana     Comment: states she smoked 2 weeks ago when she was overly stressed, none since then.LAST USED- 2 WEEKS AGO  . Sexual Activity: Yes    Birth Control/ Protection: None   Other Topics Concern   . None   Social History Narrative  . None   Filed Vitals:   02/06/14 0754  BP: 127/74  Pulse: 59  Temp: 98.5 F (36.9 C)  Resp: 18     Physical Examination:   Filed Vitals:   02/06/14 0754  BP: 127/74  Pulse: 59  Temp: 98.5 F (36.9 C)  Resp: 18   General appearance - alert, well appearing, and in no distress Lungs CTA Cor RRR Abd  Soft, gravid, nontender Ex SCDs FHTs  135s, moderate variability accels no decels Toco  none  Cervix: not evaluated  Results for orders placed during the hospital encounter of 01/30/14 (from the past 24 hour(s))  GLUCOSE, CAPILLARY     Status: Abnormal   Collection Time    02/05/14 10:55 AM      Result Value Ref Range   Glucose-Capillary 160 (*) 70 - 99 mg/dL  GLUCOSE, CAPILLARY     Status: Abnormal   Collection Time    02/05/14  4:17 PM      Result Value Ref Range   Glucose-Capillary 115 (*) 70 - 99 mg/dL  TYPE AND SCREEN     Status: None   Collection Time    02/05/14  6:44 PM      Result Value Ref Range   ABO/RH(D) O POS     Antibody Screen NEG     Sample Expiration 02/08/2014    GLUCOSE, CAPILLARY     Status: Abnormal  Collection Time    02/05/14  9:49 PM      Result Value Ref Range   Glucose-Capillary 154 (*) 70 - 99 mg/dL  GLUCOSE, CAPILLARY     Status: None   Collection Time    02/06/14  6:56 AM      Result Value Ref Range   Glucose-Capillary 93  70 - 99 mg/dL    A:  HD#7  [redacted]w[redacted]d with A2 GDM possible chronic abruption no recent episodes of bleeding.  NST Category I.   P: Will discontinue insulin management according to carb load for goal of getting split dose regimen Patient can go home with.  Her fasting and post prandial breakfast BS are both elevated so will increase NPH to 20 units QAC and QHS.  Her post prandial dinner is also elevated so will add scheduled Regular dose of Insulin 4 Units at dinner time. Will watch her BS for today for goal of discharge tomorrow or Friday.  Jaedan Huttner STACIA

## 2014-02-07 ENCOUNTER — Ambulatory Visit: Payer: Medicaid Other | Admitting: Cardiology

## 2014-02-07 LAB — GLUCOSE, CAPILLARY
Glucose-Capillary: 124 mg/dL — ABNORMAL HIGH (ref 70–99)
Glucose-Capillary: 126 mg/dL — ABNORMAL HIGH (ref 70–99)
Glucose-Capillary: 132 mg/dL — ABNORMAL HIGH (ref 70–99)

## 2014-02-07 MED ORDER — INSULIN NPH (HUMAN) (ISOPHANE) 100 UNIT/ML ~~LOC~~ SUSP: 20.0000 [IU] | Freq: Every day | SUBCUTANEOUS | Status: DC

## 2014-02-07 MED ORDER — INSULIN NPH (HUMAN) (ISOPHANE) 100 UNIT/ML ~~LOC~~ SUSP: 24.0000 [IU] | Freq: Every day | SUBCUTANEOUS | Status: DC

## 2014-02-07 MED ORDER — INSULIN REGULAR HUMAN 100 UNIT/ML IJ SOLN: 4.0000 [IU] | Freq: Every day | INTRAMUSCULAR | Status: DC

## 2014-02-07 NOTE — Discharge Instructions (Signed)

## 2014-02-07 NOTE — Progress Notes (Signed)
CSW received consult for assistance with medication.  CSW informed RN that if assistance is available, Case Management is the department that will evaluate.  CSW is screening out referral at this time.

## 2014-02-07 NOTE — Progress Notes (Signed)
Observed pt x2 this am draw up insulin and self administer. Pt preformed task correctly.

## 2014-02-12 NOTE — Discharge Summary (Signed)
Obstetric Discharge Summary Reason for Admission: observation/evaluation Prenatal Procedures: NST and ultrasound, insulin drip, glucoregulation Intrapartum Procedures: None, remains pregnant Postpartum Procedures: NA, remains pregnant Complications-Operative and Postpartum: NA remains pregnant Hemoglobin  Date Value Ref Range Status  02/03/2014 11.0* 12.0 - 15.0 g/dL Final     HCT  Date Value Ref Range Status  02/03/2014 33.1* 36.0 - 46.0 % Final    Physical Exam:  General: alert, cooperative and appears stated age FHR 140 reactive, class 1 tracing  Discharge Diagnoses: Antepartum bleeding, poorly controlled gestational diabetic  Discharge Information: Date: 02/12/2014 Activity: unrestricted Diet: Diabetic diet Medications: NPH Insulin 20 units with breakfast & 24 units at bedtime. Regular insulin 4 units with breakfast and 4 units with dinner Condition: stable Instructions: Continue checking blood sugars fasting and 2 hours post-prandial Discharge to: home Follow-up Information   Follow up with Marcial Pacas., MD On 02/11/2014. (@1 :15)    Specialty:  Obstetrics and Gynecology   Contact information:   [address] [place] [address] [place]. 02/12/2014, 5:52 PM

## 2014-02-14 ENCOUNTER — Encounter (HOSPITAL_COMMUNITY): Payer: Self-pay | Admitting: *Deleted

## 2014-02-14 ENCOUNTER — Inpatient Hospital Stay (HOSPITAL_COMMUNITY)
Admission: AD | Admit: 2014-02-14 | Discharge: 2014-02-17 | DRG: 767 | Disposition: A | Discharge status: Home / Self Care | Payer: Medicaid Other | Source: Ambulatory Visit | Attending: Obstetrics and Gynecology | Admitting: Obstetrics and Gynecology

## 2014-02-14 DIAGNOSIS — O99814 Abnormal glucose complicating childbirth: Secondary | ICD-10-CM | POA: Diagnosis present

## 2014-02-14 DIAGNOSIS — Z22330 Carrier of Group B streptococcus: Secondary | ICD-10-CM | POA: Diagnosis not present

## 2014-02-14 DIAGNOSIS — O99892 Other specified diseases and conditions complicating childbirth: Secondary | ICD-10-CM | POA: Diagnosis present

## 2014-02-14 DIAGNOSIS — O99810 Abnormal glucose complicating pregnancy: Secondary | ICD-10-CM | POA: Diagnosis present

## 2014-02-14 DIAGNOSIS — O9989 Other specified diseases and conditions complicating pregnancy, childbirth and the puerperium: Secondary | ICD-10-CM

## 2014-02-14 DIAGNOSIS — Z3480 Encounter for supervision of other normal pregnancy, unspecified trimester: Secondary | ICD-10-CM

## 2014-02-14 DIAGNOSIS — O24414 Gestational diabetes mellitus in pregnancy, insulin controlled: Secondary | ICD-10-CM

## 2014-02-14 DIAGNOSIS — Z302 Encounter for sterilization: Secondary | ICD-10-CM

## 2014-02-14 DIAGNOSIS — O42919 Preterm premature rupture of membranes, unspecified as to length of time between rupture and onset of labor, unspecified trimester: Secondary | ICD-10-CM

## 2014-02-14 DIAGNOSIS — Z3490 Encounter for supervision of normal pregnancy, unspecified, unspecified trimester: Secondary | ICD-10-CM

## 2014-02-14 LAB — CBC
HEMATOCRIT: 41 % (ref 36.0–46.0)
Hemoglobin: 13.3 g/dL (ref 12.0–15.0)
MCH: 30.6 pg (ref 26.0–34.0)
MCHC: 32.4 g/dL (ref 30.0–36.0)
MCV: 94.5 fL (ref 78.0–100.0)
PLATELETS: 262 10*3/uL (ref 150–400)
RBC: 4.34 MIL/uL (ref 3.87–5.11)
RDW: 14.7 % (ref 11.5–15.5)
WBC: 14.6 10*3/uL — AB (ref 4.0–10.5)

## 2014-02-14 LAB — GLUCOSE, CAPILLARY: Glucose-Capillary: 185 mg/dL — ABNORMAL HIGH (ref 70–99)

## 2014-02-14 MED ORDER — LACTATED RINGERS IV SOLN: INTRAVENOUS | Status: DC

## 2014-02-14 MED ORDER — OXYCODONE-ACETAMINOPHEN 5-325 MG PO TABS
1.0000 | ORAL_TABLET | ORAL | Status: DC | PRN
  Administered 2014-02-15: 1 via ORAL
  Administered 2014-02-16 (×3): 2 via ORAL
  Administered 2014-02-16: 1 via ORAL
  Administered 2014-02-17: 2 via ORAL
  Filled 2014-02-14: qty 1
  Filled 2014-02-14 (×2): qty 2
  Filled 2014-02-14: qty 1
  Filled 2014-02-14: qty 2
  Filled 2014-02-14 (×2): qty 1

## 2014-02-14 MED ORDER — MEASLES, MUMPS & RUBELLA VAC ~~LOC~~ INJ
0.5000 mL | INJECTION | Freq: Once | SUBCUTANEOUS | Status: DC
  Filled 2014-02-14: qty 0.5

## 2014-02-14 MED ORDER — LACTATED RINGERS IV SOLN: 500.0000 mL | Freq: Once | INTRAVENOUS | Status: DC

## 2014-02-14 MED ORDER — EPHEDRINE 5 MG/ML INJ
10.0000 mg | INTRAVENOUS | Status: DC | PRN
  Filled 2014-02-14: qty 2

## 2014-02-14 MED ORDER — IBUPROFEN 600 MG PO TABS
600.0000 mg | ORAL_TABLET | Freq: Four times a day (QID) | ORAL | Status: DC
Start: 2014-02-14 — End: 2014-02-17
  Administered 2014-02-14 – 2014-02-17 (×10): 600 mg via ORAL
  Filled 2014-02-14 (×10): qty 1

## 2014-02-14 MED ORDER — FENTANYL 2.5 MCG/ML BUPIVACAINE 1/10 % EPIDURAL INFUSION (WH - ANES)
14.0000 mL/h | INTRAMUSCULAR | Status: DC | PRN
  Filled 2014-02-14: qty 125

## 2014-02-14 MED ORDER — PENICILLIN G POTASSIUM 5000000 UNITS IJ SOLR
5.0000 10*6.[IU] | Freq: Once | INTRAVENOUS | Status: DC
  Filled 2014-02-14: qty 5

## 2014-02-14 MED ORDER — SENNOSIDES-DOCUSATE SODIUM 8.6-50 MG PO TABS
2.0000 | ORAL_TABLET | ORAL | Status: DC
  Administered 2014-02-14 – 2014-02-17 (×3): 2 via ORAL
  Filled 2014-02-14 (×3): qty 2

## 2014-02-14 MED ORDER — ZOLPIDEM TARTRATE 5 MG PO TABS: 5.0000 mg | ORAL_TABLET | Freq: Every evening | ORAL | Status: DC | PRN

## 2014-02-14 MED ORDER — PENICILLIN G POTASSIUM 5000000 UNITS IJ SOLR
2.5000 10*6.[IU] | INTRAVENOUS | Status: DC
  Filled 2014-02-14 (×3): qty 2.5

## 2014-02-14 MED ORDER — CITRIC ACID-SODIUM CITRATE 334-500 MG/5ML PO SOLN: 30.0000 mL | ORAL | Status: DC | PRN

## 2014-02-14 MED ORDER — OXYTOCIN 40 UNITS IN LACTATED RINGERS INFUSION - SIMPLE MED
62.5000 mL/h | INTRAVENOUS | Status: DC
  Filled 2014-02-14: qty 1000

## 2014-02-14 MED ORDER — LACTATED RINGERS IV SOLN: 500.0000 mL | INTRAVENOUS | Status: DC | PRN

## 2014-02-14 MED ORDER — PHENYLEPHRINE 40 MCG/ML (10ML) SYRINGE FOR IV PUSH (FOR BLOOD PRESSURE SUPPORT)
80.0000 ug | PREFILLED_SYRINGE | INTRAVENOUS | Status: DC | PRN
  Filled 2014-02-14: qty 2
  Filled 2014-02-14: qty 10

## 2014-02-14 MED ORDER — ONDANSETRON HCL 4 MG/2ML IJ SOLN: 4.0000 mg | Freq: Four times a day (QID) | INTRAMUSCULAR | Status: DC | PRN

## 2014-02-14 MED ORDER — SODIUM CHLORIDE 0.9 % IV SOLN
2.0000 g | Freq: Once | INTRAVENOUS | Status: AC
  Administered 2014-02-14: 2 g via INTRAVENOUS
  Filled 2014-02-14: qty 2000

## 2014-02-14 MED ORDER — OXYCODONE-ACETAMINOPHEN 5-325 MG PO TABS
1.0000 | ORAL_TABLET | ORAL | Status: DC | PRN
  Administered 2014-02-14: 1 via ORAL
  Filled 2014-02-14: qty 1

## 2014-02-14 MED ORDER — SIMETHICONE 80 MG PO CHEW: 80.0000 mg | CHEWABLE_TABLET | ORAL | Status: DC | PRN

## 2014-02-14 MED ORDER — ONDANSETRON HCL 4 MG PO TABS: 4.0000 mg | ORAL_TABLET | ORAL | Status: DC | PRN

## 2014-02-14 MED ORDER — TETANUS-DIPHTH-ACELL PERTUSSIS 5-2.5-18.5 LF-MCG/0.5 IM SUSP: 0.5000 mL | Freq: Once | INTRAMUSCULAR | Status: DC

## 2014-02-14 MED ORDER — ACETAMINOPHEN 325 MG PO TABS: 650.0000 mg | ORAL_TABLET | ORAL | Status: DC | PRN

## 2014-02-14 MED ORDER — METOCLOPRAMIDE HCL 10 MG PO TABS
10.0000 mg | ORAL_TABLET | Freq: Once | ORAL | Status: AC
  Administered 2014-02-15: 10 mg via ORAL
  Filled 2014-02-14: qty 1

## 2014-02-14 MED ORDER — LACTATED RINGERS IV SOLN
INTRAVENOUS | Status: DC
  Administered 2014-02-15 (×2): via INTRAVENOUS

## 2014-02-14 MED ORDER — ONDANSETRON HCL 4 MG/2ML IJ SOLN
4.0000 mg | INTRAMUSCULAR | Status: DC | PRN
Start: 2014-02-14 — End: 2014-02-17

## 2014-02-14 MED ORDER — IBUPROFEN 600 MG PO TABS
600.0000 mg | ORAL_TABLET | Freq: Four times a day (QID) | ORAL | Status: DC | PRN
  Administered 2014-02-14: 600 mg via ORAL
  Filled 2014-02-14: qty 1

## 2014-02-14 MED ORDER — OXYTOCIN BOLUS FROM INFUSION
500.0000 mL | INTRAVENOUS | Status: DC
  Administered 2014-02-14: 500 mL via INTRAVENOUS

## 2014-02-14 MED ORDER — DIBUCAINE 1 % RE OINT: 1.0000 "application " | TOPICAL_OINTMENT | RECTAL | Status: DC | PRN

## 2014-02-14 MED ORDER — LIDOCAINE HCL (PF) 1 % IJ SOLN
30.0000 mL | INTRAMUSCULAR | Status: DC | PRN
  Filled 2014-02-14: qty 30

## 2014-02-14 MED ORDER — BENZOCAINE-MENTHOL 20-0.5 % EX AERO
1.0000 "application " | INHALATION_SPRAY | CUTANEOUS | Status: DC | PRN
  Filled 2014-02-14: qty 56

## 2014-02-14 MED ORDER — DIPHENHYDRAMINE HCL 50 MG/ML IJ SOLN: 12.5000 mg | INTRAMUSCULAR | Status: DC | PRN

## 2014-02-14 MED ORDER — FAMOTIDINE 20 MG PO TABS
40.0000 mg | ORAL_TABLET | Freq: Once | ORAL | Status: AC
  Administered 2014-02-15: 40 mg via ORAL
  Filled 2014-02-14: qty 2

## 2014-02-14 MED ORDER — FLEET ENEMA 7-19 GM/118ML RE ENEM: 1.0000 | ENEMA | RECTAL | Status: DC | PRN

## 2014-02-14 MED ORDER — WITCH HAZEL-GLYCERIN EX PADS: 1.0000 "application " | MEDICATED_PAD | CUTANEOUS | Status: DC | PRN

## 2014-02-14 MED ORDER — PHENYLEPHRINE 40 MCG/ML (10ML) SYRINGE FOR IV PUSH (FOR BLOOD PRESSURE SUPPORT)
80.0000 ug | PREFILLED_SYRINGE | INTRAVENOUS | Status: DC | PRN
  Filled 2014-02-14: qty 2

## 2014-02-14 NOTE — H&P (Signed)
Pt is a 28 y/o Black female, G4P2103 at 35 weeks with poorly controlled A2GDM. Pt was seen today in the office and was contracting q 3 min. At the time she was 3-4 cm.. She did not start her insulin until 31 weeks. U/S was wnl. PMHX: see hollister. PE: VSSAF        HEENT-wnl        ABD- gravid,palp ctx.        FHTs, - no decels minimal reactivity. IMP/ IUP at 35 weeks         Poorly controlled, A2GDM         +GBS          Desires PP BTL- papers signed. Plan/ Admit           Expect SVD

## 2014-02-14 NOTE — Lactation Note (Signed)
This note was copied from the chart of Melanie [name]. Lactation Consultation Note  Patient Name: Melanie [name] Date: 02/14/2014 Reason for consult: Other (Comment) (charting for exclusion)   Maternal Data Formula Feeding for Exclusion: Yes Reason for exclusion: Mother's choice to formula feed on admision  Feeding Feeding Type: Formula Nipple Type: Slow - flow  LATCH Score/Interventions                      Lactation Tools Discussed/Used Tools: Bottle   Consult Status Consult Status: Complete    Bernita Buffy 02/14/2014, 10:20 PM

## 2014-02-14 NOTE — MAU Note (Signed)
Spoke to [facility], CCC/BS. Patient will be evaluated in room 170 in BS. Patient to 170 via w.c,

## 2014-02-15 ENCOUNTER — Encounter (HOSPITAL_COMMUNITY)
Admission: AD | Disposition: A | Discharge status: Home / Self Care | Payer: Self-pay | Source: Ambulatory Visit | Attending: Obstetrics and Gynecology

## 2014-02-15 ENCOUNTER — Encounter (HOSPITAL_COMMUNITY): Payer: Medicaid Other | Admitting: Anesthesiology

## 2014-02-15 ENCOUNTER — Encounter (HOSPITAL_COMMUNITY): Payer: Self-pay | Admitting: Anesthesiology

## 2014-02-15 ENCOUNTER — Inpatient Hospital Stay (HOSPITAL_COMMUNITY): Payer: Medicaid Other | Admitting: Anesthesiology

## 2014-02-15 HISTORY — PX: TUBAL LIGATION: SHX77

## 2014-02-15 LAB — RPR

## 2014-02-15 LAB — GLUCOSE, CAPILLARY
GLUCOSE-CAPILLARY: 125 mg/dL — AB (ref 70–99)
Glucose-Capillary: 169 mg/dL — ABNORMAL HIGH (ref 70–99)
Glucose-Capillary: 85 mg/dL (ref 70–99)

## 2014-02-15 LAB — CBC
HEMATOCRIT: 35.8 % — AB (ref 36.0–46.0)
Hemoglobin: 11.6 g/dL — ABNORMAL LOW (ref 12.0–15.0)
MCH: 30.4 pg (ref 26.0–34.0)
MCHC: 32.4 g/dL (ref 30.0–36.0)
MCV: 94 fL (ref 78.0–100.0)
PLATELETS: 229 10*3/uL (ref 150–400)
RBC: 3.81 MIL/uL — ABNORMAL LOW (ref 3.87–5.11)
RDW: 14.6 % (ref 11.5–15.5)
WBC: 20.2 10*3/uL — AB (ref 4.0–10.5)

## 2014-02-15 LAB — MRSA PCR SCREENING: MRSA BY PCR: NEGATIVE

## 2014-02-15 SURGERY — LIGATION, FALLOPIAN TUBE, POSTPARTUM
Anesthesia: General | Laterality: Bilateral

## 2014-02-15 MED ORDER — FENTANYL CITRATE 0.05 MG/ML IJ SOLN
INTRAMUSCULAR | Status: AC
  Filled 2014-02-15: qty 5

## 2014-02-15 MED ORDER — KETOROLAC TROMETHAMINE 30 MG/ML IJ SOLN: 15.0000 mg | Freq: Once | INTRAMUSCULAR | Status: AC | PRN

## 2014-02-15 MED ORDER — CITRIC ACID-SODIUM CITRATE 334-500 MG/5ML PO SOLN
ORAL | Status: AC
  Filled 2014-02-15: qty 15

## 2014-02-15 MED ORDER — MEPERIDINE HCL 25 MG/ML IJ SOLN: 6.2500 mg | INTRAMUSCULAR | Status: DC | PRN

## 2014-02-15 MED ORDER — SCOPOLAMINE 1 MG/3DAYS TD PT72
MEDICATED_PATCH | TRANSDERMAL | Status: DC | PRN
  Administered 2014-02-15: 1 via TRANSDERMAL

## 2014-02-15 MED ORDER — HYDROMORPHONE HCL PF 1 MG/ML IJ SOLN
INTRAMUSCULAR | Status: AC
  Administered 2014-02-15: 0.5 mg via INTRAVENOUS
  Filled 2014-02-15: qty 1

## 2014-02-15 MED ORDER — SUCCINYLCHOLINE CHLORIDE 20 MG/ML IJ SOLN
INTRAMUSCULAR | Status: AC
  Filled 2014-02-15: qty 10

## 2014-02-15 MED ORDER — NEOSTIGMINE METHYLSULFATE 10 MG/10ML IV SOLN
INTRAVENOUS | Status: DC | PRN
  Administered 2014-02-15: 3 mg via INTRAVENOUS

## 2014-02-15 MED ORDER — CITRIC ACID-SODIUM CITRATE 334-500 MG/5ML PO SOLN
ORAL | Status: DC | PRN
  Administered 2014-02-15: 30 mL via ORAL

## 2014-02-15 MED ORDER — HYDROMORPHONE HCL PF 1 MG/ML IJ SOLN
0.2500 mg | INTRAMUSCULAR | Status: DC | PRN
  Administered 2014-02-15 (×2): 0.5 mg via INTRAVENOUS

## 2014-02-15 MED ORDER — KETOROLAC TROMETHAMINE 30 MG/ML IJ SOLN
INTRAMUSCULAR | Status: DC | PRN
  Administered 2014-02-15: 30 mg via INTRAVENOUS

## 2014-02-15 MED ORDER — ROCURONIUM BROMIDE 100 MG/10ML IV SOLN
INTRAVENOUS | Status: DC | PRN
  Administered 2014-02-15: 20 mg via INTRAVENOUS

## 2014-02-15 MED ORDER — MIDAZOLAM HCL 2 MG/2ML IJ SOLN
INTRAMUSCULAR | Status: AC
  Filled 2014-02-15: qty 2

## 2014-02-15 MED ORDER — PROMETHAZINE HCL 25 MG/ML IJ SOLN: 6.2500 mg | INTRAMUSCULAR | Status: DC | PRN

## 2014-02-15 MED ORDER — ONDANSETRON HCL 4 MG/2ML IJ SOLN
INTRAMUSCULAR | Status: DC | PRN
  Administered 2014-02-15: 4 mg via INTRAVENOUS

## 2014-02-15 MED ORDER — LIDOCAINE HCL (CARDIAC) 20 MG/ML IV SOLN
INTRAVENOUS | Status: DC | PRN
  Administered 2014-02-15: 60 mg via INTRAVENOUS

## 2014-02-15 MED ORDER — 0.9 % SODIUM CHLORIDE (POUR BTL) OPTIME
TOPICAL | Status: DC | PRN
  Administered 2014-02-15: 1000 mL

## 2014-02-15 MED ORDER — GLYCOPYRROLATE 0.2 MG/ML IJ SOLN
INTRAMUSCULAR | Status: DC | PRN
  Administered 2014-02-15: .4 mg via INTRAVENOUS

## 2014-02-15 MED ORDER — MIDAZOLAM HCL 2 MG/2ML IJ SOLN
INTRAMUSCULAR | Status: DC | PRN
  Administered 2014-02-15: 2 mg via INTRAVENOUS

## 2014-02-15 MED ORDER — LIDOCAINE HCL (CARDIAC) 20 MG/ML IV SOLN
INTRAVENOUS | Status: AC
  Filled 2014-02-15: qty 5

## 2014-02-15 MED ORDER — FENTANYL CITRATE 0.05 MG/ML IJ SOLN
INTRAMUSCULAR | Status: DC | PRN
  Administered 2014-02-15 (×2): 100 ug via INTRAVENOUS
  Administered 2014-02-15: 50 ug via INTRAVENOUS

## 2014-02-15 MED ORDER — BUPIVACAINE HCL (PF) 0.25 % IJ SOLN
INTRAMUSCULAR | Status: DC | PRN
  Administered 2014-02-15: 7.5 mL

## 2014-02-15 MED ORDER — PROPOFOL 10 MG/ML IV BOLUS
INTRAVENOUS | Status: DC | PRN
  Administered 2014-02-15: 200 mg via INTRAVENOUS

## 2014-02-15 MED ORDER — SUCCINYLCHOLINE CHLORIDE 20 MG/ML IJ SOLN
INTRAMUSCULAR | Status: DC | PRN
  Administered 2014-02-15: 100 mg via INTRAVENOUS

## 2014-02-15 MED ORDER — BUPIVACAINE HCL (PF) 0.25 % IJ SOLN
INTRAMUSCULAR | Status: AC
  Filled 2014-02-15: qty 30

## 2014-02-15 MED ORDER — PROPOFOL 10 MG/ML IV EMUL
INTRAVENOUS | Status: AC
  Filled 2014-02-15: qty 20

## 2014-02-15 SURGICAL SUPPLY — 24 items
ADH SKN CLS APL DERMABOND .7 (GAUZE/BANDAGES/DRESSINGS) ×1
CLIP FILSHIE TUBAL LIGA STRL (Clip) ×3 IMPLANT
CLOTH BEACON ORANGE TIMEOUT ST (SAFETY) ×3 IMPLANT
DERMABOND ADVANCED (GAUZE/BANDAGES/DRESSINGS) ×2
DERMABOND ADVANCED .7 DNX12 (GAUZE/BANDAGES/DRESSINGS) ×1 IMPLANT
DRSG OPSITE POSTOP 3X4 (GAUZE/BANDAGES/DRESSINGS) ×3 IMPLANT
ELECT REM PT RETURN 9FT ADLT (ELECTROSURGICAL) ×3
ELECTRODE REM PT RTRN 9FT ADLT (ELECTROSURGICAL) IMPLANT
GLOVE BIO SURGEON STRL SZ7 (GLOVE) ×3 IMPLANT
NDL HYPO 25X1 1.5 SAFETY (NEEDLE) ×1 IMPLANT
NEEDLE HYPO 25X1 1.5 SAFETY (NEEDLE) ×3 IMPLANT
NS IRRIG 1000ML POUR BTL (IV SOLUTION) ×3 IMPLANT
PACK ABDOMINAL MINOR (CUSTOM PROCEDURE TRAY) ×3 IMPLANT
PENCIL BUTTON HOLSTER BLD 10FT (ELECTRODE) ×3 IMPLANT
SPONGE LAP 4X18 X RAY DECT (DISPOSABLE) ×2 IMPLANT
SUT PLAIN 2 0 (SUTURE)
SUT PLAIN ABS 2-0 54XMFL TIE (SUTURE) IMPLANT
SUT VICRYL 0 UR6 27IN ABS (SUTURE) ×3 IMPLANT
SUT VICRYL 4-0 PS2 18IN ABS (SUTURE) ×3 IMPLANT
SYRINGE CONTROL L 12CC (SYRINGE) ×3 IMPLANT
SYRINGE CONTROL LL 12CC (SYRINGE) ×1 IMPLANT
TOWEL OR 17X24 6PK STRL BLUE (TOWEL DISPOSABLE) ×6 IMPLANT
TRAY FOLEY CATH 14FR (SET/KITS/TRAYS/PACK) ×3 IMPLANT
WATER STERILE IRR 1000ML POUR (IV SOLUTION) ×3 IMPLANT

## 2014-02-15 NOTE — Transfer of Care (Signed)
Immediate Anesthesia Transfer of Care Note  Patient: Melanie [name]  Procedure(s) Performed: Procedure(s): POST PARTUM TUBAL LIGATION (Bilateral)  Patient Location: PACU  Anesthesia Type:General  Level of Consciousness: awake, alert  and oriented  Airway & Oxygen Therapy: Patient Spontanous Breathing and Patient connected to nasal cannula oxygen  Post-op Assessment: Report given to PACU RN and Post -op Vital signs reviewed and stable  Post vital signs: Reviewed and stable  Complications: No apparent anesthesia complications

## 2014-02-15 NOTE — Progress Notes (Signed)
Post Partum Day 1 Subjective: no complaints, up ad lib, voiding, tolerating PO and + flatus S/P preterm SVD Pt desires PP tubal. R/B/A of tubal discussed with patient and she wishes to proceed  Objective: Blood pressure 115/68, pulse 68, temperature 98.2 F (36.8 C), temperature source Oral, resp. rate 18, height 5\' 4"  (1.626 m), weight 80.74 kg (178 lb), SpO2 97.00%, unknown if currently breastfeeding.  Physical Exam:  General: alert, cooperative and appears stated age Lochia: appropriate Uterine Fundus: firm   Recent Labs  02/14/14 1500 02/15/14 0555  HGB 13.3 11.6*  HCT 41.0 35.8*    Assessment/Plan: Social Work consult PP BTL, R/B/A reviewed and consent obtained. Pt understands that tubal ligation is not reversible and she will not be able to have additional children in the future. She understands there is a small risk of regret. There is a risk of failure and if tubal failure occurs the risk of a subsequent ectopic pregnancy is 30%. Pt wishes to proceed.   LOS: 1 day   Heinrich Fertig H. 02/15/2014, 9:26 AM

## 2014-02-15 NOTE — Op Note (Signed)
Pre-Operative Diagnosis: 1) Desired Permanent Sterilization Postoperative Diagnosis: 1) Same Procedure: Postpartum Bilateral Tubal Ligation with Filshie Clips Surgeon: Dr. Vanessa Kick Assistant: None Anesthesia: GETA and 0.25% Marcaine injected locally Operative Findings: Normal fallopian tubes bilaterally Specimen: None EBL: Minimal  Melanie [name] is a 28 yo [phone] s/p a preterm spontaneous vaginal delivery who desires permanent sterilization.  Risks, benefits, and alternatives of the procedure were reviewed with the patient.  Following the appropriate informed consent the patient was taken to the OR where adequate surgical anesthesia was confirmed.  After a time-out procedure was performed that appropriately identified the patient the abdomen was prepped and draped in the normal sterile fashion.  A semilunal infraumbilical skin incision was made with the scalpel and the underlying subcutaneous tissue was dissected bluntly to the level of the fascia. The peritoneum was incised and entry into the abdomen was confirmed with palpation of the fundus.  The patient was tilted to the left and the right fallopian tube was identified, grasped with a babcock clamp, and followed out to the fimbriated end.  The filshie clip was applied to the proximal portion of the fallopian tube and retuned to the abdominal cavity.  The patient was then tilted to the right and the same procedure was repeated on the left fallopian tube.  The fascia was closed with 0 vicryl in a running fashion.  The skin was closed with 4-0 vicryl in a subcuticular fashion and Dermabond was applied. Marcaine 0.25% was injected subcutaneously. All sponge, lap, and needle counts were correct x 2.  The patient tolerated the procedure well and was brought to the recovery room in stable condition.

## 2014-02-15 NOTE — Progress Notes (Signed)
UR chart review completed.  

## 2014-02-15 NOTE — Anesthesia Postprocedure Evaluation (Signed)
  Anesthesia Post-op Note  Anesthesia Post Note  Patient: Melanie [name]  Procedure(s) Performed: Procedure(s) (LRB): POST PARTUM TUBAL LIGATION (Bilateral)  Anesthesia type: General  Patient location: Mother Baby  Post pain: Pain level controlled  Post assessment: Post-op Vital signs reviewed  Last Vitals:  Filed Vitals:   02/15/14 1330  BP: 113/54  Pulse: 59  Temp: 36.4 C  Resp: 18    Post vital signs: Reviewed  Level of consciousness: awake & alert  Complications: No apparent anesthesia complications

## 2014-02-15 NOTE — Anesthesia Preprocedure Evaluation (Addendum)
Anesthesia Evaluation  Patient identified by MRN, date of birth, ID band Patient awake    Reviewed: Allergy & Precautions, H&P , Patient's Chart, lab work & pertinent test results  Airway Mallampati: I TM Distance: >3 FB Neck ROM: full    Dental no notable dental hx. (+) Teeth Intact   Pulmonary neg pulmonary ROS,    Pulmonary exam normal       Cardiovascular negative cardio ROS      Neuro/Psych negative neurological ROS  negative psych ROS   GI/Hepatic negative GI ROS, Neg liver ROS,   Endo/Other  negative endocrine ROSdiabetes, Type 2, Insulin Dependent  Renal/GU negative Renal ROS     Musculoskeletal   Abdominal Normal abdominal exam  (+)   Peds  Hematology negative hematology ROS (+)   Anesthesia Other Findings   Reproductive/Obstetrics negative OB ROS                          Anesthesia Physical Anesthesia Plan  ASA: II  Anesthesia Plan: General   Post-op Pain Management:    Induction: Intravenous, Rapid sequence and Cricoid pressure planned  Airway Management Planned: Oral ETT  Additional Equipment:   Intra-op Plan:   Post-operative Plan: Extubation in OR  Informed Consent: I have reviewed the patients History and Physical, chart, labs and discussed the procedure including the risks, benefits and alternatives for the proposed anesthesia with the patient or authorized representative who has indicated his/her understanding and acceptance.   Dental Advisory Given  Plan Discussed with: CRNA and Surgeon  Anesthesia Plan Comments:        Anesthesia Quick Evaluation

## 2014-02-15 NOTE — Progress Notes (Signed)
CSW acknowledges consult "per patient request".  Consult being screened out as [employer] shared that they needed a Education officer, museum due to not having a car seat.  CSW notified RN.

## 2014-02-16 LAB — GLUCOSE, CAPILLARY: Glucose-Capillary: 163 mg/dL — ABNORMAL HIGH (ref 70–99)

## 2014-02-16 NOTE — Progress Notes (Signed)
RN requested CSW intervention regarding connecting mother with resources if needed.  Met with mother and maternal grandmother.  She is a single parent with 3 other dependents ages 7,5, and 2.  Informed that she needs assistance with purchasing a carseat.  She also has a WIC appointment scheduled for 18th and wanted to know if CSW could assist with moving up the appointment, because she does not have other resources to get milk for newborn.  Mother informed that message will be left for the CSW on Monday to contact the WIC office regarding possibility of moving up the appointment seeing that the WIC office is closed over the weekend.    Grandmother stated that she would be able to get the $30.00 needed for a car seat through the hospital.   Mother also receives foodstamps and has housing assistance.  Phone number on face sheet is no longer worker.  She can be reached at [phone].    

## 2014-02-16 NOTE — Progress Notes (Signed)
Post Partum Day 2/POD#1 S/P PP BTL Subjective: no complaints, up ad lib, voiding and tolerating PO  Objective: Blood pressure 121/57, pulse 66, temperature 98.6 F (37 C), temperature source Oral, resp. rate 16, height 5\' 4"  (1.626 m), weight 80.74 kg (178 lb), SpO2 98.00%, unknown if currently breastfeeding.  Physical Exam:  General: alert, cooperative and appears stated age Lochia: appropriate Uterine Fundus: firm Incision: healing well   Recent Labs  02/14/14 1500 02/15/14 0555  HGB 13.3 11.6*  HCT 41.0 35.8*    Assessment/Plan: Plan for discharge tomorrow Baby not being released by peds   LOS: 2 days   Melanie [name]. 02/16/2014, 12:39 PM

## 2014-02-17 MED ORDER — OXYCODONE-ACETAMINOPHEN 5-325 MG PO TABS: 2.0000 | ORAL_TABLET | ORAL | Status: DC | PRN

## 2014-02-17 MED ORDER — IBUPROFEN 600 MG PO TABS: 600.0000 mg | ORAL_TABLET | Freq: Four times a day (QID) | ORAL | Status: DC | PRN

## 2014-02-17 MED ORDER — DOCUSATE SODIUM 100 MG PO CAPS: 100.0000 mg | ORAL_CAPSULE | Freq: Two times a day (BID) | ORAL | Status: DC

## 2014-02-17 NOTE — Discharge Summary (Signed)
Obstetric Discharge Summary Reason for Admission: onset of labor Prenatal Procedures: NST, ultrasound and BMZ, glucoregulation Intrapartum Procedures: spontaneous vaginal delivery, PP BTL Postpartum Procedures: none Complications-Operative and Postpartum: none Hemoglobin  Date Value Ref Range Status  02/15/2014 11.6* 12.0 - 15.0 g/dL Final     HCT  Date Value Ref Range Status  02/15/2014 35.8* 36.0 - 46.0 % Final    Physical Exam:  General: alert, cooperative and appears stated age 28: appropriate Uterine Fundus: firm Incision: healing well DVT Evaluation: No evidence of DVT seen on physical exam.  Discharge Diagnoses: Premature labor and Preterm delivery, Gestational diabetes, possible pre-gestational diabetes  Discharge Information: Date: 02/17/2014 Activity: pelvic rest Diet: Diabetic diet Medications: Ibuprofen, Colace and Percocet Condition: improved Instructions: refer to practice specific booklet Discharge to: home Follow-up Information   Follow up with Olga Millers, MD In 4 weeks. (For a postpartum check)    Specialty:  Obstetrics and Gynecology   Contact information:   [place] [id number] [phone]       Follow up with Nolene Ebbs A, MD In 2 weeks. (To discussed diabetes management)    Specialty:  Internal Medicine   Contact information:   [place] [address] [phone]       Newborn Data: Live born female  Birth Weight: 5 lb 14 oz (2665 g) APGAR: 8, 9  Home with mother.  Domnique Vantine H. 02/17/2014, 10:28 AM

## 2014-02-17 NOTE — Progress Notes (Signed)
Revisited mother.  Her [facility] appointment is for 8/13 not 8/18.  She now has enough formula to last her until her [place] apt. on the 13th.  She will reportedly follow up with newborn's Pediatrician tomorrow and will be given more formula if needed.  Confirmed this with the Pediatrician.  Provided her with a list of food assistant programs in the community.  Mother was also able to get a car seat.  Informed that maternal grandmother's friend gave it to her.   Spoke with her regarding her hx of marijuana use.  She admits to occasional use of marijuana, but denies need for treatment.  Last use was about 2 months ago.  UDS on newborn was negative.  Mother informed of the hospital's drug screen policy.

## 2014-02-18 ENCOUNTER — Encounter (HOSPITAL_COMMUNITY): Payer: Self-pay | Admitting: Obstetrics and Gynecology

## 2014-02-18 NOTE — Anesthesia Postprocedure Evaluation (Signed)
Anesthesia Post Note  Patient: Melanie [name]  Procedure(s) Performed: Procedure(s) (LRB): POST PARTUM TUBAL LIGATION (Bilateral)  Anesthesia type: General  Patient location: PACU  Post pain: Pain level controlled  Post assessment: Post-op Vital signs reviewed  Last Vitals:  Filed Vitals:   02/17/14 0623  BP: 130/76  Pulse: 58  Temp: 36.9 C  Resp: 18    Post vital signs: Reviewed  Level of consciousness: sedated  Complications: No apparent anesthesia complications

## 2014-02-26 ENCOUNTER — Encounter: Payer: Self-pay | Admitting: Cardiology

## 2014-04-26 ENCOUNTER — Other Ambulatory Visit: Payer: Self-pay

## 2014-05-06 ENCOUNTER — Emergency Department (HOSPITAL_COMMUNITY): Payer: Medicaid Other

## 2014-05-06 ENCOUNTER — Inpatient Hospital Stay (HOSPITAL_COMMUNITY)
Admission: EM | Admit: 2014-05-06 | Discharge: 2014-05-08 | DRG: 387 | Disposition: A | Discharge status: Home / Self Care | Payer: Medicaid Other | Attending: Internal Medicine | Admitting: Internal Medicine

## 2014-05-06 DIAGNOSIS — I071 Rheumatic tricuspid insufficiency: Secondary | ICD-10-CM

## 2014-05-06 DIAGNOSIS — O42919 Preterm premature rupture of membranes, unspecified as to length of time between rupture and onset of labor, unspecified trimester: Secondary | ICD-10-CM

## 2014-05-06 DIAGNOSIS — E860 Dehydration: Secondary | ICD-10-CM

## 2014-05-06 DIAGNOSIS — Z79899 Other long term (current) drug therapy: Secondary | ICD-10-CM

## 2014-05-06 DIAGNOSIS — K5090 Crohn's disease, unspecified, without complications: Secondary | ICD-10-CM | POA: Diagnosis present

## 2014-05-06 DIAGNOSIS — E876 Hypokalemia: Secondary | ICD-10-CM

## 2014-05-06 DIAGNOSIS — M461 Sacroiliitis, not elsewhere classified: Secondary | ICD-10-CM | POA: Diagnosis present

## 2014-05-06 DIAGNOSIS — F1290 Cannabis use, unspecified, uncomplicated: Secondary | ICD-10-CM | POA: Diagnosis present

## 2014-05-06 DIAGNOSIS — R112 Nausea with vomiting, unspecified: Secondary | ICD-10-CM

## 2014-05-06 DIAGNOSIS — Z3490 Encounter for supervision of normal pregnancy, unspecified, unspecified trimester: Secondary | ICD-10-CM

## 2014-05-06 DIAGNOSIS — R109 Unspecified abdominal pain: Secondary | ICD-10-CM

## 2014-05-06 DIAGNOSIS — O24419 Gestational diabetes mellitus in pregnancy, unspecified control: Secondary | ICD-10-CM

## 2014-05-06 DIAGNOSIS — K50919 Crohn's disease, unspecified, with unspecified complications: Secondary | ICD-10-CM

## 2014-05-06 DIAGNOSIS — Z8632 Personal history of gestational diabetes: Secondary | ICD-10-CM

## 2014-05-06 DIAGNOSIS — K5010 Crohn's disease of large intestine without complications: Secondary | ICD-10-CM

## 2014-05-06 DIAGNOSIS — Z86018 Personal history of other benign neoplasm: Secondary | ICD-10-CM

## 2014-05-06 DIAGNOSIS — O4692 Antepartum hemorrhage, unspecified, second trimester: Secondary | ICD-10-CM

## 2014-05-06 DIAGNOSIS — K529 Noninfective gastroenteritis and colitis, unspecified: Secondary | ICD-10-CM | POA: Diagnosis present

## 2014-05-06 DIAGNOSIS — Z8679 Personal history of other diseases of the circulatory system: Secondary | ICD-10-CM

## 2014-05-06 DIAGNOSIS — O24414 Gestational diabetes mellitus in pregnancy, insulin controlled: Secondary | ICD-10-CM

## 2014-05-06 DIAGNOSIS — R197 Diarrhea, unspecified: Secondary | ICD-10-CM

## 2014-05-06 LAB — I-STAT BETA HCG BLOOD, ED (MC, WL, AP ONLY)

## 2014-05-06 LAB — CBC WITH DIFFERENTIAL/PLATELET
BASOS PCT: 1 % (ref 0–1)
Basophils Absolute: 0 10*3/uL (ref 0.0–0.1)
EOS PCT: 2 % (ref 0–5)
Eosinophils Absolute: 0.2 10*3/uL (ref 0.0–0.7)
HCT: 37.1 % (ref 36.0–46.0)
HEMOGLOBIN: 12.1 g/dL (ref 12.0–15.0)
Lymphocytes Relative: 30 % (ref 12–46)
Lymphs Abs: 2.4 10*3/uL (ref 0.7–4.0)
MCH: 29.2 pg (ref 26.0–34.0)
MCHC: 32.6 g/dL (ref 30.0–36.0)
MCV: 89.4 fL (ref 78.0–100.0)
MONO ABS: 0.7 10*3/uL (ref 0.1–1.0)
MONOS PCT: 8 % (ref 3–12)
Neutro Abs: 4.7 10*3/uL (ref 1.7–7.7)
Neutrophils Relative %: 59 % (ref 43–77)
Platelets: 264 10*3/uL (ref 150–400)
RBC: 4.15 MIL/uL (ref 3.87–5.11)
RDW: 13.7 % (ref 11.5–15.5)
WBC: 7.9 10*3/uL (ref 4.0–10.5)

## 2014-05-06 LAB — COMPREHENSIVE METABOLIC PANEL
ALBUMIN: 3.8 g/dL (ref 3.5–5.2)
ALT: 13 U/L (ref 0–35)
ANION GAP: 17 — AB (ref 5–15)
AST: 29 U/L (ref 0–37)
Alkaline Phosphatase: 98 U/L (ref 39–117)
BILIRUBIN TOTAL: 0.3 mg/dL (ref 0.3–1.2)
BUN: 9 mg/dL (ref 6–23)
CHLORIDE: 103 meq/L (ref 96–112)
CO2: 19 mEq/L (ref 19–32)
CREATININE: 0.7 mg/dL (ref 0.50–1.10)
Calcium: 9.4 mg/dL (ref 8.4–10.5)
GFR calc Af Amer: >90 mL/min (ref >90)
GFR calc non Af Amer: >90 mL/min (ref >90)
Glucose, Bld: 195 mg/dL — ABNORMAL HIGH (ref 70–99)
Potassium: 3.2 mEq/L — ABNORMAL LOW (ref 3.7–5.3)
Sodium: 139 mEq/L (ref 137–147)
Total Protein: 7.3 g/dL (ref 6.0–8.3)

## 2014-05-06 LAB — LIPASE, BLOOD: LIPASE: 24 U/L (ref 11–59)

## 2014-05-06 MED ORDER — SODIUM CHLORIDE 0.9 % IV SOLN
1000.0000 mL | Freq: Once | INTRAVENOUS | Status: AC
  Administered 2014-05-06: 1000 mL via INTRAVENOUS

## 2014-05-06 MED ORDER — IOHEXOL 300 MG/ML  SOLN: 50.0000 mL | Freq: Once | INTRAMUSCULAR | Status: AC | PRN

## 2014-05-06 MED ORDER — ONDANSETRON HCL 4 MG/2ML IJ SOLN
4.0000 mg | Freq: Once | INTRAMUSCULAR | Status: AC
  Administered 2014-05-06: 4 mg via INTRAVENOUS
  Filled 2014-05-06: qty 2

## 2014-05-06 MED ORDER — HYDROMORPHONE HCL 1 MG/ML IJ SOLN
1.0000 mg | Freq: Once | INTRAMUSCULAR | Status: AC
Start: 2014-05-06 — End: 2014-05-06
  Administered 2014-05-06: 1 mg via INTRAVENOUS
  Filled 2014-05-06: qty 1

## 2014-05-06 NOTE — ED Notes (Signed)
Requested urine from patient. Patient does not have to urinate at this time but is aware that urine is needed

## 2014-05-06 NOTE — ED Notes (Signed)
Patient stated that she is still unable to urinate.

## 2014-05-06 NOTE — ED Notes (Signed)
Per PTAR: Pt reports abdominal pain, emesis, and diarrhea onset 2 hours ago, states she has a hx of Crohn's and this feels like a Crohn's flare up. Pain to lower abdomen. No SOB, no dizziness/ lightheadedness.

## 2014-05-06 NOTE — ED Notes (Signed)
Pt is unable to give a urine sample at this time. Pt did attempt to try but was unsuccessful.

## 2014-05-06 NOTE — ED Provider Notes (Signed)
CSN: [id number]     Arrival date & time 05/06/14  2148 History   First MD Initiated Contact with Patient 05/06/14 2200     Chief Complaint  Patient presents with  . Emesis  . Abdominal Pain     (Consider location/radiation/quality/duration/timing/severity/associated sxs/prior Treatment) HPI Melanie [name] is a 28 y.o. female who presents to emergency department complaining of abdominal pain. Patient states she has history of Crohn's disease. States developed severe abdominal pain onset this morning. States started vomiting approximately 2 hours prior to calling EMS. She did not take any medications prior to coming in. She does not have a PCP or gastroenterologist at this time. She does not take any medications for her Crohn's disease. She is 3 months postpartum. No prior abdominal surgeries. No other complaints.  Past Medical History  Diagnosis Date  . Anemia   . Gestational diabetes mellitus in pregnancy   . Cardiac tumor   . Crohn's disease   . History of genital warts   . Gestational diabetes    Past Surgical History  Procedure Laterality Date  . Cardiac surgery      at 28 years old  . Tubal ligation Bilateral 02/15/2014    Procedure: POST PARTUM TUBAL LIGATION;  Surgeon: Farrel Gobble. Harrington Challenger, MD;  Location: [facility];  Service: Gynecology;  Laterality: Bilateral;   Family History  Problem Relation Age of Onset  . Hypertension Mother   . Heart Problems Mother     tumor, open heart surgery  . Miscarriages / [place] Mother   . Heart Problems Maternal Aunt     tumor  . Diabetes Father   . Alcohol abuse Father   . Diabetes Paternal Grandmother   . Diabetes Maternal Grandmother    History  Substance Use Topics  . Smoking status: Never Smoker   . Smokeless tobacco: Never Used  . Alcohol Use: Yes     Comment: occasionally. NONE SINCE FOUND OUT WAS PREG   OB History   Grav Para Term Preterm Abortions TAB SAB Ect Mult Living   4 4 2 2      4      Review of Systems   Constitutional: Negative for fever and chills.  Respiratory: Negative for cough, chest tightness and shortness of breath.   Cardiovascular: Negative for chest pain, palpitations and leg swelling.  Gastrointestinal: Positive for nausea, vomiting, abdominal pain and diarrhea. Negative for blood in stool.  Genitourinary: Negative for dysuria, flank pain, vaginal bleeding, vaginal discharge, vaginal pain and pelvic pain.  Musculoskeletal: Negative for arthralgias, myalgias, neck pain and neck stiffness.  Skin: Negative for rash.  Neurological: Negative for dizziness, weakness and headaches.  All other systems reviewed and are negative.     Allergies  Review of patient's allergies indicates no known allergies.  Home Medications   Prior to Admission medications   Not on File   BP 107/79  Pulse 57  Temp(Src) 98 F (36.7 C) (Oral)  Resp 20  SpO2 100% Physical Exam  Nursing note and vitals reviewed. Constitutional: She is oriented to person, place, and time. She appears well-developed and well-nourished.  Ill-appearing, actively vomiting  HENT:  Head: Normocephalic.  Eyes: Conjunctivae are normal.  Neck: Neck supple.  Cardiovascular: Normal rate, regular rhythm and normal heart sounds.   Pulmonary/Chest: Effort normal and breath sounds normal. No respiratory distress. She has no wheezes. She has no rales.  Abdominal: Soft. Bowel sounds are normal. She exhibits no distension. There is tenderness. There is no rebound and  no guarding.  Diffuse tenderness, no guarding  Musculoskeletal: She exhibits no edema.  Neurological: She is alert and oriented to person, place, and time.  Skin: Skin is warm and dry.  Psychiatric: She has a normal mood and affect. Her behavior is normal.    ED Course  Procedures (including critical care time) Labs Review Labs Reviewed  COMPREHENSIVE METABOLIC PANEL - Abnormal; Notable for the following:    Potassium 3.2 (*)    Glucose, Bld 195 (*)     Anion gap 17 (*)    All other components within normal limits  URINALYSIS, ROUTINE W REFLEX MICROSCOPIC - Abnormal; Notable for the following:    Glucose, UA 250 (*)    Ketones, ur 40 (*)    All other components within normal limits  CBC WITH DIFFERENTIAL  LIPASE, BLOOD  PREGNANCY, URINE  I-STAT BETA HCG BLOOD, ED ([facility], [facility], [facility] ONLY)    Imaging Review Ct Abdomen Pelvis W Contrast  05/07/2014   CLINICAL DATA:  Lower abdominal pain with nausea, vomiting and diarrhea beginning today at 1930 hours. History of Crohn's disease. Recent pregnancy.  EXAM: CT ABDOMEN AND PELVIS WITH CONTRAST  TECHNIQUE: Multidetector CT imaging of the abdomen and pelvis was performed using the standard protocol following bolus administration of intravenous contrast.  CONTRAST:  100 cc Omnipaque 300.  COMPARISON:  Pelvic ultrasound February 03, 2014 and CT of the abdomen and pelvis October 06, 2013  FINDINGS: LUNG BASES: Included view of the lung bases are clear. Visualized heart and pericardium are unremarkable. Partially imaged median sternotomy with fractured wires.  SOLID ORGANS: The spleen, pancreas and adrenal glands are unremarkable. Scattered sub cm hypodensities in the bilateral lobes the liver likely reflects cysts, the liver is otherwise unremarkable. 3 mm gallstone without CT findings of acute cholecystitis.  GASTROINTESTINAL TRACT: The stomach is unremarkable. Mild small bowel wall thickening extending to the terminal ileum, with superimposed multi segmental colonic wall thickening and pericolonic inflammation most evident and the ascending colon. Normal appendix.  KIDNEYS/ URINARY TRACT: Kidneys are orthotopic, demonstrating symmetric enhancement. No nephrolithiasis, hydronephrosis or solid renal masses. The unopacified ureters are normal in course and caliber. Urinary bladder is partially distended and unremarkable.  PERITONEUM/RETROPERITONEUM: Small amount of free fluid in the pelvis without abscess nor intraperitoneal  free air. Aortoiliac vessels are normal in course and caliber. No lymphadenopathy by CT size criteria. Internal reproductive organs are unremarkable.  SOFT TISSUE/OSSEOUS STRUCTURES: RIGHT greater than LEFT sacroiliac sclerosis and apparent erosions on the RIGHT.  IMPRESSION: Multi segmental small and large bowel inflammation concerning for acute Crohn's exacerbation. No bowel perforation or obstruction.  Apparent RIGHT greater than LEFT sacroiliitis which can be associated with inflammatory bowel disease.  Cholelithiasis without CT findings of acute cholecystitis.   Electronically Signed   By: Elon Alas   On: 05/07/2014 00:52     EKG Interpretation None      MDM   Final diagnoses:  Abdominal pain  Crohn's colitis, without complications    Pt seen and examined. Hx of crohn's disease. Here with increased pain, vomiting, onset today. Pt actively vomiting on arrival. No hemataemesis.    12:10 AM Pain reassessed. Still 10/10 after dilaudid and zofran. Will order another dose of dilaudid and phenergan. Pt still vomiting.   1:17 AM CT confirming colitis and acute crohn's exacerbation. Pt still having pain. Will admit for IV fluids and pain management Spoke with triad, asked to give steroids, keep NPO. Will admit.   Filed Vitals:  05/06/14 2158 05/06/14 2322 05/07/14 0055  BP: 107/79 107/79 120/79  Pulse: 57 58 72  Temp: 98 F (36.7 C)  97.9 F (36.6 C)  TempSrc: Oral  Oral  Resp: 20 18 18   SpO2: 100% 99% 100%     Florene Route Derral Colucci, PA-C 05/07/14 0118

## 2014-05-06 NOTE — ED Notes (Signed)
Bed: PQ24 Expected date: 05/06/14 Expected time: 9:32 PM Means of arrival: Ambulance Comments: 28 yo F N/V.D

## 2014-05-07 ENCOUNTER — Encounter (HOSPITAL_COMMUNITY): Payer: Self-pay | Admitting: Internal Medicine

## 2014-05-07 ENCOUNTER — Encounter: Payer: Self-pay | Admitting: Gastroenterology

## 2014-05-07 DIAGNOSIS — K529 Noninfective gastroenteritis and colitis, unspecified: Secondary | ICD-10-CM | POA: Diagnosis present

## 2014-05-07 DIAGNOSIS — F1290 Cannabis use, unspecified, uncomplicated: Secondary | ICD-10-CM | POA: Diagnosis present

## 2014-05-07 DIAGNOSIS — M461 Sacroiliitis, not elsewhere classified: Secondary | ICD-10-CM | POA: Diagnosis present

## 2014-05-07 DIAGNOSIS — K5010 Crohn's disease of large intestine without complications: Secondary | ICD-10-CM | POA: Diagnosis not present

## 2014-05-07 DIAGNOSIS — R109 Unspecified abdominal pain: Secondary | ICD-10-CM | POA: Diagnosis present

## 2014-05-07 DIAGNOSIS — Z79899 Other long term (current) drug therapy: Secondary | ICD-10-CM | POA: Diagnosis not present

## 2014-05-07 DIAGNOSIS — Z8632 Personal history of gestational diabetes: Secondary | ICD-10-CM | POA: Diagnosis not present

## 2014-05-07 DIAGNOSIS — K50919 Crohn's disease, unspecified, with unspecified complications: Secondary | ICD-10-CM

## 2014-05-07 DIAGNOSIS — K5090 Crohn's disease, unspecified, without complications: Secondary | ICD-10-CM | POA: Diagnosis present

## 2014-05-07 LAB — CBC WITH DIFFERENTIAL/PLATELET
BASOS ABS: 0 10*3/uL (ref 0.0–0.1)
BASOS PCT: 0 % (ref 0–1)
EOS ABS: 0 10*3/uL (ref 0.0–0.7)
Eosinophils Relative: 0 % (ref 0–5)
HEMATOCRIT: 35.6 % — AB (ref 36.0–46.0)
Hemoglobin: 11.6 g/dL — ABNORMAL LOW (ref 12.0–15.0)
Lymphocytes Relative: 11 % — ABNORMAL LOW (ref 12–46)
Lymphs Abs: 0.8 10*3/uL (ref 0.7–4.0)
MCH: 29.3 pg (ref 26.0–34.0)
MCHC: 32.6 g/dL (ref 30.0–36.0)
MCV: 89.9 fL (ref 78.0–100.0)
MONO ABS: 0.2 10*3/uL (ref 0.1–1.0)
Monocytes Relative: 3 % (ref 3–12)
NEUTROS PCT: 86 % — AB (ref 43–77)
Neutro Abs: 5.9 10*3/uL (ref 1.7–7.7)
Platelets: 254 10*3/uL (ref 150–400)
RBC: 3.96 MIL/uL (ref 3.87–5.11)
RDW: 13.8 % (ref 11.5–15.5)
WBC: 7 10*3/uL (ref 4.0–10.5)

## 2014-05-07 LAB — URINALYSIS, ROUTINE W REFLEX MICROSCOPIC
BILIRUBIN URINE: NEGATIVE
Glucose, UA: 250 mg/dL — AB
HGB URINE DIPSTICK: NEGATIVE
Ketones, ur: 40 mg/dL — AB
Leukocytes, UA: NEGATIVE
Nitrite: NEGATIVE
Protein, ur: NEGATIVE mg/dL
Specific Gravity, Urine: 1.015 (ref 1.005–1.030)
UROBILINOGEN UA: 0.2 mg/dL (ref 0.0–1.0)
pH: 7 (ref 5.0–8.0)

## 2014-05-07 LAB — COMPREHENSIVE METABOLIC PANEL
ALK PHOS: 96 U/L (ref 39–117)
ALT: 12 U/L (ref 0–35)
ANION GAP: 12 (ref 5–15)
AST: 27 U/L (ref 0–37)
Albumin: 3.6 g/dL (ref 3.5–5.2)
BILIRUBIN TOTAL: 0.4 mg/dL (ref 0.3–1.2)
BUN: 5 mg/dL — AB (ref 6–23)
CHLORIDE: 105 meq/L (ref 96–112)
CO2: 21 mEq/L (ref 19–32)
Calcium: 8.8 mg/dL (ref 8.4–10.5)
Creatinine, Ser: 0.56 mg/dL (ref 0.50–1.10)
GFR calc non Af Amer: >90 mL/min (ref >90)
GLUCOSE: 115 mg/dL — AB (ref 70–99)
POTASSIUM: 3.6 meq/L — AB (ref 3.7–5.3)
Sodium: 138 mEq/L (ref 137–147)
Total Protein: 6.9 g/dL (ref 6.0–8.3)

## 2014-05-07 LAB — PREGNANCY, URINE: Preg Test, Ur: NEGATIVE

## 2014-05-07 LAB — HEMOGLOBIN A1C
Hgb A1c MFr Bld: 5.7 % — ABNORMAL HIGH (ref <5.7)
Mean Plasma Glucose: 117 mg/dL — ABNORMAL HIGH (ref <117)

## 2014-05-07 MED ORDER — METHYLPREDNISOLONE SODIUM SUCC 125 MG IJ SOLR
125.0000 mg | Freq: Once | INTRAMUSCULAR | Status: AC
  Administered 2014-05-07: 125 mg via INTRAVENOUS
  Filled 2014-05-07: qty 2

## 2014-05-07 MED ORDER — ONDANSETRON HCL 4 MG PO TABS: 4.0000 mg | ORAL_TABLET | Freq: Four times a day (QID) | ORAL | Status: DC | PRN

## 2014-05-07 MED ORDER — ENOXAPARIN SODIUM 40 MG/0.4ML ~~LOC~~ SOLN
40.0000 mg | SUBCUTANEOUS | Status: DC
  Administered 2014-05-07: 40 mg via SUBCUTANEOUS
  Filled 2014-05-07 (×2): qty 0.4

## 2014-05-07 MED ORDER — SODIUM CHLORIDE 0.9 % IV SOLN
INTRAVENOUS | Status: AC
  Administered 2014-05-07 (×2): via INTRAVENOUS

## 2014-05-07 MED ORDER — CIPROFLOXACIN HCL 500 MG PO TABS
500.0000 mg | ORAL_TABLET | Freq: Two times a day (BID) | ORAL | Status: DC
  Administered 2014-05-07: 500 mg via ORAL
  Filled 2014-05-07 (×4): qty 1

## 2014-05-07 MED ORDER — ACETAMINOPHEN 650 MG RE SUPP: 650.0000 mg | Freq: Four times a day (QID) | RECTAL | Status: DC | PRN

## 2014-05-07 MED ORDER — SODIUM CHLORIDE 0.9 % IV SOLN
Freq: Once | INTRAVENOUS | Status: AC
Start: 2014-05-07 — End: 2014-05-07
  Administered 2014-05-07: 125 mL/h via INTRAVENOUS

## 2014-05-07 MED ORDER — PROMETHAZINE HCL 25 MG/ML IJ SOLN
12.5000 mg | Freq: Once | INTRAMUSCULAR | Status: AC
  Administered 2014-05-07: 12.5 mg via INTRAVENOUS
  Filled 2014-05-07: qty 1

## 2014-05-07 MED ORDER — IOHEXOL 300 MG/ML  SOLN
100.0000 mL | Freq: Once | INTRAMUSCULAR | Status: AC | PRN
  Administered 2014-05-07: 100 mL via INTRAVENOUS

## 2014-05-07 MED ORDER — MORPHINE SULFATE 2 MG/ML IJ SOLN
1.0000 mg | INTRAMUSCULAR | Status: DC | PRN
  Administered 2014-05-07: 1 mg via INTRAVENOUS
  Filled 2014-05-07: qty 1

## 2014-05-07 MED ORDER — HYDROMORPHONE HCL 1 MG/ML IJ SOLN
1.0000 mg | Freq: Once | INTRAMUSCULAR | Status: AC
  Administered 2014-05-07: 1 mg via INTRAVENOUS
  Filled 2014-05-07: qty 1

## 2014-05-07 MED ORDER — ACETAMINOPHEN 325 MG PO TABS
650.0000 mg | ORAL_TABLET | Freq: Four times a day (QID) | ORAL | Status: DC | PRN
Start: 2014-05-07 — End: 2014-05-08

## 2014-05-07 MED ORDER — ONDANSETRON HCL 4 MG/2ML IJ SOLN: 4.0000 mg | Freq: Four times a day (QID) | INTRAMUSCULAR | Status: DC | PRN

## 2014-05-07 MED ORDER — METRONIDAZOLE IN NACL 5-0.79 MG/ML-% IV SOLN
500.0000 mg | Freq: Two times a day (BID) | INTRAVENOUS | Status: DC
  Administered 2014-05-07 – 2014-05-08 (×2): 500 mg via INTRAVENOUS
  Filled 2014-05-07 (×3): qty 100

## 2014-05-07 NOTE — ED Notes (Signed)
Patient went to CT

## 2014-05-07 NOTE — Consult Note (Signed)
Referring Provider: No ref. provider found Primary Care Physician:  Philis Fendt, MD Primary Gastroenterologist:  Althia Forts; was dismissed from [facility]  Reason for Consultation:  Crohn's disease  HPI: Melanie [name] is a 28 y.o. female with history of Crohn's disease who presented to the ER because of sudden onset abdominal pain and nausea/vomiting with diarrhea. Patient says that she had been feeling fine with no GI complaints until suddenly on 10/25 when she developed some left sided abdominal pain followed by nausea, vomiting, and diarrhea.  Denies blood in her stool.  Patient states she has history of Crohn's but has not taken her medication for last 1 year because of recent pregnancy.  The only medication that she was on was Pentasa as far as she can recall.  She cannot remember having a colonoscopy, but there is a note in her history that says she had "active ileitis" on biopsies.  In the ER CAT scan abdomen and pelvis was done which showed multiple segments of large and small bowel inflammation concerning for Crohn's disease.  She was given one dose of IV solumedrol 125 mg along with antiemetics.  She says that since she came in that she feels much better.  No further nausea and vomiting; tolerating clear liquids and some full liquids.  Still with some mild left sided abdominal pain.  Had a semi-formed bowel movement since admission, no diarrhea.  Really wants to go home today if possible because she has 4 children at home, one of which is a newborn.   Past Medical History  Diagnosis Date  . Anemia   . Gestational diabetes mellitus in pregnancy   . Cardiac tumor   . Crohn's disease   . History of genital warts   . Gestational diabetes     Past Surgical History  Procedure Laterality Date  . Cardiac surgery      at 28 years old  . Tubal ligation Bilateral 02/15/2014    Procedure: POST PARTUM TUBAL LIGATION;  Surgeon: Farrel Gobble. Harrington Challenger, MD;  Location: [facility];  Service: Gynecology;   Laterality: Bilateral;  . Colonoscopy w/ biopsies  08/2012    active ileitis, normal colon (bxs of both) Dr. Oletta Lamas    Prior to Admission medications   Not on File    Current Facility-Administered Medications  Medication Dose Route Frequency Provider Last Rate Last Dose  . 0.9 %  sodium chloride infusion   Intravenous Continuous Rise Patience, MD 75 mL/hr at 05/07/14 [phone]    . acetaminophen (TYLENOL) tablet 650 mg  650 mg Oral Q6H PRN Rise Patience, MD       Or  . acetaminophen (TYLENOL) suppository 650 mg  650 mg Rectal Q6H PRN Rise Patience, MD      . enoxaparin (LOVENOX) injection 40 mg  40 mg Subcutaneous Q24H Rise Patience, MD   40 mg at 05/07/14 0939  . morphine 2 MG/ML injection 1 mg  1 mg Intravenous Q3H PRN Rise Patience, MD   1 mg at 05/07/14 0947  . ondansetron (ZOFRAN) tablet 4 mg  4 mg Oral Q6H PRN Rise Patience, MD       Or  . ondansetron [facility]) injection 4 mg  4 mg Intravenous Q6H PRN Rise Patience, MD        Allergies as of 05/06/2014  . (No Known Allergies)    Family History  Problem Relation Age of Onset  . Hypertension Mother   . Heart Problems Mother  tumor, open heart surgery  . Miscarriages / [place] Mother   . Heart Problems Maternal Aunt     tumor  . Diabetes Father   . Alcohol abuse Father   . Diabetes Paternal Grandmother   . Diabetes Maternal Grandmother     History   Social History  . Marital Status: Single    Spouse Name: N/A    Number of Children: 3  . Years of Education: N/A   Occupational History  . Not on file.   Social History Main Topics  . Smoking status: Never Smoker   . Smokeless tobacco: Never Used  . Alcohol Use: Yes     Comment: occasionally. NONE SINCE FOUND OUT WAS PREG  . Drug Use: 1.00 per week    Special: Marijuana     Comment: states she smoked 2 weeks ago when she was overly stressed, none since then.LAST USED- 2 WEEKS AGO  . Sexual Activity: Yes    Birth  Control/ Protection: None   Other Topics Concern  . Not on file   Social History Narrative  . No narrative on file    Review of Systems: Ten point ROS is O/W negative except as mentioned in HPI.  Physical Exam: Vital signs in last 24 hours: Temp:  [97.9 F (36.6 C)-98.7 F (37.1 C)] 98.7 F (37.1 C) (10/27 0630) Pulse Rate:  [49-82] 80 (10/27 0630) Resp:  [18-20] 18 (10/27 0630) BP: (94-127)/(51-79) 120/68 mmHg (10/27 0630) SpO2:  [94 %-100 %] 99 % (10/27 0630) Weight:  [158 lb 1.6 oz (71.714 kg)] 158 lb 1.6 oz (71.714 kg) (10/27 0245) Last BM Date: 05/06/14 General:  Alert, Well-developed, well-nourished, pleasant and cooperative in NAD Head:  Normocephalic and atraumatic. Eyes:  Sclera clear, no icterus.  Conjunctiva pink. Ears:  Normal auditory acuity. Mouth:  No deformity or lesions.   Lungs:  Clear throughout to auscultation.  No wheezes, crackles, or rhonchi.  Heart:  Regular rate and rhythm; no murmurs, clicks, rubs, or gallops. Abdomen:  Soft, non-distended.  BS present.  Minimal left sided TTP without R/R/G.  Rectal:  Deferred  Msk:  Symmetrical without gross deformities. Pulses:  Normal pulses noted. Extremities:  Without clubbing or edema. Neurologic:  Alert and  oriented x4;  grossly normal neurologically. Skin:  Intact without significant lesions or rashes. Psych:  Alert and cooperative. Normal mood and affect.  Intake/Output from previous day: 10/26 0701 - 10/27 0700 In: 1176.3 [I.V.:1176.3] Out: -   Lab Results:  Recent Labs  05/06/14 2219 05/07/14 0405  WBC 7.9 7.0  HGB 12.1 11.6*  HCT 37.1 35.6*  PLT 264 254   BMET  Recent Labs  05/06/14 2219 05/07/14 0405  NA 139 138  K 3.2* 3.6*  CL 103 105  CO2 19 21  GLUCOSE 195* 115*  BUN 9 5*  CREATININE 0.70 0.56  CALCIUM 9.4 8.8   LFT  Recent Labs  05/07/14 0405  PROT 6.9  ALBUMIN 3.6  AST 27  ALT 12  ALKPHOS 96  BILITOT 0.4   Studies/Results: Ct Abdomen Pelvis W  Contrast  05/07/2014   CLINICAL DATA:  Lower abdominal pain with nausea, vomiting and diarrhea beginning today at 1930 hours. History of Crohn's disease. Recent pregnancy.  EXAM: CT ABDOMEN AND PELVIS WITH CONTRAST  TECHNIQUE: Multidetector CT imaging of the abdomen and pelvis was performed using the standard protocol following bolus administration of intravenous contrast.  CONTRAST:  100 cc Omnipaque 300.  COMPARISON:  Pelvic ultrasound February 03, 2014 and  CT of the abdomen and pelvis October 06, 2013  FINDINGS: LUNG BASES: Included view of the lung bases are clear. Visualized heart and pericardium are unremarkable. Partially imaged median sternotomy with fractured wires.  SOLID ORGANS: The spleen, pancreas and adrenal glands are unremarkable. Scattered sub cm hypodensities in the bilateral lobes the liver likely reflects cysts, the liver is otherwise unremarkable. 3 mm gallstone without CT findings of acute cholecystitis.  GASTROINTESTINAL TRACT: The stomach is unremarkable. Mild small bowel wall thickening extending to the terminal ileum, with superimposed multi segmental colonic wall thickening and pericolonic inflammation most evident and the ascending colon. Normal appendix.  KIDNEYS/ URINARY TRACT: Kidneys are orthotopic, demonstrating symmetric enhancement. No nephrolithiasis, hydronephrosis or solid renal masses. The unopacified ureters are normal in course and caliber. Urinary bladder is partially distended and unremarkable.  PERITONEUM/RETROPERITONEUM: Small amount of free fluid in the pelvis without abscess nor intraperitoneal free air. Aortoiliac vessels are normal in course and caliber. No lymphadenopathy by CT size criteria. Internal reproductive organs are unremarkable.  SOFT TISSUE/OSSEOUS STRUCTURES: RIGHT greater than LEFT sacroiliac sclerosis and apparent erosions on the RIGHT.  IMPRESSION: Multi segmental small and large bowel inflammation concerning for acute Crohn's exacerbation. No bowel  perforation or obstruction.  Apparent RIGHT greater than LEFT sacroiliitis which can be associated with inflammatory bowel disease.  Cholelithiasis without CT findings of acute cholecystitis.   Electronically Signed   By: Elon Alas   On: 05/07/2014 00:52    IMPRESSION:  -28 year old female with reported history of Crohn's disease and CT scan showing multi-segmental small and large bowel inflammation concerning for Crohn's disease.  Now with complaints of sudden onset diarrhea, left sided abdominal pain, and nausea/vomiting.  Symptoms already resolved so this is questionable a Crohn's flare vs infectious gastroenteritis, but she does have the bowel inflammation on CT scan along with sacroiliitis, which indicates possible Crohn's disease.  She is unsure if she had colonoscopy but it says in her chart that she had "active ileitis" on colonoscopy.  Was dismissed from [facility] where she used to get her care.  PLAN: -Will discuss with Dr. Carlean Purl.  I have requested her records from [facility].  Patient is feeling much better and would like to be discharged today because she has 4 children at home, one which is a newborn.  She had been on Pentasa in the past so we could consider restarting that for now until we get records and get things sorted out as far as diagnosis, etc, which would then have to be done as an outpatient in our office. ? Low dose prednisone taper.  **Discussed with Dr. Carlean Purl.  Will start her on a course of Flagyl 500 mg BID and cipro 500 mg BID for 10 days, which can treat both Crohn's disease and infectious sources.  Will not start her on any Crohn's therapy until she proves that she is going to be compliant and follow-up as an outpatient.  Appt made in our office for follow-up on 05/20/2014 at 10:30 am.   ZEHR, Andres D.  05/07/2014, 1:44 PM  Pager number [phone]  [facility] Attending  I have also seen and assessed the patient and agree with the above note. I gave her  handouts about Crohn's to improve understanding of disease and natural hx, need for treatment. I suppose it is still possible that she does not have Crohn's disease - current scenario could be a gastroenteritis/colitis  I think she will need a repeat colonoscopy as an  outpatient.  Gatha Mayer, MD, [facility] [phone] (pager) 05/07/2014 7:42 PM

## 2014-05-07 NOTE — Progress Notes (Signed)
Patient seen and examined. Admitted after midnight secondary to intractable N/V and LLQ pain. Had hx of chron's disease and most likely presenting with flare of it. Patient is doing better after IV analgesics and antiemetics. Patient also received 125mg  of solumedrol in ED. Currently able to tolerates clear and full liquid diet. GI has seen patient and recommended to treat with cipro and flagyl X 10 days. Please referred to H&P made by Dr. Hal Hope for further info/details.  Plan: -advance diet -start cipro and flagyl for 10 days -continue hydration and PRN analgesics and antiemetics -if tolerate diet and abx's Po will d/c home tomorrow -follow up on 05/20/14 with GI as an outpatient for further evaluation and maintenance therapy  Barton Dubois [phone]

## 2014-05-07 NOTE — ED Provider Notes (Signed)
Medical screening examination/treatment/procedure(s) were performed by non-physician practitioner and as supervising physician I was immediately available for consultation/collaboration.   EKG Interpretation None        Orpah Greek, MD 05/07/14 [phone]

## 2014-05-07 NOTE — H&P (Addendum)
[facility] History and Physical  [name] JKK:[id number] DOB: 08-Sep-1985 DOA: 05/06/2014  Referring physician: ER physician. PCP: Philis Fendt, MD   Chief Complaint: Nausea vomiting with abdominal pain.  On my exam patient was lethargic from pain medications.  HPI: Melanie [name] is a 28 y.o. female with history of Crohn's disease presents to the ER because of abdominal pain and nausea and vomiting. Patient stated that she has been having nausea vomiting and abdominal pain for last few days and pain is mostly in the left lower quadrant. She also states she has occasional diarrhea. Patient states she has history of Crohn's but has not taken her medication for last 1 year because of recent pregnancy. In the ER CAT scan abdomen and pelvis was done which shows multiple segments of large and small bowel inflammation concerning for Crohn's disease. Patient otherwise denies any fever chills or any recent use of antibiotics.  Review of Systems: As presented in the history of presenting illness, rest negative.  Past Medical History  Diagnosis Date  . Anemia   . Gestational diabetes mellitus in pregnancy   . Cardiac tumor   . Crohn's disease   . History of genital warts   . Gestational diabetes    Past Surgical History  Procedure Laterality Date  . Cardiac surgery      at 28 years old  . Tubal ligation Bilateral 02/15/2014    Procedure: POST PARTUM TUBAL LIGATION;  Surgeon: Farrel Gobble. Harrington Challenger, MD;  Location: [facility];  Service: Gynecology;  Laterality: Bilateral;   Social History:  reports that she has never smoked. She has never used smokeless tobacco. She reports that she drinks alcohol. She reports that she uses illicit drugs (Marijuana) about once per week. Where does patient live at home. Can patient participate in ADLs? Yes.  No Known Allergies  Family History:  Family History  Problem Relation Age of Onset  . Hypertension Mother   . Heart Problems Mother    tumor, open heart surgery  . Miscarriages / [place] Mother   . Heart Problems Maternal Aunt     tumor  . Diabetes Father   . Alcohol abuse Father   . Diabetes Paternal Grandmother   . Diabetes Maternal Grandmother       Prior to Admission medications   Not on File    Physical Exam: Filed Vitals:   05/06/14 2330 05/07/14 0055 05/07/14 0100 05/07/14 0130  BP: 127/77 120/79 108/63 94/51  Pulse: 49 72 82 80  Temp:  97.9 F (36.6 C)    TempSrc:  Oral    Resp:  18    SpO2: 97% 100% 94% 98%     General:  Moderately built and nourished.  Eyes: Anicteric no pallor.  ENT: No discharge from ears eyes nose mouth.  Neck: No mass felt.  Cardiovascular: S1-S2 heard.  Respiratory: No rhonchi or crepitations.  Abdomen: Soft nontender bowel sounds present. No guarding or rigidity.  Skin: No rash.  Musculoskeletal: No edema.  Psychiatric: Patient is mildly lethargic from pain medications.  Neurologic: Patient is mildly lethargic from pain medications but follows commands and moves all extremities.  Labs on Admission:  Basic Metabolic Panel:  Recent Labs Lab 05/06/14 2219  NA 139  K 3.2*  CL 103  CO2 19  GLUCOSE 195*  BUN 9  CREATININE 0.70  CALCIUM 9.4   Liver Function Tests:  Recent Labs Lab 05/06/14 2219  AST 29  ALT 13  ALKPHOS 98  BILITOT 0.3  PROT 7.3  ALBUMIN 3.8    Recent Labs Lab 05/06/14 2219  LIPASE 24   No results found for this basename: AMMONIA,  in the last 168 hours CBC:  Recent Labs Lab 05/06/14 2219  WBC 7.9  NEUTROABS 4.7  HGB 12.1  HCT 37.1  MCV 89.4  PLT 264   Cardiac Enzymes: No results found for this basename: CKTOTAL, CKMB, CKMBINDEX, TROPONINI,  in the last 168 hours  BNP (last 3 results) No results found for this basename: PROBNP,  in the last 8760 hours CBG: No results found for this basename: GLUCAP,  in the last 168 hours  Radiological Exams on Admission: Ct Abdomen Pelvis W Contrast  05/07/2014    CLINICAL DATA:  Lower abdominal pain with nausea, vomiting and diarrhea beginning today at 1930 hours. History of Crohn's disease. Recent pregnancy.  EXAM: CT ABDOMEN AND PELVIS WITH CONTRAST  TECHNIQUE: Multidetector CT imaging of the abdomen and pelvis was performed using the standard protocol following bolus administration of intravenous contrast.  CONTRAST:  100 cc Omnipaque 300.  COMPARISON:  Pelvic ultrasound February 03, 2014 and CT of the abdomen and pelvis October 06, 2013  FINDINGS: LUNG BASES: Included view of the lung bases are clear. Visualized heart and pericardium are unremarkable. Partially imaged median sternotomy with fractured wires.  SOLID ORGANS: The spleen, pancreas and adrenal glands are unremarkable. Scattered sub cm hypodensities in the bilateral lobes the liver likely reflects cysts, the liver is otherwise unremarkable. 3 mm gallstone without CT findings of acute cholecystitis.  GASTROINTESTINAL TRACT: The stomach is unremarkable. Mild small bowel wall thickening extending to the terminal ileum, with superimposed multi segmental colonic wall thickening and pericolonic inflammation most evident and the ascending colon. Normal appendix.  KIDNEYS/ URINARY TRACT: Kidneys are orthotopic, demonstrating symmetric enhancement. No nephrolithiasis, hydronephrosis or solid renal masses. The unopacified ureters are normal in course and caliber. Urinary bladder is partially distended and unremarkable.  PERITONEUM/RETROPERITONEUM: Small amount of free fluid in the pelvis without abscess nor intraperitoneal free air. Aortoiliac vessels are normal in course and caliber. No lymphadenopathy by CT size criteria. Internal reproductive organs are unremarkable.  SOFT TISSUE/OSSEOUS STRUCTURES: RIGHT greater than LEFT sacroiliac sclerosis and apparent erosions on the RIGHT.  IMPRESSION: Multi segmental small and large bowel inflammation concerning for acute Crohn's exacerbation. No bowel perforation or obstruction.   Apparent RIGHT greater than LEFT sacroiliitis which can be associated with inflammatory bowel disease.  Cholelithiasis without CT findings of acute cholecystitis.   Electronically Signed   By: Elon Alas   On: 05/07/2014 00:52     Assessment/Plan Principal Problem:   Crohn's colitis Active Problems:   Colitis   1. Abdominal pain and nausea and vomiting with history of Crohn's disease most likely Crohn's exacerbation - patient did receive 1 dose of Solu-Medrol 125 mg in the ER. Patient at this time became clear liquid diet and gentle hydration. Pain relief medications. Consult GI for further recommendations in a.m. 2. Sacroiliitis per the CAT scan - probably related to Crohn's disease. 3. History of gestational diabetes - check hemoglobin A1c.    Code Status: Full code.  Family Communication: None.  Disposition Plan: Admit to inpatient.    KAKRAKANDY,ARSHAD N. [facility] Pager [phone].  If 7PM-7AM, please contact night-coverage [email or web address] Password [email or web address] 05/07/2014, 2:49 AM

## 2014-05-08 MED ORDER — ONDANSETRON HCL 4 MG PO TABS: 4.0000 mg | ORAL_TABLET | Freq: Four times a day (QID) | ORAL | Status: DC | PRN

## 2014-05-08 MED ORDER — CIPROFLOXACIN HCL 500 MG PO TABS: 500.0000 mg | ORAL_TABLET | Freq: Two times a day (BID) | ORAL | Status: DC

## 2014-05-08 MED ORDER — HYDROCODONE-ACETAMINOPHEN 5-325 MG PO TABS: 1.0000 | ORAL_TABLET | Freq: Four times a day (QID) | ORAL | Status: DC | PRN

## 2014-05-08 MED ORDER — METRONIDAZOLE 500 MG PO TABS: 500.0000 mg | ORAL_TABLET | Freq: Three times a day (TID) | ORAL | Status: DC

## 2014-05-08 MED ORDER — POTASSIUM CHLORIDE CRYS ER 20 MEQ PO TBCR
40.0000 meq | EXTENDED_RELEASE_TABLET | Freq: Once | ORAL | Status: DC
  Filled 2014-05-08: qty 2

## 2014-05-08 NOTE — Discharge Summary (Signed)
Physician Discharge Summary  Melanie [name] PNT:[id number] DOB: 01/03/1986 DOA: 05/06/2014  PCP: Philis Fendt, MD  Admit date: 05/06/2014 Discharge date: 05/08/2014  Recommendations for Outpatient Follow-up:  1. Pt will need to follow up with PCP in 2-3 weeks post discharge 2. Please obtain BMP to evaluate electrolytes and kidney function 3. Please also check CBC to evaluate Hg and Hct levels 4. Continue Cipro and Flagyl upon discharge for 10 more days per GI recommendations  5. Appt made in GI office for follow-up on 05/20/2014 at 10:30 am.   Discharge Diagnoses:  Principal Problem:   Crohn's disease Active Problems:   Colitis  Discharge Condition: Stable  Diet recommendation: Heart healthy diet discussed in details   History of present illness:  28 y.o. female with history of Crohn's disease presented to the ER because of abdominal pain, nausea and vomiting. Patient stated that she has been having nausea vomiting and abdominal pain for last few days and pain is mostly in the left lower quadrant. She also states she has occasional diarrhea. Patient states she has history of Crohn's but has not taken her medication for last 1 year because of recent pregnancy. In the ER CAT scan abdomen and pelvis was done which shows multiple segments of large and small bowel inflammation concerning for Crohn's disease. Patient otherwise denies any fever chills or any recent use of antibiotics.   Hospital Course:  Principal Problem:   Crohn's disease vs colitis  - feeling much better and would like to be discharged today because she has 4 children at home, one which is a newborn - she had been on Pentasa in the past so could consider restarting in an outpatient setting  - continue cipro and flagyl upon discharge for 10 more days per GI recommendations  - Appt made in GI office for follow-up on 05/20/2014 at 10:30 am.   Procedures/Studies: Ct Abdomen Pelvis W Contrast  05/07/2014   Multi  segmental small and large bowel inflammation concerning for acute Crohn's exacerbation. No bowel perforation or obstruction.  Apparent RIGHT greater than LEFT sacroiliitis which can be associated with inflammatory bowel disease.  Cholelithiasis without CT findings of acute cholecystitis.    Consultations:  GI  Antibiotics:  Cipro  Flagyl   Discharge Exam: Filed Vitals:   05/08/14 0605  BP: 113/52  Pulse: 54  Temp: 98.2 F (36.8 C)  Resp: 18   Filed Vitals:   05/07/14 0630 05/07/14 1424 05/07/14 2245 05/08/14 0605  BP: 120/68 115/46 115/45 113/52  Pulse: 80 53 61 54  Temp: 98.7 F (37.1 C) 98.5 F (36.9 C) 98.3 F (36.8 C) 98.2 F (36.8 C)  TempSrc: Oral Oral Oral Oral  Resp: 18 18 18 18   Height:      Weight:      SpO2: 99% 98% 99% 100%    General: Pt is alert, follows commands appropriately, not in acute distress Cardiovascular: Regular rate and rhythm, S1/S2 +, no murmurs, no rubs, no gallops Respiratory: Clear to auscultation bilaterally, no wheezing, no crackles, no rhonchi Abdominal: Soft, non tender, non distended, bowel sounds +, no guarding Extremities: no edema, no cyanosis, pulses palpable bilaterally DP and PT Neuro: Grossly nonfocal  Discharge Instructions  Discharge Instructions   Diet - low sodium heart healthy    Complete by:  As directed             Medication List         ciprofloxacin 500 MG tablet  Commonly known as:  CIPRO  Take 1 tablet (500 mg total) by mouth 2 (two) times daily.     HYDROcodone-acetaminophen 5-325 MG per tablet  Commonly known as:  NORCO  Take 1 tablet by mouth every 6 (six) hours as needed for moderate pain.     metroNIDAZOLE 500 MG tablet  Commonly known as:  FLAGYL  Take 1 tablet (500 mg total) by mouth 3 (three) times daily.     ondansetron 4 MG tablet  Commonly known as:  ZOFRAN  Take 1 tablet (4 mg total) by mouth every 6 (six) hours as needed for nausea.           Follow-up Information   Follow  up with ZEHR, Kili D., PA-C On 05/20/2014. (10:30 am)    Specialty:  Gastroenterology   Contact information:   [place] [address] [phone]       Schedule an appointment as soon as possible for a visit with Philis Fendt, MD.   Specialty:  Internal Medicine   Contact information:   [place] [id number] [phone]       Follow up with Faye Ramsay, MD. (As needed, If symptoms worsen)    Specialty:  Internal Medicine   Contact information:   [address] [place]  [address] [phone]        The results of significant diagnostics from this hospitalization (including imaging, microbiology, ancillary and laboratory) are listed below for reference.     Microbiology: No results found for this or any previous visit (from the past 240 hour(s)).   Labs: Basic Metabolic Panel:  Recent Labs Lab 05/06/14 2219 05/07/14 0405  NA 139 138  K 3.2* 3.6*  CL 103 105  CO2 19 21  GLUCOSE 195* 115*  BUN 9 5*  CREATININE 0.70 0.56  CALCIUM 9.4 8.8   Liver Function Tests:  Recent Labs Lab 05/06/14 2219 05/07/14 0405  AST 29 27  ALT 13 12  ALKPHOS 98 96  BILITOT 0.3 0.4  PROT 7.3 6.9  ALBUMIN 3.8 3.6    Recent Labs Lab 05/06/14 2219  LIPASE 24   CBC:  Recent Labs Lab 05/06/14 2219 05/07/14 0405  WBC 7.9 7.0  NEUTROABS 4.7 5.9  HGB 12.1 11.6*  HCT 37.1 35.6*  MCV 89.4 89.9  PLT 264 254   SIGNED: Time coordinating discharge: Over 30 minutes  Faye Ramsay, MD  [facility] 05/08/2014, 8:53 AM Pager [phone]  If 7PM-7AM, please contact night-coverage [email or web address] Password [email or web address]

## 2014-05-08 NOTE — Discharge Instructions (Signed)
Abdominal Pain  Many things can cause abdominal pain. Usually, abdominal pain is not caused by a disease and will improve without treatment. It can often be observed and treated at home. Your health care provider will do a physical exam and possibly order blood tests and X-rays to help determine the seriousness of your pain. However, in many cases, more time must pass before a clear cause of the pain can be found. Before that point, your health care provider may not know if you need more testing or further treatment.  HOME CARE INSTRUCTIONS   Monitor your abdominal pain for any changes. The following actions may help to alleviate any discomfort you are experiencing:  · Only take over-the-counter or prescription medicines as directed by your health care provider.  · Do not take laxatives unless directed to do so by your health care provider.  · Try a clear liquid diet (broth, tea, or water) as directed by your health care provider. Slowly move to a bland diet as tolerated.  SEEK MEDICAL CARE IF:  · You have unexplained abdominal pain.  · You have abdominal pain associated with nausea or diarrhea.  · You have pain when you urinate or have a bowel movement.  · You experience abdominal pain that wakes you in the night.  · You have abdominal pain that is worsened or improved by eating food.  · You have abdominal pain that is worsened with eating fatty foods.  · You have a fever.  SEEK IMMEDIATE MEDICAL CARE IF:   · Your pain does not go away within 2 hours.  · You keep throwing up (vomiting).  · Your pain is felt only in portions of the abdomen, such as the right side or the left lower portion of the abdomen.  · You pass bloody or black tarry stools.  MAKE SURE YOU:  · Understand these instructions.    · Will watch your condition.    · Will get help right away if you are not doing well or get worse.    Document Released: 04/07/2005 Document Revised: 07/03/2013 Document Reviewed: 03/07/2013  ExitCare® Patient Information  ©2015 [employer]. This information is not intended to replace advice given to you by your health care provider. Make sure you discuss any questions you have with your health care provider.  Abdominal Pain  Many things can cause abdominal pain. Usually, abdominal pain is not caused by a disease and will improve without treatment. It can often be observed and treated at home. Your health care provider will do a physical exam and possibly order blood tests and X-rays to help determine the seriousness of your pain. However, in many cases, more time must pass before a clear cause of the pain can be found. Before that point, your health care provider may not know if you need more testing or further treatment.  HOME CARE INSTRUCTIONS   Monitor your abdominal pain for any changes. The following actions may help to alleviate any discomfort you are experiencing:  · Only take over-the-counter or prescription medicines as directed by your health care provider.  · Do not take laxatives unless directed to do so by your health care provider.  · Try a clear liquid diet (broth, tea, or water) as directed by your health care provider. Slowly move to a bland diet as tolerated.  SEEK MEDICAL CARE IF:  · You have unexplained abdominal pain.  · You have abdominal pain associated with nausea or diarrhea.  · You have pain when you   urinate or have a bowel movement.  · You experience abdominal pain that wakes you in the night.  · You have abdominal pain that is worsened or improved by eating food.  · You have abdominal pain that is worsened with eating fatty foods.  · You have a fever.  SEEK IMMEDIATE MEDICAL CARE IF:   · Your pain does not go away within 2 hours.  · You keep throwing up (vomiting).  · Your pain is felt only in portions of the abdomen, such as the right side or the left lower portion of the abdomen.  · You pass bloody or black tarry stools.  MAKE SURE YOU:  · Understand these instructions.    · Will watch your condition.     · Will get help right away if you are not doing well or get worse.    Document Released: 04/07/2005 Document Revised: 07/03/2013 Document Reviewed: 03/07/2013  ExitCare® Patient Information ©2015 [employer]. This information is not intended to replace advice given to you by your health care provider. Make sure you discuss any questions you have with your health care provider.

## 2014-05-08 NOTE — Progress Notes (Signed)
Patient discharge home with family, alert and oriented, discharge instructions given, patient verbalize understanding of discharge instructions given, My Chart access declined at this time, patient in stable condition at this time

## 2014-05-13 ENCOUNTER — Encounter (HOSPITAL_COMMUNITY): Payer: Self-pay | Admitting: Internal Medicine

## 2014-05-20 ENCOUNTER — Encounter: Payer: Self-pay | Admitting: Gastroenterology

## 2014-05-20 ENCOUNTER — Ambulatory Visit (INDEPENDENT_AMBULATORY_CARE_PROVIDER_SITE_OTHER): Payer: Medicaid Other | Admitting: Gastroenterology

## 2014-05-20 VITALS — BP 114/60 | HR 76 | Ht 65.25 in | Wt 158.2 lb

## 2014-05-20 DIAGNOSIS — K50919 Crohn's disease, unspecified, with unspecified complications: Secondary | ICD-10-CM

## 2014-05-20 NOTE — Patient Instructions (Signed)
You have been scheduled for a colonoscopy. Please follow written instructions given to you at your visit today.  Please pick up your prep kit at the pharmacy within the next 1-3 days. If you use inhalers (even only as needed), please bring them with you on the day of your procedure. Your physician has requested that you go to www.startemmi.com and enter the access code given to you at your visit today.( SENT TO YOUR E-MAIL) This web site gives a general overview about your procedure. However, you should still follow specific instructions given to you by our office regarding your preparation for the procedure.  

## 2014-05-20 NOTE — Progress Notes (Signed)
     05/20/2014 Melanie [name] [id number] August 02, 1985   History of Present Illness:  This is a 28 year old female who is here for hospital follow-up.  She was recently seen in consult at [facility] for sudden onset abdominal pain and nausea/vomiting with diarrhea.  She has a history of Crohn's disease and used to follow with [facility] but was discharged due to failure to keep appts.  She has not taken her medication for last 1 year because of recent pregnancy. The only medication that she was on was Pentasa as far as she can recall. She cannot remember having a colonoscopy, but there is a note in her history that says she had "active ileitis" on biopsies from [facility]. In the ER CAT scan abdomen and pelvis was done which showed multiple segments of large and small bowel inflammation concerning for Crohn's disease. She was given one dose of IV solumedrol 125 mg along with antiemetics.  She felt much better within less than 24 hours and was discharged due to the improvement in her symptoms and her request because she had 4 children at home, one of which was a newborn.  She was placed on a course of cipro and flagyl and given a follow-up appt to discuss repeat colonoscopy.  She says that she completed the cipro and flagyl.  Feels well.  Denies abdominal pain, nausea, vomiting, blood in stools, and diarrhea.  Has one very soft bowel movement each day.   Current Medications, Allergies, Past Medical History, Past Surgical History, Family History and Social History were reviewed in [employer] record.   Physical Exam: BP 114/60 mmHg  Pulse 76  Ht 5' 5.25" (1.657 m)  Wt 158 lb 4 oz (71.782 kg)  BMI 26.14 kg/m2 General: Well developed black female in no acute distress Head: Normocephalic and atraumatic Eyes:  Sclerae anicteric, conjunctiva pink  Ears: Normal auditory acuity Lungs: Clear throughout to auscultation Heart: Regular rate and rhythm Abdomen: Soft,  non-distended.  Normal bowel sounds.  Non-tender. Rectal:  Will be done at the time of colonoscopy. Musculoskeletal: Symmetrical with no gross deformities  Extremities: No edema  Neurological: Alert oriented x 4, grossly non-focal Psychological:  Alert and cooperative. Normal mood and affect  Assessment and Recommendations: -28 year old female with reported history of Crohn's disease and recent CT scan showing multi-segmental small and large bowel inflammation concerning for Crohn's disease. Was treated with Cipro and flagyl for complaints of sudden onset diarrhea, left sided abdominal pain, and nausea/vomiting. Symptoms already resolved quickly so she questionably had a Crohn's flare vs infectious gastroenteritis; she did have the bowel inflammation on CT scan along with sacroiliitis, which indicates possible Crohn's disease.  *Will schedule for repeat colonoscopy with Dr. Carlean Purl.  The risks, benefits, and alternatives were discussed with the patient and she consents to proceed. *Will try to obtain records from [facility]. *Will not start any treatment for now until after the colonoscopy since she is feeling well.

## 2014-05-23 NOTE — Progress Notes (Signed)
Agree with Ms. Zehr's management.  Carl E. Gessner, MD, FACG  

## 2014-07-17 ENCOUNTER — Encounter: Payer: Medicaid Other | Admitting: Internal Medicine

## 2014-07-17 ENCOUNTER — Telehealth: Payer: Self-pay | Admitting: Internal Medicine

## 2014-07-17 NOTE — Telephone Encounter (Signed)
No charge. 

## 2014-09-26 ENCOUNTER — Encounter: Payer: Medicaid Other | Admitting: Internal Medicine

## 2014-12-06 ENCOUNTER — Telehealth: Payer: Self-pay | Admitting: Gastroenterology

## 2014-12-06 ENCOUNTER — Telehealth: Payer: Self-pay | Admitting: Cardiology

## 2014-12-06 NOTE — Telephone Encounter (Signed)
Pt stated she had a fall 2 weeks ago and her back has been hurting since then.  Pt stated she has been using a heating pad but has not taken any medications to help with the pain. She thought that the pain would have been gone by now but it isn't and there is nothing that seems to make it better. Pt asked to contact her PCP to be evaluated. Pt agrees with plan and no additional questions at this time.

## 2014-12-06 NOTE — Telephone Encounter (Signed)
Pt c/o of Chest Pain: STAT if CP now or developed within 24 hours  1. Are you having CP right now? Yes, worse when laying down  2. Are you experiencing any other symptoms (ex. SOB, nausea, vomiting, sweating)? no  3. How long have you been experiencing CP? 2 weeks ago- after falling down  4. Is your CP continuous or coming and going? Comes/goes  5. Have you taken Nitroglycerin? no ?

## 2014-12-06 NOTE — Telephone Encounter (Signed)
LM on Vmail to CB to sch'd an OV

## 2014-12-06 NOTE — Telephone Encounter (Signed)
Patient should be scheduled for OV prior to rescheduling her procedure since she has cancelled and no showed several times.

## 2014-12-10 ENCOUNTER — Ambulatory Visit: Payer: Medicaid Other | Admitting: Cardiology

## 2015-01-06 ENCOUNTER — Other Ambulatory Visit: Payer: Self-pay

## 2015-01-28 ENCOUNTER — Ambulatory Visit: Payer: Medicaid Other | Admitting: Internal Medicine

## 2015-01-28 ENCOUNTER — Telehealth: Payer: Self-pay | Admitting: Internal Medicine

## 2015-01-28 NOTE — Telephone Encounter (Signed)
Thanks for the information, no charge.

## 2015-02-12 ENCOUNTER — Emergency Department (HOSPITAL_COMMUNITY)
Admission: EM | Admit: 2015-02-12 | Discharge: 2015-02-12 | Disposition: A | Discharge status: Home / Self Care | Payer: Medicaid Other | Attending: Emergency Medicine | Admitting: Emergency Medicine

## 2015-02-12 ENCOUNTER — Encounter (HOSPITAL_COMMUNITY): Payer: Self-pay | Admitting: Emergency Medicine

## 2015-02-12 DIAGNOSIS — Z8742 Personal history of other diseases of the female genital tract: Secondary | ICD-10-CM | POA: Diagnosis not present

## 2015-02-12 DIAGNOSIS — Z862 Personal history of diseases of the blood and blood-forming organs and certain disorders involving the immune mechanism: Secondary | ICD-10-CM | POA: Insufficient documentation

## 2015-02-12 DIAGNOSIS — Y929 Unspecified place or not applicable: Secondary | ICD-10-CM | POA: Diagnosis not present

## 2015-02-12 DIAGNOSIS — X58XXXA Exposure to other specified factors, initial encounter: Secondary | ICD-10-CM | POA: Insufficient documentation

## 2015-02-12 DIAGNOSIS — Y939 Activity, unspecified: Secondary | ICD-10-CM | POA: Diagnosis not present

## 2015-02-12 DIAGNOSIS — H5711 Ocular pain, right eye: Secondary | ICD-10-CM | POA: Diagnosis present

## 2015-02-12 DIAGNOSIS — Z8719 Personal history of other diseases of the digestive system: Secondary | ICD-10-CM | POA: Insufficient documentation

## 2015-02-12 DIAGNOSIS — Y999 Unspecified external cause status: Secondary | ICD-10-CM | POA: Diagnosis not present

## 2015-02-12 DIAGNOSIS — S0501XA Injury of conjunctiva and corneal abrasion without foreign body, right eye, initial encounter: Secondary | ICD-10-CM | POA: Diagnosis not present

## 2015-02-12 DIAGNOSIS — Z8632 Personal history of gestational diabetes: Secondary | ICD-10-CM | POA: Diagnosis not present

## 2015-02-12 MED ORDER — POLYMYXIN B-TRIMETHOPRIM 10000-0.1 UNIT/ML-% OP SOLN: 1.0000 [drp] | Freq: Four times a day (QID) | OPHTHALMIC | Status: DC

## 2015-02-12 MED ORDER — TRAMADOL HCL 50 MG PO TABS: 50.0000 mg | ORAL_TABLET | Freq: Four times a day (QID) | ORAL | Status: DC | PRN

## 2015-02-12 MED ORDER — FLUORESCEIN SODIUM 1 MG OP STRP
1.0000 | ORAL_STRIP | Freq: Once | OPHTHALMIC | Status: AC
  Administered 2015-02-12: 1 via OPHTHALMIC
  Filled 2015-02-12: qty 1

## 2015-02-12 MED ORDER — TETRACAINE HCL 0.5 % OP SOLN
2.0000 [drp] | Freq: Once | OPHTHALMIC | Status: AC
  Administered 2015-02-12: 2 [drp] via OPHTHALMIC
  Filled 2015-02-12: qty 2
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
## 2015-02-12 NOTE — ED Notes (Signed)
Patient was able to see the bottom line on the eye chart(20/13) with left eye. Patient reported blurriness with right eye and stated she couldn't see"can't do it" with both eyes. PA Heather notified

## 2015-02-12 NOTE — ED Provider Notes (Signed)
CSN: [id number]     Arrival date & time 02/12/15  0952 History   First MD Initiated Contact with Patient 02/12/15 1002     Chief Complaint  Patient presents with  . Eye Pain     (Consider location/radiation/quality/duration/timing/severity/associated sxs/prior Treatment) HPI Comments: Patient presents today with right eye pain and redness since last night.  She states that her daughter scratched her eye with her finger nail.  She has taken Tylenol for the pain without relief.  She reports associated photophobia and tearing of the eye.  She denies changes in vision.  Denies fever or chills.  Denies any thick discharge from the eye.  She does not wear contact lenses.    Patient is a 29 y.o. female presenting with eye pain. The history is provided by the patient.  Eye Pain Pertinent negatives include no chills or fever.    Past Medical History  Diagnosis Date  . Anemia   . Gestational diabetes mellitus in pregnancy   . Cardiac tumor   . Crohn's disease   . History of genital warts   . Gestational diabetes    Past Surgical History  Procedure Laterality Date  . Cardiac surgery      at 29 years old  . Tubal ligation Bilateral 02/15/2014    Procedure: POST PARTUM TUBAL LIGATION;  Surgeon: Farrel Gobble. Harrington Challenger, MD;  Location: [facility];  Service: Gynecology;  Laterality: Bilateral;  . Colonoscopy w/ biopsies  08/2012    active ileitis, normal colon (bxs of both) Dr. Oletta Lamas   Family History  Problem Relation Age of Onset  . Hypertension Mother   . Heart Problems Mother     tumor, open heart surgery  . Miscarriages / [place] Mother   . Heart Problems Maternal Aunt     tumor  . Diabetes Father   . Alcohol abuse Father   . Diabetes Paternal Grandmother   . Diabetes Maternal Grandmother   . Colon cancer Neg Hx   . Colon polyps Neg Hx   . Kidney disease Neg Hx   . Esophageal cancer Neg Hx    History  Substance Use Topics  . Smoking status: Never Smoker   . Smokeless tobacco: Never  Used  . Alcohol Use: 0.0 oz/week    0 Standard drinks or equivalent per week     Comment: occasionally.   OB History    Gravida Para Term Preterm AB TAB SAB Ectopic Multiple Living   4 4 2 2      4      Review of Systems  Constitutional: Negative for fever and chills.  HENT: Negative for facial swelling.   Eyes: Positive for photophobia, pain and redness.      Allergies  Review of patient's allergies indicates no known allergies.  Home Medications   Prior to Admission medications   Medication Sig Start Date End Date Taking? Authorizing Provider  HYDROcodone-acetaminophen (NORCO) 5-325 MG per tablet Take 1 tablet by mouth every 6 (six) hours as needed for moderate pain. 05/08/14   Theodis Blaze, MD  ondansetron (ZOFRAN) 4 MG tablet Take 1 tablet (4 mg total) by mouth every 6 (six) hours as needed for nausea. 05/08/14   Theodis Blaze, MD   BP 121/85 mmHg  Pulse 89  Temp(Src) 98.8 F (37.1 C) (Oral)  Resp 18  SpO2 99% Physical Exam  Constitutional: She appears well-developed and well-nourished.  HENT:  Head: Normocephalic and atraumatic.  Eyes: EOM and lids are normal. Pupils are equal,  round, and reactive to light. Lids are everted and swept, no foreign bodies found. Right eye exhibits no discharge and no exudate. No foreign body present in the right eye. Right conjunctiva is injected.  Slit lamp exam:      The right eye shows corneal abrasion and fluorescein uptake. The right eye shows no foreign body.  Neck: Normal range of motion.  Cardiovascular: Normal rate, regular rhythm and normal heart sounds.   Pulmonary/Chest: Effort normal and breath sounds normal.  Neurological: She is alert.  Skin: Skin is warm and dry.  Psychiatric: She has a normal mood and affect.  Nursing note and vitals reviewed.   ED Course  Procedures (including critical care time) Labs Review Labs Reviewed - No data to display  Imaging Review No results found.   EKG Interpretation None       MDM   Final diagnoses:  None   Patient with signs and symptoms consistent with Corneal Abrasion.  Patient started on antibiotic drops.  Stable for discharge.  Return precautions given.    Hyman Bible, PA-C 02/12/15 1333  Orlie Dakin, MD 02/12/15 [phone]

## 2015-02-12 NOTE — ED Notes (Signed)
AVS explained in detail, knows to follow up with opthalmology. No other c/c. Given specific AVS details.

## 2015-02-12 NOTE — ED Notes (Signed)
Bed: WTR5 Expected date:  Expected time:  Means of arrival:  Comments: 

## 2015-02-12 NOTE — Discharge Instructions (Signed)
Do not drive or operate heavy machinery for 4 hours after taking pain medication.

## 2015-02-18 ENCOUNTER — Emergency Department (HOSPITAL_COMMUNITY)
Admission: EM | Admit: 2015-02-18 | Discharge: 2015-02-18 | Disposition: A | Discharge status: Home / Self Care | Payer: Medicaid Other | Attending: Emergency Medicine | Admitting: Emergency Medicine

## 2015-02-18 ENCOUNTER — Emergency Department (HOSPITAL_COMMUNITY): Payer: Medicaid Other

## 2015-02-18 ENCOUNTER — Encounter (HOSPITAL_COMMUNITY): Payer: Self-pay | Admitting: Emergency Medicine

## 2015-02-18 DIAGNOSIS — H05011 Cellulitis of right orbit: Secondary | ICD-10-CM | POA: Diagnosis not present

## 2015-02-18 DIAGNOSIS — E876 Hypokalemia: Secondary | ICD-10-CM | POA: Insufficient documentation

## 2015-02-18 DIAGNOSIS — Z8603 Personal history of neoplasm of uncertain behavior: Secondary | ICD-10-CM | POA: Insufficient documentation

## 2015-02-18 DIAGNOSIS — Z8632 Personal history of gestational diabetes: Secondary | ICD-10-CM | POA: Diagnosis not present

## 2015-02-18 DIAGNOSIS — Z862 Personal history of diseases of the blood and blood-forming organs and certain disorders involving the immune mechanism: Secondary | ICD-10-CM | POA: Diagnosis not present

## 2015-02-18 DIAGNOSIS — H5711 Ocular pain, right eye: Secondary | ICD-10-CM | POA: Diagnosis present

## 2015-02-18 DIAGNOSIS — L03213 Periorbital cellulitis: Secondary | ICD-10-CM

## 2015-02-18 DIAGNOSIS — Z8619 Personal history of other infectious and parasitic diseases: Secondary | ICD-10-CM | POA: Insufficient documentation

## 2015-02-18 DIAGNOSIS — Z8719 Personal history of other diseases of the digestive system: Secondary | ICD-10-CM | POA: Diagnosis not present

## 2015-02-18 LAB — I-STAT CHEM 8, ED
BUN: 4 mg/dL — AB (ref 6–20)
CHLORIDE: 102 mmol/L (ref 101–111)
Calcium, Ion: 1.19 mmol/L (ref 1.12–1.23)
Creatinine, Ser: 0.7 mg/dL (ref 0.44–1.00)
Glucose, Bld: 108 mg/dL — ABNORMAL HIGH (ref 65–99)
HCT: 38 % (ref 36.0–46.0)
Hemoglobin: 12.9 g/dL (ref 12.0–15.0)
POTASSIUM: 2.8 mmol/L — AB (ref 3.5–5.1)
Sodium: 140 mmol/L (ref 135–145)
TCO2: 24 mmol/L (ref 0–100)

## 2015-02-18 MED ORDER — TETRACAINE HCL 0.5 % OP SOLN
1.0000 [drp] | Freq: Once | OPHTHALMIC | Status: AC
  Administered 2015-02-18: 1 [drp] via OPHTHALMIC
  Filled 2015-02-18: qty 2

## 2015-02-18 MED ORDER — IOHEXOL 300 MG/ML  SOLN
100.0000 mL | Freq: Once | INTRAMUSCULAR | Status: AC | PRN
  Administered 2015-02-18: 100 mL via INTRAVENOUS

## 2015-02-18 MED ORDER — POTASSIUM CHLORIDE CRYS ER 20 MEQ PO TBCR
40.0000 meq | EXTENDED_RELEASE_TABLET | Freq: Once | ORAL | Status: AC
  Administered 2015-02-18: 40 meq via ORAL
  Filled 2015-02-18: qty 2

## 2015-02-18 MED ORDER — ONDANSETRON HCL 4 MG/2ML IJ SOLN
4.0000 mg | Freq: Once | INTRAMUSCULAR | Status: AC
  Administered 2015-02-18: 4 mg via INTRAVENOUS
  Filled 2015-02-18: qty 2

## 2015-02-18 MED ORDER — OXYCODONE-ACETAMINOPHEN 5-325 MG PO TABS: 1.0000 | ORAL_TABLET | Freq: Four times a day (QID) | ORAL | Status: DC | PRN

## 2015-02-18 MED ORDER — CLINDAMYCIN HCL 150 MG PO CAPS
150.0000 mg | ORAL_CAPSULE | Freq: Four times a day (QID) | ORAL | Status: DC
Start: 2015-02-18 — End: 2015-12-25

## 2015-02-18 MED ORDER — CLINDAMYCIN HCL 300 MG PO CAPS
300.0000 mg | ORAL_CAPSULE | Freq: Once | ORAL | Status: AC
  Administered 2015-02-18: 300 mg via ORAL
  Filled 2015-02-18: qty 1

## 2015-02-18 MED ORDER — MORPHINE SULFATE 4 MG/ML IJ SOLN
4.0000 mg | Freq: Once | INTRAMUSCULAR | Status: AC
  Administered 2015-02-18: 4 mg via INTRAVENOUS
  Filled 2015-02-18: qty 1

## 2015-02-18 MED ORDER — KETOROLAC TROMETHAMINE 30 MG/ML IJ SOLN
30.0000 mg | Freq: Once | INTRAMUSCULAR | Status: AC
  Administered 2015-02-18: 30 mg via INTRAVENOUS

## 2015-02-18 MED ORDER — POTASSIUM CHLORIDE CRYS ER 20 MEQ PO TBCR: 40.0000 meq | EXTENDED_RELEASE_TABLET | Freq: Every day | ORAL | Status: DC

## 2015-02-18 MED ORDER — FLUORESCEIN SODIUM 1 MG OP STRP
1.0000 | ORAL_STRIP | Freq: Once | OPHTHALMIC | Status: AC
  Administered 2015-02-18: 23:00:00 via OPHTHALMIC
  Filled 2015-02-18: qty 1

## 2015-02-18 MED ORDER — OXYCODONE-ACETAMINOPHEN 5-325 MG PO TABS
1.0000 | ORAL_TABLET | Freq: Once | ORAL | Status: AC
  Administered 2015-02-18: 1 via ORAL
  Filled 2015-02-18: qty 1

## 2015-02-18 MED ORDER — KETOROLAC TROMETHAMINE 30 MG/ML IJ SOLN
30.0000 mg | Freq: Once | INTRAMUSCULAR | Status: DC
  Filled 2015-02-18: qty 1

## 2015-02-18 NOTE — Discharge Instructions (Signed)
Periorbital Cellulitis Periorbital cellulitis is a common infection that can affect the eyelid and the soft tissues that surround the eyeball. The infection may also affect the structures that produce and drain tears. It does not affect the eyeball itself. Natural tissue barriers usually prevent the spread of this infection to the eyeball and other deeper areas of the eye socket.  CAUSES  Bacterial infection.  Long-term (chronic) sinus infections.  An object (foreign body) stuck behind the eye.  An injury that goes through the eyelid tissues.  An injury that causes an infection, such as an insect sting.  Fracture of the bone around the eye.  Infections which have spread from the eyelid or other structures around the eye.  Bite wounds.  Inflammation or infection of the lining membranes of the brain (meningitis).  An infection in the blood (septicemia).  Dental infection (abscess).  Viral infection (this is rare). SYMPTOMS Symptoms usually come on suddenly.  Pain in the eye.  Red, hot, and swollen eyelids and possibly cheeks. The swelling is sometimes bad enough that the eyelids cannot open. Some infections make the eyelids look purple.  Fever and feeling generally ill.  Pain when touching the area around the eye. DIAGNOSIS  Periorbital cellulitis can be diagnosed from an eye exam. In severe cases, your caregiver might suggest:  Blood tests.  Imaging tests (such as a CT scan) to examine the sinuses and the area around and behind the eyeball. TREATMENT If your caregiver feels that you do not have any signs of serious infection, treatment may include:  Antibiotics.  Nasal decongestants to reduce swelling.  Referral to a dentist if it is suspected that the infection was caused by a prior tooth infection.  Examination every day to make sure the problem is improving. HOME CARE INSTRUCTIONS  Take your antibiotics as directed. Finish them even if you start to  feel better.  Some pain is normal with this condition. Take pain medicine as directed by your caregiver. Only take pain medicines approved by your caregiver.  It is important to drink fluids. Drink enough water and fluids to keep your urine clear or pale yellow.  Do not smoke.  Rest and get plenty of sleep.  Mild or moderate fevers generally have no long-term effects and often do not require treatment.  If your caregiver has given you a follow-up appointment, it is very important to keep that appointment. Your caregiver will need to make sure that the infection is getting better. It is important to check that a more serious infection is not developing. SEEK IMMEDIATE MEDICAL CARE IF:  Your eyelids become more painful, red, warm, or swollen.  You develop double vision or your vision becomes blurred or worsens in any way.  You have trouble moving your eyes.  The eye looks like it is popping out (proptosis).  You develop a severe headache, severe neck pain, or neck stiffness.  You develop repeated vomiting.  You have a fever or persistent symptoms for more than 72 hours.  You have a fever and your symptoms suddenly get worse. MAKE SURE YOU:  Understand these instructions.  Will watch your condition.  Will get help right away if you are not doing well or get worse. Document Released: 07/31/2010 Document Revised: 09/20/2011 Document Reviewed: 07/31/2010 [facility] Patient Information 2015 [place], [place]. This information is not intended to replace advice given to you by your health care provider. Make sure you discuss any questions you have with your health care provider. Today,  you had a CT scan of your orbit that shows that you do not have an infection behind the eye, but in front of including the lids.  Your eye was again examined you do not have any abrasions or ulcers to the eyeball itself.  You have been given an anti-biotic to treat the cellulitis and a referral to the  ophthalmologist for further evaluation  Please call and make an appointment in the next 1-2 days. When I checked her lab work  It seems that you do have a low potassium level.  You been given a prescription for  Supplement.  Please take this as directed and she'll all tablets have been consumed.  You have also been given a dietary outline of foods that are higher in potassium content

## 2015-02-18 NOTE — ED Notes (Signed)
Pt reports being poked in right eye, swelling and redness to same, reports blurred vision. Seen recently for same. Given rx for antibiotics and pain. Left pain medication in cousins car.

## 2015-02-18 NOTE — ED Provider Notes (Signed)
CSN: [id number]     Arrival date & time 02/18/15  1934 History  This chart was scribed for Junius Creamer, NP, working with Merrily Pew, MD by Julien Nordmann, ED Scribe. This patient was seen in room WTR6/WTR6 and the patient's care was started at 8:52 PM.    Chief Complaint  Patient presents with  . Eye Pain     The history is provided by the patient. No language interpreter was used.   HPI Comments: Melanie [name] is a 29 y.o. female who presents to the Emergency Department complaining of constant, gradual worsening right eye pain onset last week. She has associated swelling, redness and blurred vision in that eye. Pt reports she was poked in the right eye and was seen by the ED. She was diagnosed with a corneal abrasion. She was given antibiotic drops and continue to use them. Pt has been taking OTC tylenol to alleviate the pain with no relief.   Past Medical History  Diagnosis Date  . Anemia   . Gestational diabetes mellitus in pregnancy   . Cardiac tumor   . Crohn's disease   . History of genital warts   . Gestational diabetes    Past Surgical History  Procedure Laterality Date  . Cardiac surgery      at 29 years old  . Tubal ligation Bilateral 02/15/2014    Procedure: POST PARTUM TUBAL LIGATION;  Surgeon: Farrel Gobble. Harrington Challenger, MD;  Location: [facility];  Service: Gynecology;  Laterality: Bilateral;  . Colonoscopy w/ biopsies  08/2012    active ileitis, normal colon (bxs of both) Dr. Oletta Lamas   Family History  Problem Relation Age of Onset  . Hypertension Mother   . Heart Problems Mother     tumor, open heart surgery  . Miscarriages / [place] Mother   . Heart Problems Maternal Aunt     tumor  . Diabetes Father   . Alcohol abuse Father   . Diabetes Paternal Grandmother   . Diabetes Maternal Grandmother   . Colon cancer Neg Hx   . Colon polyps Neg Hx   . Kidney disease Neg Hx   . Esophageal cancer Neg Hx    History  Substance Use Topics  . Smoking status: Never Smoker   .  Smokeless tobacco: Never Used  . Alcohol Use: 0.0 oz/week    0 Standard drinks or equivalent per week     Comment: occasionally.   OB History    Gravida Para Term Preterm AB TAB SAB Ectopic Multiple Living   4 4 2 2      4      Review of Systems  HENT: Positive for ear pain.   All other systems reviewed and are negative.     Allergies  Review of patient's allergies indicates no known allergies.  Home Medications   Prior to Admission medications   Medication Sig Start Date End Date Taking? Authorizing Provider  traMADol (ULTRAM) 50 MG tablet Take 1 tablet (50 mg total) by mouth every 6 (six) hours as needed. 02/12/15  Yes Heather Laisure, PA-C  trimethoprim-polymyxin b (POLYTRIM) ophthalmic solution Place 1 drop into the right eye every 6 (six) hours. Use for 5 days 02/12/15  Yes Heather Laisure, PA-C  clindamycin (CLEOCIN) 150 MG capsule Take 1 capsule (150 mg total) by mouth every 6 (six) hours. 02/18/15   Junius Creamer, NP  HYDROcodone-acetaminophen (NORCO) 5-325 MG per tablet Take 1 tablet by mouth every 6 (six) hours as needed for moderate pain. Patient  not taking: Reported on 02/18/2015 05/08/14   Theodis Blaze, MD  ondansetron (ZOFRAN) 4 MG tablet Take 1 tablet (4 mg total) by mouth every 6 (six) hours as needed for nausea. Patient not taking: Reported on 02/18/2015 05/08/14   Theodis Blaze, MD  oxyCODONE-acetaminophen (PERCOCET/ROXICET) 5-325 MG per tablet Take 1 tablet by mouth every 6 (six) hours as needed for severe pain. 02/18/15   Junius Creamer, NP  potassium chloride SA (K-DUR,KLOR-CON) 20 MEQ tablet Take 2 tablets (40 mEq total) by mouth daily. 02/18/15   Junius Creamer, NP   Triage vitals: BP 113/73 mmHg  Pulse 68  Temp(Src) 98.9 F (37.2 C) (Oral)  Resp 15  SpO2 99%  LMP 02/16/2015  Breastfeeding? No Physical Exam  Constitutional: She is oriented to person, place, and time. She appears well-developed and well-nourished. No distress.  HENT:  Head: Normocephalic and atraumatic.   Mouth/Throat: Oropharynx is clear and moist.  Eyes: EOM are normal. Pupils are equal, round, and reactive to light. Right eye exhibits no discharge, no exudate and no hordeolum. Right conjunctiva is injected. Right conjunctiva has no hemorrhage.  Slit lamp exam:      The right eye shows no corneal abrasion, no corneal flare, no corneal ulcer, no foreign body, no fluorescein uptake and no anterior chamber bulge.  Neck: Normal range of motion. Neck supple.  Cardiovascular: Normal rate, regular rhythm and normal heart sounds.   Pulmonary/Chest: Effort normal and breath sounds normal. No respiratory distress.  Musculoskeletal: Normal range of motion. She exhibits no edema.  Neurological: She is alert and oriented to person, place, and time. No sensory deficit.  Skin: Skin is warm and dry.  Psychiatric: She has a normal mood and affect. Her behavior is normal.  Nursing note and vitals reviewed.   ED Course  Procedures  DIAGNOSTIC STUDIES: Oxygen Saturation is 99% on RA, normal by my interpretation.  COORDINATION OF CARE:  8:55 PM Discussed treatment plan with pt at bedside and pt agreed to plan.  Labs Review Labs Reviewed  I-STAT CHEM 8, ED - Abnormal; Notable for the following:    Potassium 2.8 (*)    BUN 4 (*)    Glucose, Bld 108 (*)    All other components within normal limits    Imaging Review Ct Orbits W/cm  02/18/2015   CLINICAL DATA:  Initial evaluation for acute gradual constant and worsening right eye pain for 1 week. Also with associated swelling, redness, and blurry vision.  EXAM: CT ORBITS WITH CONTRAST  TECHNIQUE: Multidetector CT imaging of the orbits was performed following the bolus administration of intravenous contrast.  CONTRAST:  166mL OMNIPAQUE IOHEXOL 300 MG/ML  SOLN  COMPARISON:  Prior study from 12/07/2006  FINDINGS: There is asymmetric swelling with hazy stranding within the preseptal soft tissues about the right orbit, most consistent with preseptal cellulitis.  Overlying eyelid and conjunctiva are swollen. Slight asymmetric fullness of the anterior chamber on the right. No postseptal extension of inflammatory changes into the bony right orbit. Intraconal and extraconal fat are preserved. Extra-ocular muscles and optic nerves are normal. Globes themselves are intact. No loculated or rim enhancing collection to suggest abscess.  No acute abnormality about the left orbit.  Visualized paranasal sinuses are clear.  Visualized portions of the brain are unremarkable.  No acute osseous abnormality within the visualized face.  IMPRESSION: Findings most consistent with acute right preseptal periorbital cellulitis. No abscess or drainable fluid collection. No postseptal extension.   Electronically Signed   By:  Jeannine Boga M.D.   On: 02/18/2015 22:43     EKG Interpretation None    Patient pain has been controlled with IV morphine, IV Toradol and by mouth Percocet.  She will be discharged home with by mouth Percocet.  Potassium supplementation as well as clindamycin.  She's been given a referral to the ophthalmologist to be seen in the next 1-2 days.  MDM   Final diagnoses:  Hypokalemia  Periorbital cellulitis of right eye    I personally performed the services described in this documentation, which was scribed in my presence. The recorded information has been reviewed and is accurate.  Junius Creamer, NP 02/18/15 [id number]  Merrily Pew, MD 02/20/15 [phone]

## 2015-02-18 NOTE — ED Provider Notes (Signed)
Medical screening examination/treatment/procedure(s) were conducted as a shared visit with non-physician practitioner(s) and myself.  I personally evaluated the patient during the encounter.  29 year old female who is treated for corneal abrasion a week ago with initial improvement who now has surrounding erythema. Difficult to tell if she has any proptosis however she has no pain with extraocular movements however the concern is the nature she has no septal cellulitis so CT scan was done which was negative. We'll treat her for preseptal cellulitis and discharge.   EKG Interpretation None        Merrily Pew, MD 02/20/15 [phone]

## 2015-02-25 IMAGING — US US FETAL BPP W/O NONSTRESS
2 series · 13 of 22 positions shown · non-contrast
Comparison: none

OBSTETRICS REPORT
                      (Signed Final 02/03/2014 [DATE])

Service(s) Provided
 US OB TRANSVAGINAL                                    76817.0
Indications
 Crohn's Disease
 History of genetic / anatomic abnormality - [REDACTED]5.23
 had hereditary cardiac tumor--surgery at age 16;
 probably Carney complex, AD inheritance -can
 have multiple atrial myxomas; mod-severe TR
 Poor obstetric history: Previous gestational
 diabetes
 Cigarette smoker
 Poor obstetric history: Previous preterm delivery @
 36 weeks
 Diabetes - Gestational, A2 (medication controlled)
 Vaginal bleeding, unknown etiology
 Non-reactive NST, FHR decelerations (one
 prolonged, one late)
 Assess fetal well-being
 Pelvic / Abdominal pain (Contractions)
Fetal Evaluation
 Num Of Fetuses:    1
 Fetal Heart Rate:  147                         bpm
 Cardiac Activity:  Observed
 Presentation:      Cephalic
 Placenta:          Posterior, above cervical
                    os
 Amniotic Fluid
 AFI FV:      Subjectively increased
                                             Larg Pckt:     9.9  cm
Biophysical Evaluation
 Amniotic F.V:   Pocket => 2 cm two         F. Tone:        Observed
                 planes
 F. Movement:    Observed                   Score:          [DATE]
 F. Breathing:   Observed
Gestational Age
 Best:          33w 3d    Det. By:   U/S    (09/23/13)        EDD:   03/21/14
Cervix Uterus Adnexa
 Cervical Length:   1.8       cm
 Cervix:       Appears funnelled, see comments.
Impression
INDICATION: 27 yr old [id number] at 00w0d with likely partial
 abruption for BPP secondary to nonreactive NST and
 evaluate placental location. Remote read.

[Series 1: us fetal bpp w/o nonstress · non-contrast · 12 acquisitions, 7 frames shown (1 of 2)]
[im 1/12]
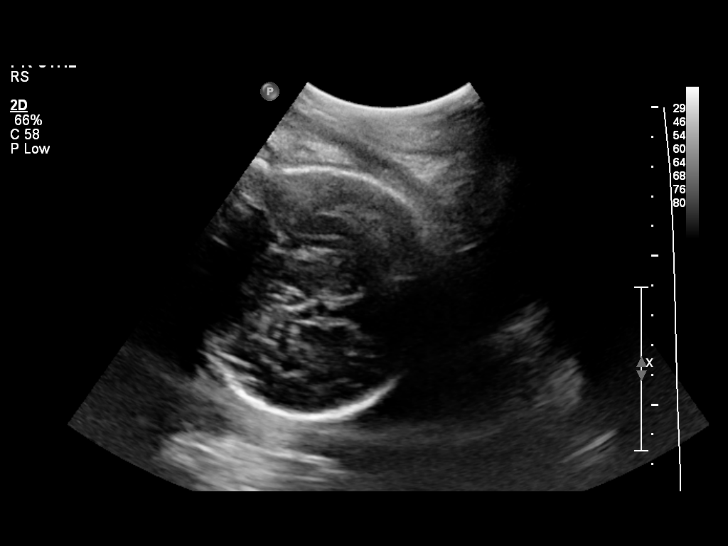
[im 3/12]
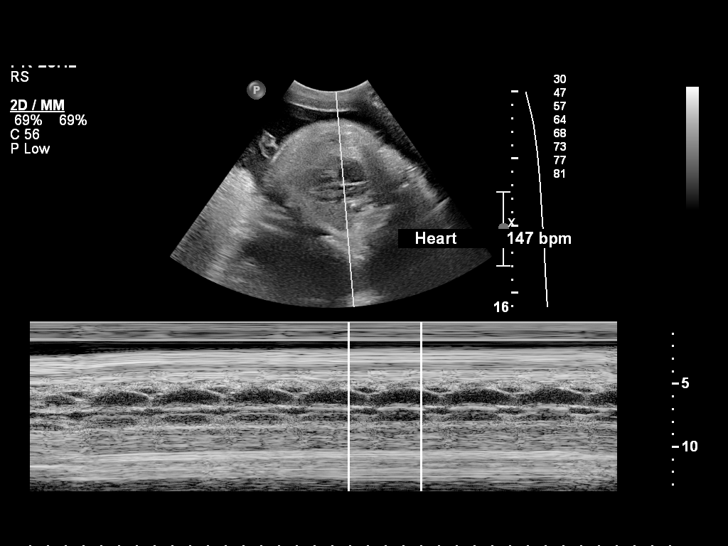
[im 5/12]
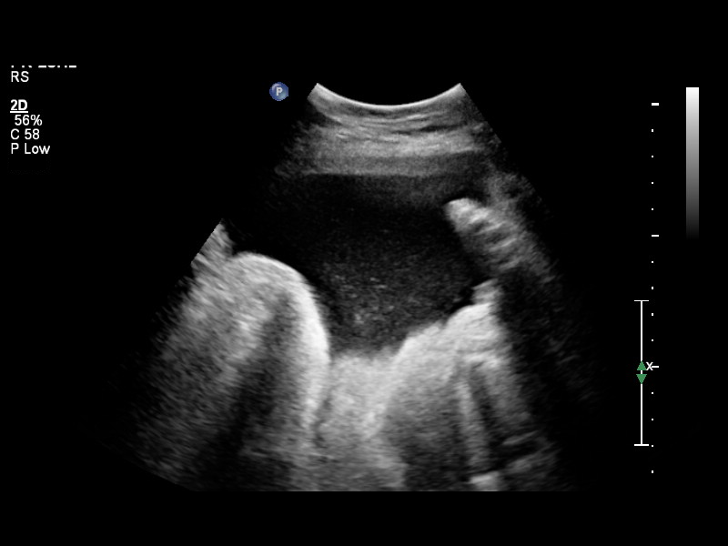
[im 6/12]
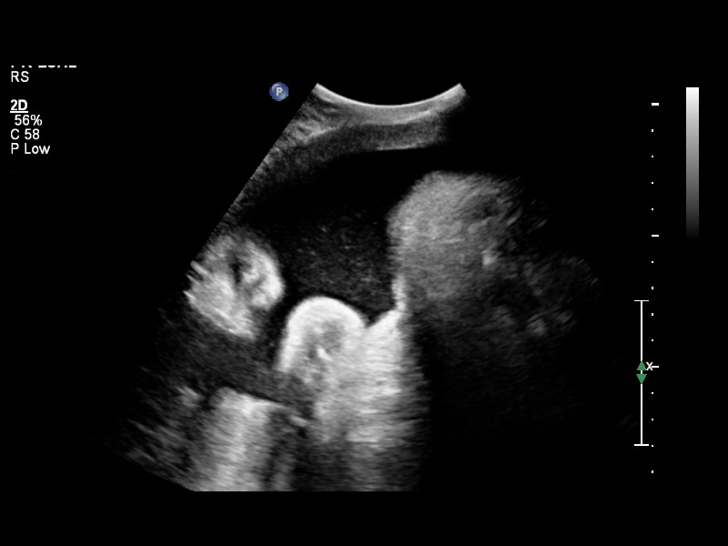
[im 8/12]
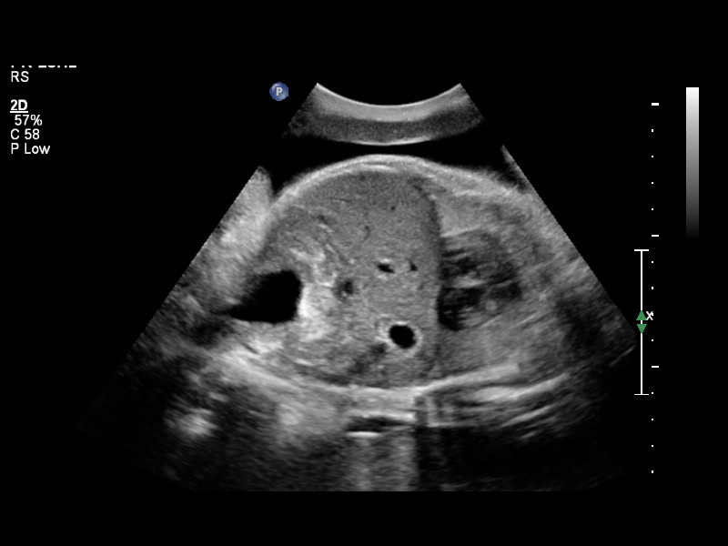
[im 10/12]
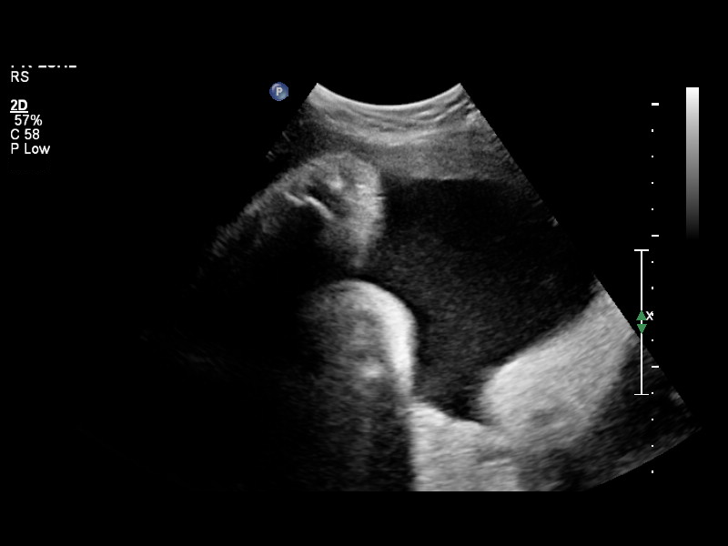
[im 12/12]
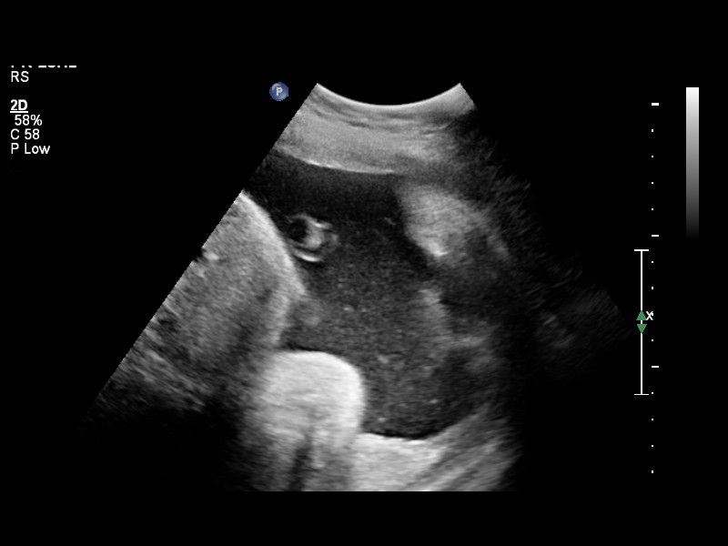

[Series 1: us fetal bpp w/o nonstress · non-contrast · 10 acquisitions, 6 frames shown (2 of 2)]
[im 1/10]
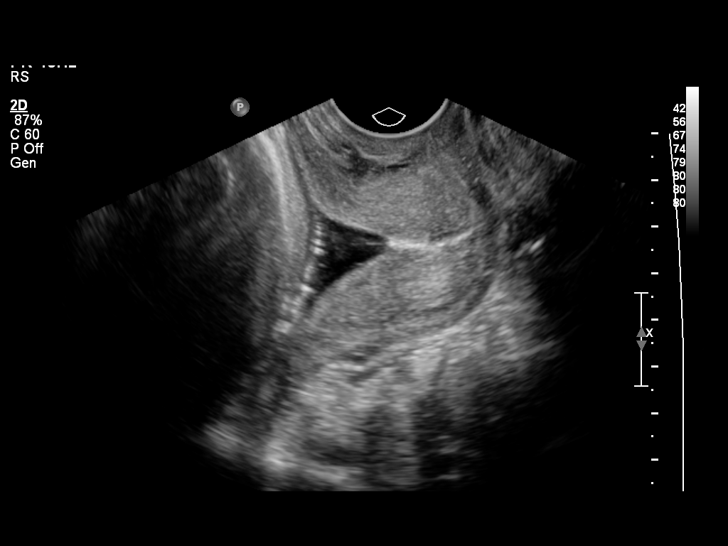
[im 3/10]
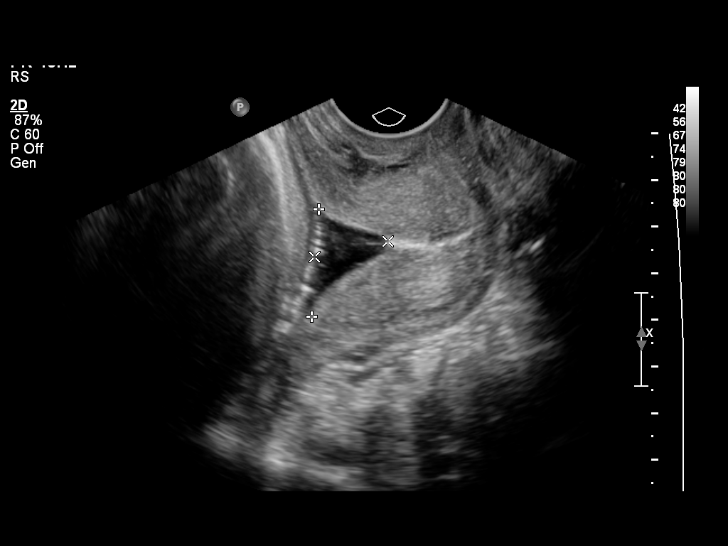
[im 5/10]
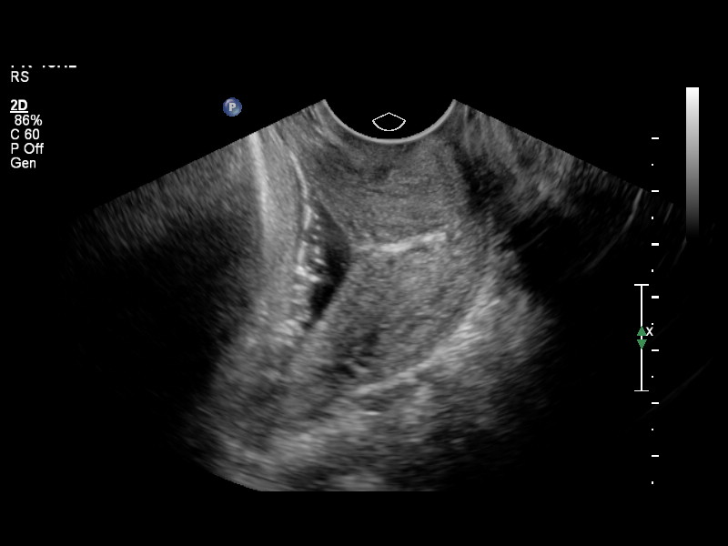
[im 6/10]
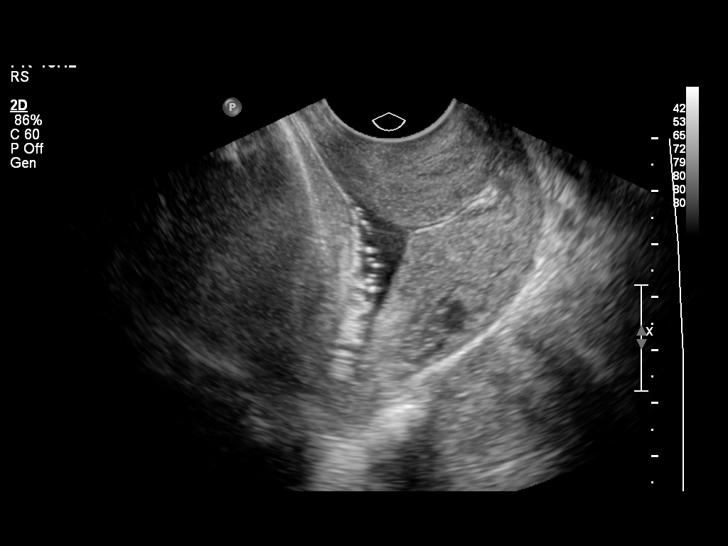
[im 8/10]
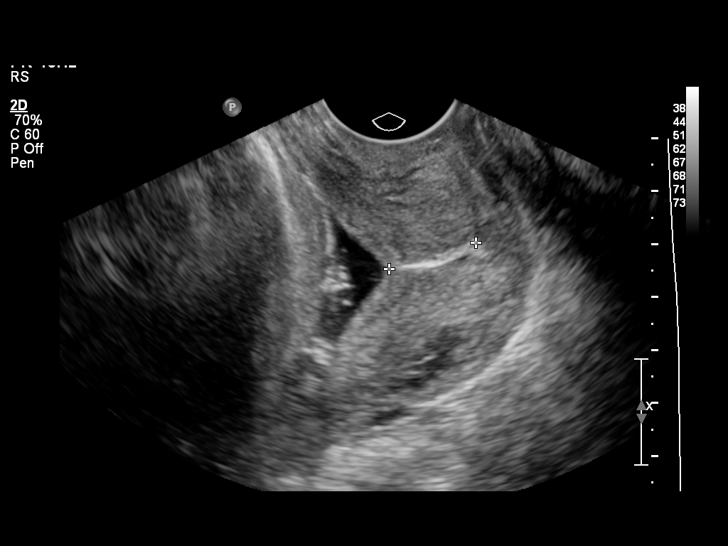
[im 10/10]
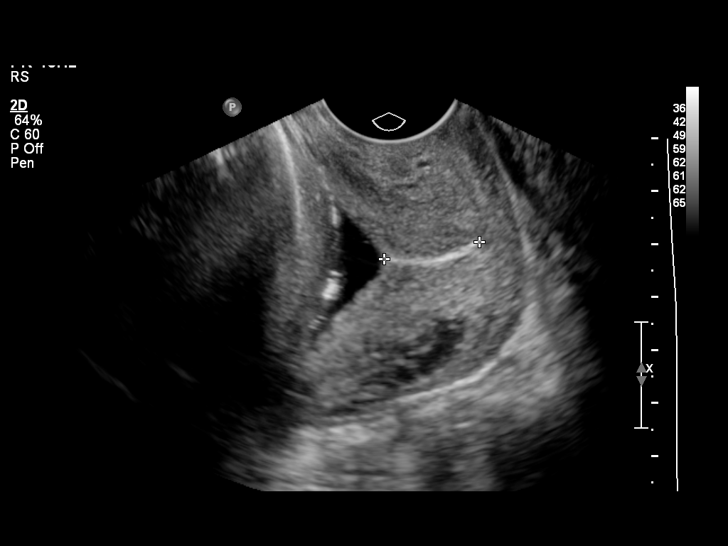

[13 of 22 positions shown; findings below may reference images not displayed]

FINDINGS: 1. Single intrauterine pregnancy.
 2. Posterior placenta without evidence of previa. No bleed is
 seen on today's exam.
 3. Amniotic fluid volume appears increased; the maximum
 vertical pocket of amniotic fluid is 9.9cm
 4. The amniotic fluid has echogenic debris.
 5. Transvaginal cervical length is shortened to 1.7cm; there is
 funneling seen at the internal cervical os.
 6. Normal biophysical profile of [DATE].
Recommendations

 1. Possible partial abruption vs subchorionic hemorrhage:
 - previously counseled
 - Hak Koo/Kamy Mayor
 - recommend continuous fetal monitoring given recent bleed
 and recent decels seen
 - recommend serial fetal growth
 - recommend delivery for nonreassuring fetal status
 (prolonged decels, or repetitive late decels) or other clinical
 indication (heavy bleeding)
 - bleeding precautions
 2. Short cervix:
 - Hak Koo/Kamy Mayor
 - given bleeding and s/p steroids would not recommend
 tocolysis
 - preterm labor precautions
 3. Increased amniotic fluid:
 - recommend antenatal testing

 Discussed with Dr. Tiger

## 2015-02-27 ENCOUNTER — Ambulatory Visit: Payer: Medicaid Other | Admitting: Cardiology

## 2015-03-07 ENCOUNTER — Ambulatory Visit: Payer: Medicaid Other | Admitting: Internal Medicine

## 2015-04-11 ENCOUNTER — Ambulatory Visit: Payer: Medicaid Other | Admitting: Cardiology

## 2015-04-24 ENCOUNTER — Encounter: Payer: Self-pay | Admitting: Cardiology

## 2015-06-02 ENCOUNTER — Emergency Department (HOSPITAL_COMMUNITY)
Admission: EM | Admit: 2015-06-02 | Discharge: 2015-06-02 | Disposition: A | Discharge status: Home / Self Care | Payer: Medicaid Other | Attending: Emergency Medicine | Admitting: Emergency Medicine

## 2015-06-02 ENCOUNTER — Encounter (HOSPITAL_COMMUNITY): Payer: Self-pay

## 2015-06-02 DIAGNOSIS — Z8619 Personal history of other infectious and parasitic diseases: Secondary | ICD-10-CM | POA: Insufficient documentation

## 2015-06-02 DIAGNOSIS — Z9889 Other specified postprocedural states: Secondary | ICD-10-CM | POA: Diagnosis not present

## 2015-06-02 DIAGNOSIS — Z8719 Personal history of other diseases of the digestive system: Secondary | ICD-10-CM | POA: Insufficient documentation

## 2015-06-02 DIAGNOSIS — Z792 Long term (current) use of antibiotics: Secondary | ICD-10-CM | POA: Diagnosis not present

## 2015-06-02 DIAGNOSIS — Z8632 Personal history of gestational diabetes: Secondary | ICD-10-CM | POA: Diagnosis not present

## 2015-06-02 DIAGNOSIS — Z862 Personal history of diseases of the blood and blood-forming organs and certain disorders involving the immune mechanism: Secondary | ICD-10-CM | POA: Insufficient documentation

## 2015-06-02 DIAGNOSIS — R112 Nausea with vomiting, unspecified: Secondary | ICD-10-CM | POA: Diagnosis present

## 2015-06-02 LAB — I-STAT CHEM 8, ED
BUN: 10 mg/dL (ref 6–20)
CREATININE: 0.6 mg/dL (ref 0.44–1.00)
Calcium, Ion: 1.2 mmol/L (ref 1.12–1.23)
Chloride: 106 mmol/L (ref 101–111)
GLUCOSE: 164 mg/dL — AB (ref 65–99)
HCT: 37 % (ref 36.0–46.0)
Hemoglobin: 12.6 g/dL (ref 12.0–15.0)
Potassium: 3.6 mmol/L (ref 3.5–5.1)
Sodium: 141 mmol/L (ref 135–145)
TCO2: 20 mmol/L (ref 0–100)

## 2015-06-02 LAB — CBC WITH DIFFERENTIAL/PLATELET
BASOS PCT: 0 %
Basophils Absolute: 0 10*3/uL (ref 0.0–0.1)
EOS PCT: 2 %
Eosinophils Absolute: 0.2 10*3/uL (ref 0.0–0.7)
HEMATOCRIT: 35.2 % — AB (ref 36.0–46.0)
Hemoglobin: 11.2 g/dL — ABNORMAL LOW (ref 12.0–15.0)
Lymphocytes Relative: 27 %
Lymphs Abs: 2.5 10*3/uL (ref 0.7–4.0)
MCH: 29.2 pg (ref 26.0–34.0)
MCHC: 31.8 g/dL (ref 30.0–36.0)
MCV: 91.7 fL (ref 78.0–100.0)
MONO ABS: 0.5 10*3/uL (ref 0.1–1.0)
MONOS PCT: 5 %
NEUTROS ABS: 6 10*3/uL (ref 1.7–7.7)
Neutrophils Relative %: 66 %
PLATELETS: 245 10*3/uL (ref 150–400)
RBC: 3.84 MIL/uL — ABNORMAL LOW (ref 3.87–5.11)
RDW: 15.2 % (ref 11.5–15.5)
WBC: 9.2 10*3/uL (ref 4.0–10.5)

## 2015-06-02 MED ORDER — ONDANSETRON HCL 4 MG/2ML IJ SOLN
4.0000 mg | Freq: Once | INTRAMUSCULAR | Status: AC
  Administered 2015-06-02: 4 mg via INTRAVENOUS
  Filled 2015-06-02: qty 2

## 2015-06-02 MED ORDER — ONDANSETRON 4 MG PO TBDP: 4.0000 mg | ORAL_TABLET | Freq: Three times a day (TID) | ORAL | Status: DC | PRN

## 2015-06-02 MED ORDER — SODIUM CHLORIDE 0.9 % IV SOLN
Freq: Once | INTRAVENOUS | Status: AC
  Administered 2015-06-02: 02:00:00 via INTRAVENOUS

## 2015-06-02 NOTE — ED Notes (Signed)
Bed: [id number] Expected date:  Expected time:  Means of arrival:  Comments: EMS 36F emesis x 1

## 2015-06-02 NOTE — Discharge Instructions (Signed)
Nausea and Vomiting  Nausea means you feel sick to your stomach. Throwing up (vomiting) is a reflex where stomach contents come out of your mouth.  HOME CARE   · Take medicine as told by your doctor.  · Do not force yourself to eat. However, you do need to drink fluids.  · If you feel like eating, eat a normal diet as told by your doctor.    Eat rice, wheat, potatoes, bread, lean meats, yogurt, fruits, and vegetables.    Avoid high-fat foods.  · Drink enough fluids to keep your pee (urine) clear or pale yellow.  · Ask your doctor how to replace body fluid losses (rehydrate). Signs of body fluid loss (dehydration) include:    Feeling very thirsty.    Dry lips and mouth.    Feeling dizzy.    Dark pee.    Peeing less than normal.    Feeling confused.    Fast breathing or heart rate.  GET HELP RIGHT AWAY IF:   · You have blood in your throw up.  · You have black or bloody poop (stool).  · You have a bad headache or stiff neck.  · You feel confused.  · You have bad belly (abdominal) pain.  · You have chest pain or trouble breathing.  · You do not pee at least once every 8 hours.  · You have cold, clammy skin.  · You keep throwing up after 24 to 48 hours.  · You have a fever.  MAKE SURE YOU:   · Understand these instructions.  · Will watch your condition.  · Will get help right away if you are not doing well or get worse.     This information is not intended to replace advice given to you by your health care provider. Make sure you discuss any questions you have with your health care provider.     Document Released: 12/15/2007 Document Revised: 09/20/2011 Document Reviewed: 11/27/2010  Elsevier Interactive Patient Education ©2016 [employer]

## 2015-06-02 NOTE — ED Notes (Signed)
Pt transported from home by EMS with c/o n/v/d x 1 in last hour. VSS.

## 2015-06-02 NOTE — ED Provider Notes (Signed)
CSN: [id number]     Arrival date & time 06/02/15  0107 History   First MD Initiated Contact with Patient 06/02/15 0119     Chief Complaint  Patient presents with  . Emesis     (Consider location/radiation/quality/duration/timing/severity/associated sxs/prior Treatment) HPI Comments: This 29 year old with a history of Crohn's disease who reports approximately 2 hours before arrival in the emergency department, she developed nausea and vomiting.  She had one bowel movement today that did not have any blood or mucus in it.  She states that she has been off her Crohn's medication for a while because she has trouble getting to her appointments.  She did not take any antiemetics at home  Patient is a 29 y.o. female presenting with vomiting. The history is provided by the patient.  Emesis Severity:  Mild Duration:  2 hours Timing:  Intermittent Number of daily episodes:  3 Quality:  Bilious material Chronicity:  Recurrent Recent urination:  Normal Relieved by:  None tried Worsened by:  Nothing tried Ineffective treatments:  None tried Associated symptoms: no abdominal pain, no chills, no diarrhea and no fever     Past Medical History  Diagnosis Date  . Anemia   . Gestational diabetes mellitus in pregnancy   . Cardiac tumor   . Crohn's disease ([place])   . History of genital warts   . Gestational diabetes    Past Surgical History  Procedure Laterality Date  . Cardiac surgery      at 29 years old  . Tubal ligation Bilateral 02/15/2014    Procedure: POST PARTUM TUBAL LIGATION;  Surgeon: Farrel Gobble. Harrington Challenger, MD;  Location: [facility];  Service: Gynecology;  Laterality: Bilateral;  . Colonoscopy w/ biopsies  08/2012    active ileitis, normal colon (bxs of both) Dr. Oletta Lamas   Family History  Problem Relation Age of Onset  . Hypertension Mother   . Heart Problems Mother     tumor, open heart surgery  . Miscarriages / [place] Mother   . Heart Problems Maternal Aunt     tumor  . Diabetes  Father   . Alcohol abuse Father   . Diabetes Paternal Grandmother   . Diabetes Maternal Grandmother   . Colon cancer Neg Hx   . Colon polyps Neg Hx   . Kidney disease Neg Hx   . Esophageal cancer Neg Hx    Social History  Substance Use Topics  . Smoking status: Never Smoker   . Smokeless tobacco: Never Used  . Alcohol Use: 0.0 oz/week    0 Standard drinks or equivalent per week     Comment: occasionally.   OB History    Gravida Para Term Preterm AB TAB SAB Ectopic Multiple Living   4 4 2 2      4      Review of Systems  Constitutional: Negative for fever and chills.  Respiratory: Negative for shortness of breath.   Gastrointestinal: Positive for nausea and vomiting. Negative for abdominal pain and diarrhea.  All other systems reviewed and are negative.     Allergies  Review of patient's allergies indicates no known allergies.  Home Medications   Prior to Admission medications   Medication Sig Start Date End Date Taking? Authorizing Provider  oxyCODONE-acetaminophen (PERCOCET/ROXICET) 5-325 MG per tablet Take 1 tablet by mouth every 6 (six) hours as needed for severe pain. Patient taking differently: Take 1 tablet by mouth every 6 (six) hours as needed for moderate pain or severe pain.  02/18/15  Yes  Junius Creamer, NP  clindamycin (CLEOCIN) 150 MG capsule Take 1 capsule (150 mg total) by mouth every 6 (six) hours. 02/18/15   Junius Creamer, NP  ondansetron (ZOFRAN ODT) 4 MG disintegrating tablet Take 1 tablet (4 mg total) by mouth every 8 (eight) hours as needed for nausea or vomiting. 06/02/15   Junius Creamer, NP  potassium chloride SA (K-DUR,KLOR-CON) 20 MEQ tablet Take 2 tablets (40 mEq total) by mouth daily. Patient not taking: Reported on 06/02/2015 02/18/15   Junius Creamer, NP  traMADol (ULTRAM) 50 MG tablet Take 1 tablet (50 mg total) by mouth every 6 (six) hours as needed. Patient not taking: Reported on 06/02/2015 02/12/15   Hyman Bible, PA-C  trimethoprim-polymyxin b  (POLYTRIM) ophthalmic solution Place 1 drop into the right eye every 6 (six) hours. Use for 5 days Patient not taking: Reported on 06/02/2015 02/12/15   Heather Laisure, PA-C   BP 113/66 mmHg  Pulse 75  Temp(Src) 97.7 F (36.5 C) (Oral)  Resp 20  SpO2 100% Physical Exam  Constitutional: She appears well-developed and well-nourished.  HENT:  Head: Normocephalic.  Eyes: Pupils are equal, round, and reactive to light.  Neck: Normal range of motion.  Cardiovascular: Normal rate and regular rhythm.   Pulmonary/Chest: Effort normal.  Abdominal: Soft. Bowel sounds are normal. She exhibits no distension.  Musculoskeletal: Normal range of motion.  Neurological: She is alert.  Skin: Skin is warm.  Vitals reviewed.   ED Course  Procedures (including critical care time) Labs Review Labs Reviewed  CBC WITH DIFFERENTIAL/PLATELET - Abnormal; Notable for the following:    RBC 3.84 (*)    Hemoglobin 11.2 (*)    HCT 35.2 (*)    All other components within normal limits  I-STAT CHEM 8, ED - Abnormal; Notable for the following:    Glucose, Bld 164 (*)    All other components within normal limits    Imaging Review No results found. I have personally reviewed and evaluated these images and lab results as part of my medical decision-making.   EKG Interpretation None     No further episodes of nausea, vomiting.  Patient was given 1 L of IV fluid for hydration.  Labs were reviewed, all within normal parameters.  Patient is tolerating by mouth fluids without incident MDM   Final diagnoses:  Non-intractable vomiting with nausea, vomiting of unspecified type         Junius Creamer, NP 06/02/15 [id number]  Veryl Speak, MD 06/02/15 [id number]

## 2015-06-03 ENCOUNTER — Encounter (HOSPITAL_COMMUNITY): Payer: Self-pay

## 2015-06-03 ENCOUNTER — Emergency Department (HOSPITAL_COMMUNITY)
Admission: EM | Admit: 2015-06-03 | Discharge: 2015-06-03 | Disposition: A | Discharge status: Home / Self Care | Payer: Medicaid Other | Attending: Physician Assistant | Admitting: Physician Assistant

## 2015-06-03 DIAGNOSIS — R197 Diarrhea, unspecified: Secondary | ICD-10-CM | POA: Diagnosis not present

## 2015-06-03 DIAGNOSIS — R1010 Upper abdominal pain, unspecified: Secondary | ICD-10-CM | POA: Diagnosis present

## 2015-06-03 DIAGNOSIS — Z9889 Other specified postprocedural states: Secondary | ICD-10-CM | POA: Insufficient documentation

## 2015-06-03 DIAGNOSIS — R110 Nausea: Secondary | ICD-10-CM

## 2015-06-03 DIAGNOSIS — R112 Nausea with vomiting, unspecified: Secondary | ICD-10-CM | POA: Insufficient documentation

## 2015-06-03 DIAGNOSIS — R1013 Epigastric pain: Secondary | ICD-10-CM | POA: Diagnosis not present

## 2015-06-03 DIAGNOSIS — Z86018 Personal history of other benign neoplasm: Secondary | ICD-10-CM | POA: Diagnosis not present

## 2015-06-03 DIAGNOSIS — Z862 Personal history of diseases of the blood and blood-forming organs and certain disorders involving the immune mechanism: Secondary | ICD-10-CM | POA: Diagnosis not present

## 2015-06-03 DIAGNOSIS — Z3202 Encounter for pregnancy test, result negative: Secondary | ICD-10-CM | POA: Diagnosis not present

## 2015-06-03 DIAGNOSIS — Z8632 Personal history of gestational diabetes: Secondary | ICD-10-CM | POA: Insufficient documentation

## 2015-06-03 DIAGNOSIS — R109 Unspecified abdominal pain: Secondary | ICD-10-CM

## 2015-06-03 DIAGNOSIS — Z8619 Personal history of other infectious and parasitic diseases: Secondary | ICD-10-CM | POA: Insufficient documentation

## 2015-06-03 DIAGNOSIS — Z8719 Personal history of other diseases of the digestive system: Secondary | ICD-10-CM | POA: Diagnosis not present

## 2015-06-03 LAB — COMPREHENSIVE METABOLIC PANEL
ALT: 11 U/L — ABNORMAL LOW (ref 14–54)
AST: 19 U/L (ref 15–41)
Albumin: 3.4 g/dL — ABNORMAL LOW (ref 3.5–5.0)
Alkaline Phosphatase: 75 U/L (ref 38–126)
Anion gap: 7 (ref 5–15)
BUN: 7 mg/dL (ref 6–20)
CHLORIDE: 110 mmol/L (ref 101–111)
CO2: 21 mmol/L — ABNORMAL LOW (ref 22–32)
Calcium: 8.9 mg/dL (ref 8.9–10.3)
Creatinine, Ser: 0.65 mg/dL (ref 0.44–1.00)
GFR calc Af Amer: >60 mL/min (ref >60)
Glucose, Bld: 110 mg/dL — ABNORMAL HIGH (ref 65–99)
POTASSIUM: 3.8 mmol/L (ref 3.5–5.1)
Sodium: 138 mmol/L (ref 135–145)
TOTAL PROTEIN: 6.8 g/dL (ref 6.5–8.1)
Total Bilirubin: 0.3 mg/dL (ref 0.3–1.2)

## 2015-06-03 LAB — CBC WITH DIFFERENTIAL/PLATELET
Basophils Absolute: 0 10*3/uL (ref 0.0–0.1)
Basophils Relative: 0 %
EOS PCT: 3 %
Eosinophils Absolute: 0.2 10*3/uL (ref 0.0–0.7)
HCT: 35.9 % — ABNORMAL LOW (ref 36.0–46.0)
Hemoglobin: 11.6 g/dL — ABNORMAL LOW (ref 12.0–15.0)
LYMPHS ABS: 1.6 10*3/uL (ref 0.7–4.0)
LYMPHS PCT: 27 %
MCH: 29.2 pg (ref 26.0–34.0)
MCHC: 32.3 g/dL (ref 30.0–36.0)
MCV: 90.4 fL (ref 78.0–100.0)
MONO ABS: 0.7 10*3/uL (ref 0.1–1.0)
MONOS PCT: 11 %
Neutro Abs: 3.5 10*3/uL (ref 1.7–7.7)
Neutrophils Relative %: 59 %
PLATELETS: 251 10*3/uL (ref 150–400)
RBC: 3.97 MIL/uL (ref 3.87–5.11)
RDW: 15.2 % (ref 11.5–15.5)
WBC: 5.9 10*3/uL (ref 4.0–10.5)

## 2015-06-03 LAB — LIPASE, BLOOD: LIPASE: 30 U/L (ref 11–51)

## 2015-06-03 LAB — PREGNANCY, URINE: Preg Test, Ur: NEGATIVE

## 2015-06-03 MED ORDER — ONDANSETRON HCL 4 MG/2ML IJ SOLN
4.0000 mg | Freq: Once | INTRAMUSCULAR | Status: AC
  Administered 2015-06-03: 4 mg via INTRAVENOUS
  Filled 2015-06-03: qty 2

## 2015-06-03 MED ORDER — SODIUM CHLORIDE 0.9 % IV BOLUS (SEPSIS)
500.0000 mL | Freq: Once | INTRAVENOUS | Status: AC
  Administered 2015-06-03: 500 mL via INTRAVENOUS

## 2015-06-03 MED ORDER — TRAMADOL HCL 50 MG PO TABS: 50.0000 mg | ORAL_TABLET | Freq: Four times a day (QID) | ORAL | Status: DC | PRN

## 2015-06-03 MED ORDER — ONDANSETRON HCL 4 MG PO TABS: 4.0000 mg | ORAL_TABLET | Freq: Four times a day (QID) | ORAL | Status: DC

## 2015-06-03 MED ORDER — MORPHINE SULFATE (PF) 4 MG/ML IV SOLN
4.0000 mg | Freq: Once | INTRAVENOUS | Status: AC
  Administered 2015-06-03: 4 mg via INTRAVENOUS
  Filled 2015-06-03: qty 1

## 2015-06-03 MED ORDER — GI COCKTAIL ~~LOC~~
30.0000 mL | Freq: Once | ORAL | Status: AC
  Administered 2015-06-03: 30 mL via ORAL
  Filled 2015-06-03: qty 30

## 2015-06-03 NOTE — ED Notes (Signed)
Bed: [id number] Expected date:  Expected time:  Means of arrival:  Comments: EMS 29 yo female abdominal pain, nausea, vomiting and diarrhea crohn's

## 2015-06-03 NOTE — ED Provider Notes (Signed)
CSN: [id number]     Arrival date & time 06/03/15  0600 History   First MD Initiated Contact with Patient 06/03/15 [phone]     Chief Complaint  Patient presents with  . Abdominal Pain     (Consider location/radiation/quality/duration/timing/severity/associated sxs/prior Treatment) HPI   Patient is a 29 year old female with past medical history of Crohn's who presents to the ED with complaint of abdominal pain, onset 2 days. Patient reports having upper abdominal pain with associated nausea and NBNB x1. Denies fever, chills, cough, SOB, chest pain, diarrhea, urinary symptoms, blood in urine or stool. Patient was seen in the ED yesterday for similar abdominal pain, nausea, vomiting, diarrhea, given IV fluids and Zofran symptom improvement. Patient was discharged home with Zofran. She reports taking 2 doses of Zofran at home with relief but notes when she woke up this morning she began to feel nauseous and did not take Zofran prior to coming to the ED this morning. She reports having history of Crohn's but states she has been off her Crohn's medications for months and has not seen her GI doctor due to transportation issues.  Past Medical History  Diagnosis Date  . Anemia   . Gestational diabetes mellitus in pregnancy   . Cardiac tumor   . Crohn's disease ([place])   . History of genital warts   . Gestational diabetes    Past Surgical History  Procedure Laterality Date  . Cardiac surgery      at 29 years old  . Tubal ligation Bilateral 02/15/2014    Procedure: POST PARTUM TUBAL LIGATION;  Surgeon: Farrel Gobble. Harrington Challenger, MD;  Location: [facility];  Service: Gynecology;  Laterality: Bilateral;  . Colonoscopy w/ biopsies  08/2012    active ileitis, normal colon (bxs of both) Dr. Oletta Lamas   Family History  Problem Relation Age of Onset  . Hypertension Mother   . Heart Problems Mother     tumor, open heart surgery  . Miscarriages / [place] Mother   . Heart Problems Maternal Aunt     tumor  . Diabetes  Father   . Alcohol abuse Father   . Diabetes Paternal Grandmother   . Diabetes Maternal Grandmother   . Colon cancer Neg Hx   . Colon polyps Neg Hx   . Kidney disease Neg Hx   . Esophageal cancer Neg Hx    Social History  Substance Use Topics  . Smoking status: Never Smoker   . Smokeless tobacco: Never Used  . Alcohol Use: 0.0 oz/week    0 Standard drinks or equivalent per week     Comment: occasionally.   OB History    Gravida Para Term Preterm AB TAB SAB Ectopic Multiple Living   4 4 2 2      4      Review of Systems  Gastrointestinal: Positive for nausea, vomiting and abdominal pain.  All other systems reviewed and are negative.     Allergies  Review of patient's allergies indicates no known allergies.  Home Medications   Prior to Admission medications   Medication Sig Start Date End Date Taking? Authorizing Provider  ondansetron (ZOFRAN ODT) 4 MG disintegrating tablet Take 1 tablet (4 mg total) by mouth every 8 (eight) hours as needed for nausea or vomiting. 06/02/15  Yes Junius Creamer, NP  oxyCODONE-acetaminophen (PERCOCET/ROXICET) 5-325 MG per tablet Take 1 tablet by mouth every 6 (six) hours as needed for severe pain. Patient taking differently: Take 1 tablet by mouth every 6 (six) hours as needed  for moderate pain or severe pain.  02/18/15  Yes Junius Creamer, NP  clindamycin (CLEOCIN) 150 MG capsule Take 1 capsule (150 mg total) by mouth every 6 (six) hours. Patient not taking: Reported on 06/03/2015 02/18/15   Junius Creamer, NP  ondansetron (ZOFRAN) 4 MG tablet Take 1 tablet (4 mg total) by mouth every 6 (six) hours. 06/03/15   Nona Dell, PA-C  potassium chloride SA (K-DUR,KLOR-CON) 20 MEQ tablet Take 2 tablets (40 mEq total) by mouth daily. Patient not taking: Reported on 06/02/2015 02/18/15   Junius Creamer, NP  traMADol (ULTRAM) 50 MG tablet Take 1 tablet (50 mg total) by mouth every 6 (six) hours as needed. 06/03/15   Nona Dell, PA-C   trimethoprim-polymyxin b (POLYTRIM) ophthalmic solution Place 1 drop into the right eye every 6 (six) hours. Use for 5 days Patient not taking: Reported on 06/02/2015 02/12/15   Hyman Bible, PA-C   BP 111/63 mmHg  Pulse 57  Temp(Src) 98.6 F (37 C) (Oral)  Resp 16  Ht 5\' 5"  (1.651 m)  Wt 68.04 kg  BMI 24.96 kg/m2  SpO2 100%  LMP 05/13/2015 Physical Exam  Constitutional: She is oriented to person, place, and time. She appears well-developed and well-nourished. No distress.  HENT:  Head: Normocephalic and atraumatic.  Mouth/Throat: Oropharynx is clear and moist. No oropharyngeal exudate.  Eyes: Conjunctivae and EOM are normal. Right eye exhibits no discharge. Left eye exhibits no discharge. No scleral icterus.  Neck: Normal range of motion. Neck supple.  Cardiovascular: Normal rate, regular rhythm, normal heart sounds and intact distal pulses.   Pulmonary/Chest: Effort normal and breath sounds normal. No respiratory distress. She has no wheezes. She has no rales. She exhibits no tenderness.  Abdominal: Soft. Bowel sounds are normal. She exhibits no distension and no mass. There is tenderness in the epigastric area. There is no rigidity, no rebound, no guarding and negative Murphy's sign.  Musculoskeletal: Normal range of motion. She exhibits no edema.  Lymphadenopathy:    She has no cervical adenopathy.  Neurological: She is alert and oriented to person, place, and time.  Skin: Skin is warm and dry. She is not diaphoretic.  Nursing note and vitals reviewed.   ED Course  Procedures (including critical care time) Labs Review Labs Reviewed  CBC WITH DIFFERENTIAL/PLATELET - Abnormal; Notable for the following:    Hemoglobin 11.6 (*)    HCT 35.9 (*)    All other components within normal limits  COMPREHENSIVE METABOLIC PANEL - Abnormal; Notable for the following:    CO2 21 (*)    Glucose, Bld 110 (*)    Albumin 3.4 (*)    ALT 11 (*)    All other components within normal limits   LIPASE, BLOOD  PREGNANCY, URINE    Imaging Review No results found. I have personally reviewed and evaluated these images and lab results as part of my medical decision-making.   Filed Vitals:   06/03/15 0749 06/03/15 0805  BP: 116/69 111/63  Pulse: 53 57  Temp:  98.6 F (37 C)  Resp: 18 16    MDM   Final diagnoses:  Abdominal pain, unspecified abdominal location  Nausea   Patient presents with abdominal pain and nausea. History of Crohn's. Patient was seen in the ED yesterday for similar abdominal pain and nausea, vomiting, diarrhea. She was given IV fluids and Zofran, labs unremarkable, discharged home with Zofran. Patient reports relief with Zofran at home but notes when she woke up this morning  she felt nauseous and came to the ED instead of taking her Zofran at home. She has not been followed by GI recently due to transportation issues. VSS. Exam revealed mild epigastric tenderness, no peritoneal signs. Labs unremarkable. Patient given IV fluids, Zofran and pain meds. Patient reports nausea and pain have resolved. Discussed with patient need for her to follow-up with GI regarding outpatient management of her chronic pain/sxs related to Crohn's. She is currently not on any medications for Crohn's. Discussed results and plan for discharge with patient.  Evaluation does not show pathology requring ongoing emergent intervention or admission. Pt is hemodynamically stable and mentating appropriately. Discussed findings/results and plan with patient/guardian, who agrees with plan. All questions answered. Return precautions discussed and outpatient follow up given.    Chesley Noon [place], [place] 06/03/15 0820  Courteney Julio Alm, MD 06/03/15 1620

## 2015-06-03 NOTE — ED Notes (Signed)
Pt states that the GI cocktail did not work and she still has pain and nausea

## 2015-06-03 NOTE — Discharge Instructions (Signed)
Take your medications as prescribed as needed for pain relief. Continue drinking fluids to remain hydrated. Follow-up with equal gastroenterology for outpatient management of your Crohn's disease. Return to emergency department if symptoms worsen or new onset of fever, abdominal pain, vomiting, blood in stool, unable to keep food/fluids down.

## 2015-06-03 NOTE — ED Notes (Signed)
Pt was seen yesterday for the same, given zofran and took two doses before bed last night, nothing this am, pt states she has vomited a few times and having diarrhea, pt thinks it's her crohn's dx

## 2015-09-09 ENCOUNTER — Ambulatory Visit: Payer: Medicaid Other | Admitting: Nurse Practitioner

## 2015-09-10 ENCOUNTER — Encounter: Payer: Self-pay | Admitting: Nurse Practitioner

## 2015-09-27 ENCOUNTER — Encounter (HOSPITAL_COMMUNITY): Payer: Self-pay

## 2015-09-27 ENCOUNTER — Emergency Department (HOSPITAL_COMMUNITY)
Admission: EM | Admit: 2015-09-27 | Discharge: 2015-09-27 | Disposition: A | Discharge status: Home / Self Care | Payer: Medicaid Other | Attending: Emergency Medicine | Admitting: Emergency Medicine

## 2015-09-27 ENCOUNTER — Emergency Department (HOSPITAL_COMMUNITY): Payer: Medicaid Other

## 2015-09-27 DIAGNOSIS — R079 Chest pain, unspecified: Secondary | ICD-10-CM | POA: Diagnosis present

## 2015-09-27 LAB — CBC
HCT: 34.2 % — ABNORMAL LOW (ref 36.0–46.0)
Hemoglobin: 11 g/dL — ABNORMAL LOW (ref 12.0–15.0)
MCH: 28.9 pg (ref 26.0–34.0)
MCHC: 32.2 g/dL (ref 30.0–36.0)
MCV: 89.8 fL (ref 78.0–100.0)
PLATELETS: 283 10*3/uL (ref 150–400)
RBC: 3.81 MIL/uL — AB (ref 3.87–5.11)
RDW: 14.5 % (ref 11.5–15.5)
WBC: 5.1 10*3/uL (ref 4.0–10.5)

## 2015-09-27 LAB — BASIC METABOLIC PANEL
Anion gap: 10 (ref 5–15)
CALCIUM: 9 mg/dL (ref 8.9–10.3)
CO2: 25 mmol/L (ref 22–32)
CREATININE: 0.72 mg/dL (ref 0.44–1.00)
Chloride: 105 mmol/L (ref 101–111)
GFR calc non Af Amer: >60 mL/min (ref >60)
GLUCOSE: 97 mg/dL (ref 65–99)
Potassium: 3.5 mmol/L (ref 3.5–5.1)
Sodium: 140 mmol/L (ref 135–145)

## 2015-09-27 LAB — I-STAT TROPONIN, ED: TROPONIN I, POC: 0 ng/mL (ref 0.00–0.08)

## 2015-09-27 NOTE — ED Notes (Signed)
No response to go back to a room x 3 attempts

## 2015-09-27 NOTE — ED Notes (Signed)
Pt reports onset 1 month intermittant chest pain.  Lasts about 5 min and occurs every hour.  No shortness of breath, nausea, vomiting, diarphoresis.  NAD at triage.

## 2015-09-27 NOTE — ED Notes (Signed)
No answer in waiting x 3.

## 2015-12-18 ENCOUNTER — Emergency Department (HOSPITAL_COMMUNITY)
Admission: EM | Admit: 2015-12-18 | Discharge: 2015-12-19 | Disposition: A | Discharge status: Home / Self Care | Payer: Medicaid Other | Attending: Emergency Medicine | Admitting: Emergency Medicine

## 2015-12-18 ENCOUNTER — Encounter (HOSPITAL_COMMUNITY): Payer: Self-pay | Admitting: Emergency Medicine

## 2015-12-18 DIAGNOSIS — R1031 Right lower quadrant pain: Secondary | ICD-10-CM | POA: Insufficient documentation

## 2015-12-18 DIAGNOSIS — R110 Nausea: Secondary | ICD-10-CM

## 2015-12-18 LAB — COMPREHENSIVE METABOLIC PANEL
ALT: 11 U/L — AB (ref 14–54)
ANION GAP: 6 (ref 5–15)
AST: 19 U/L (ref 15–41)
Albumin: 3.7 g/dL (ref 3.5–5.0)
Alkaline Phosphatase: 68 U/L (ref 38–126)
BUN: 11 mg/dL (ref 6–20)
CHLORIDE: 109 mmol/L (ref 101–111)
CO2: 23 mmol/L (ref 22–32)
CREATININE: 0.72 mg/dL (ref 0.44–1.00)
Calcium: 8.7 mg/dL — ABNORMAL LOW (ref 8.9–10.3)
Glucose, Bld: 102 mg/dL — ABNORMAL HIGH (ref 65–99)
Potassium: 3.8 mmol/L (ref 3.5–5.1)
Sodium: 138 mmol/L (ref 135–145)
Total Bilirubin: 0.4 mg/dL (ref 0.3–1.2)
Total Protein: 6.9 g/dL (ref 6.5–8.1)

## 2015-12-18 LAB — CBC WITH DIFFERENTIAL/PLATELET
Basophils Absolute: 0 10*3/uL (ref 0.0–0.1)
Basophils Relative: 0 %
EOS ABS: 0.2 10*3/uL (ref 0.0–0.7)
EOS PCT: 2 %
HCT: 34.1 % — ABNORMAL LOW (ref 36.0–46.0)
Hemoglobin: 11 g/dL — ABNORMAL LOW (ref 12.0–15.0)
LYMPHS ABS: 3.1 10*3/uL (ref 0.7–4.0)
Lymphocytes Relative: 47 %
MCH: 28.3 pg (ref 26.0–34.0)
MCHC: 32.3 g/dL (ref 30.0–36.0)
MCV: 87.7 fL (ref 78.0–100.0)
Monocytes Absolute: 0.6 10*3/uL (ref 0.1–1.0)
Monocytes Relative: 9 %
NEUTROS PCT: 42 %
Neutro Abs: 2.8 10*3/uL (ref 1.7–7.7)
PLATELETS: 332 10*3/uL (ref 150–400)
RBC: 3.89 MIL/uL (ref 3.87–5.11)
RDW: 16 % — AB (ref 11.5–15.5)
WBC: 6.7 10*3/uL (ref 4.0–10.5)

## 2015-12-18 MED ORDER — ONDANSETRON HCL 4 MG/2ML IJ SOLN
4.0000 mg | Freq: Once | INTRAMUSCULAR | Status: AC
  Administered 2015-12-18: 4 mg via INTRAVENOUS
  Filled 2015-12-18: qty 2

## 2015-12-18 MED ORDER — KETOROLAC TROMETHAMINE 30 MG/ML IJ SOLN
15.0000 mg | Freq: Once | INTRAMUSCULAR | Status: AC
  Administered 2015-12-18: 15 mg via INTRAVENOUS
  Filled 2015-12-18: qty 1

## 2015-12-18 MED ORDER — SODIUM CHLORIDE 0.9 % IV BOLUS (SEPSIS)
1000.0000 mL | Freq: Once | INTRAVENOUS | Status: AC
  Administered 2015-12-18: 1000 mL via INTRAVENOUS

## 2015-12-18 NOTE — ED Notes (Signed)
Patient presents via EMS from home for RLQ abdominal pain and nausea.   20g Left forearm, 4mg  zofran in route.   Last vs: 107/63, 62hr, 97%ra. 16resp

## 2015-12-18 NOTE — ED Notes (Signed)
Bed: [facility] Expected date:  Expected time:  Means of arrival:  Comments: 30 yr old abd pain

## 2015-12-18 NOTE — ED Provider Notes (Signed)
CSN: [id number]     Arrival date & time 12/18/15  2039 History   First MD Initiated Contact with Patient 12/18/15 2045     Chief Complaint  Patient presents with  . Abdominal Pain     (Consider location/radiation/quality/duration/timing/severity/associated sxs/prior Treatment) HPI Comments: The 30 year old female presenting by ambulance with right lower quadrant pain which started approximately an hour ago.  This is recurrent.  She states every months.  2 days after she and her menstrual cycle.  She has the same pain she was given Zofran by her primary care physician for this in the past but has run out.  Patient reports history of Crohn's disease for which she does not receive any treatment .  Does not report any bloody diarrhea, gestational diabetes , genital warts , cardiac tumor with excision at age 7, status post tubal ligation.  She denies diarrhea , constipation , dysuria , vaginal discharge , fever, cough.  Patient is a 30 y.o. female presenting with abdominal pain. The history is provided by the patient.  Abdominal Pain Pain location:  RLQ Pain quality: aching and cramping   Pain radiates to:  Does not radiate Pain severity:  Moderate Onset quality:  Gradual Duration:  1 hour Timing:  Constant Progression:  Unchanged Chronicity:  Recurrent Relieved by:  Nothing Worsened by:  Nothing tried Ineffective treatments:  None tried Associated symptoms: belching and nausea   Associated symptoms: no chills, no constipation, no diarrhea, no dysuria, no fever and no vomiting     Past Medical History  Diagnosis Date  . Anemia   . Gestational diabetes mellitus in pregnancy   . Cardiac tumor   . Crohn's disease ([place])   . History of genital warts   . Gestational diabetes    Past Surgical History  Procedure Laterality Date  . Cardiac surgery      at 30 years old  . Tubal ligation Bilateral 02/15/2014    Procedure: POST PARTUM TUBAL LIGATION;  Surgeon: Farrel Gobble. Harrington Challenger, MD;  Location:  [facility];  Service: Gynecology;  Laterality: Bilateral;  . Colonoscopy w/ biopsies  08/2012    active ileitis, normal colon (bxs of both) Dr. Oletta Lamas   Family History  Problem Relation Age of Onset  . Hypertension Mother   . Heart Problems Mother     tumor, open heart surgery  . Miscarriages / [place] Mother   . Heart Problems Maternal Aunt     tumor  . Diabetes Father   . Alcohol abuse Father   . Diabetes Paternal Grandmother   . Diabetes Maternal Grandmother   . Colon cancer Neg Hx   . Colon polyps Neg Hx   . Kidney disease Neg Hx   . Esophageal cancer Neg Hx    Social History  Substance Use Topics  . Smoking status: Never Smoker   . Smokeless tobacco: Never Used  . Alcohol Use: 0.0 oz/week    0 Standard drinks or equivalent per week     Comment: occasionally.   OB History    Gravida Para Term Preterm AB TAB SAB Ectopic Multiple Living   4 4 2 2      4      Review of Systems  Constitutional: Negative for fever and chills.  Gastrointestinal: Positive for nausea and abdominal pain. Negative for vomiting, diarrhea, constipation, blood in stool, abdominal distention and rectal pain.  Genitourinary: Negative for dysuria.  Musculoskeletal: Negative for myalgias.  Skin: Negative for wound.  All other systems reviewed and are  negative.     Allergies  Review of patient's allergies indicates no known allergies.  Home Medications   Prior to Admission medications   Medication Sig Start Date End Date Taking? Authorizing Provider  clindamycin (CLEOCIN) 150 MG capsule Take 1 capsule (150 mg total) by mouth every 6 (six) hours. Patient not taking: Reported on 06/03/2015 02/18/15   Junius Creamer, NP  ondansetron (ZOFRAN ODT) 4 MG disintegrating tablet Take 1 tablet (4 mg total) by mouth every 8 (eight) hours as needed for nausea or vomiting. 12/19/15   Junius Creamer, NP  ondansetron (ZOFRAN) 4 MG tablet Take 1 tablet (4 mg total) by mouth every 6 (six) hours. Patient not taking:  Reported on 12/18/2015 06/03/15   Nona Dell, PA-C  oxyCODONE-acetaminophen (PERCOCET/ROXICET) 5-325 MG per tablet Take 1 tablet by mouth every 6 (six) hours as needed for severe pain. Patient not taking: Reported on 12/18/2015 02/18/15   Junius Creamer, NP  potassium chloride SA (K-DUR,KLOR-CON) 20 MEQ tablet Take 2 tablets (40 mEq total) by mouth daily. Patient not taking: Reported on 06/02/2015 02/18/15   Junius Creamer, NP  traMADol (ULTRAM) 50 MG tablet Take 1 tablet (50 mg total) by mouth every 6 (six) hours as needed. 12/19/15   Junius Creamer, NP  trimethoprim-polymyxin b (POLYTRIM) ophthalmic solution Place 1 drop into the right eye every 6 (six) hours. Use for 5 days Patient not taking: Reported on 06/02/2015 02/12/15   Heather Laisure, PA-C   BP 110/65 mmHg  Pulse 56  Temp(Src) 98.7 F (37.1 C) (Oral)  Resp 16  SpO2 98% Physical Exam  Constitutional: She is oriented to person, place, and time. She appears well-developed and well-nourished.  HENT:  Head: Normocephalic.  Eyes: Pupils are equal, round, and reactive to light.  Neck: Normal range of motion.  Cardiovascular: Normal rate and regular rhythm.   Pulmonary/Chest: Effort normal and breath sounds normal.  Abdominal: Soft. Bowel sounds are normal. She exhibits no distension. There is tenderness in the right lower quadrant. There is no rebound and no guarding.  Musculoskeletal: Normal range of motion. She exhibits no edema or tenderness.  Neurological: She is alert and oriented to person, place, and time.  Skin: Skin is warm and dry.  Nursing note and vitals reviewed.   ED Course  Procedures (including critical care time) Labs Review Labs Reviewed  CBC WITH DIFFERENTIAL/PLATELET - Abnormal; Notable for the following:    Hemoglobin 11.0 (*)    HCT 34.1 (*)    RDW 16.0 (*)    All other components within normal limits  COMPREHENSIVE METABOLIC PANEL - Abnormal; Notable for the following:    Glucose, Bld 102 (*)    Calcium  8.7 (*)    ALT 11 (*)    All other components within normal limits    Imaging Review No results found. I have personally reviewed and evaluated these images and lab results as part of my medical decision-making.   EKG Interpretation None      MDM   Final diagnoses:  Right lower quadrant abdominal pain  Nausea         Junius Creamer, NP 12/19/15 [id number]  Sherwood Gambler, MD 12/22/15 [phone]

## 2015-12-19 MED ORDER — TRAMADOL HCL 50 MG PO TABS: 50.0000 mg | ORAL_TABLET | Freq: Four times a day (QID) | ORAL | Status: DC | PRN

## 2015-12-19 MED ORDER — ONDANSETRON 4 MG PO TBDP: 4.0000 mg | ORAL_TABLET | Freq: Three times a day (TID) | ORAL | Status: DC | PRN

## 2015-12-19 NOTE — ED Notes (Signed)
Pt. Tolerating fluids without difficulty

## 2015-12-19 NOTE — Discharge Instructions (Signed)

## 2015-12-25 ENCOUNTER — Emergency Department (HOSPITAL_COMMUNITY): Payer: Medicaid Other

## 2015-12-25 ENCOUNTER — Encounter (HOSPITAL_COMMUNITY): Payer: Self-pay | Admitting: Emergency Medicine

## 2015-12-25 ENCOUNTER — Emergency Department (HOSPITAL_COMMUNITY)
Admission: EM | Admit: 2015-12-25 | Discharge: 2015-12-25 | Disposition: A | Discharge status: Home / Self Care | Payer: Medicaid Other | Attending: Emergency Medicine | Admitting: Emergency Medicine

## 2015-12-25 DIAGNOSIS — N390 Urinary tract infection, site not specified: Secondary | ICD-10-CM | POA: Diagnosis not present

## 2015-12-25 DIAGNOSIS — R112 Nausea with vomiting, unspecified: Secondary | ICD-10-CM | POA: Insufficient documentation

## 2015-12-25 DIAGNOSIS — D259 Leiomyoma of uterus, unspecified: Secondary | ICD-10-CM | POA: Diagnosis not present

## 2015-12-25 DIAGNOSIS — N83201 Unspecified ovarian cyst, right side: Secondary | ICD-10-CM

## 2015-12-25 DIAGNOSIS — E860 Dehydration: Secondary | ICD-10-CM

## 2015-12-25 DIAGNOSIS — R935 Abnormal findings on diagnostic imaging of other abdominal regions, including retroperitoneum: Secondary | ICD-10-CM

## 2015-12-25 DIAGNOSIS — R109 Unspecified abdominal pain: Secondary | ICD-10-CM | POA: Diagnosis present

## 2015-12-25 LAB — URINALYSIS, ROUTINE W REFLEX MICROSCOPIC
Bilirubin Urine: NEGATIVE
GLUCOSE, UA: NEGATIVE mg/dL
HGB URINE DIPSTICK: NEGATIVE
Ketones, ur: 40 mg/dL — AB
LEUKOCYTES UA: NEGATIVE
Nitrite: NEGATIVE
Protein, ur: 30 mg/dL — AB
SPECIFIC GRAVITY, URINE: 1.031 — AB (ref 1.005–1.030)
pH: 7.5 (ref 5.0–8.0)

## 2015-12-25 LAB — COMPREHENSIVE METABOLIC PANEL
ALBUMIN: 4 g/dL (ref 3.5–5.0)
ALT: 12 U/L — AB (ref 14–54)
AST: 22 U/L (ref 15–41)
Alkaline Phosphatase: 69 U/L (ref 38–126)
Anion gap: 8 (ref 5–15)
BILIRUBIN TOTAL: 0.6 mg/dL (ref 0.3–1.2)
BUN: 6 mg/dL (ref 6–20)
CHLORIDE: 109 mmol/L (ref 101–111)
CO2: 22 mmol/L (ref 22–32)
CREATININE: 0.74 mg/dL (ref 0.44–1.00)
Calcium: 8.9 mg/dL (ref 8.9–10.3)
GFR calc Af Amer: >60 mL/min (ref >60)
GLUCOSE: 159 mg/dL — AB (ref 65–99)
POTASSIUM: 3.4 mmol/L — AB (ref 3.5–5.1)
Sodium: 139 mmol/L (ref 135–145)
TOTAL PROTEIN: 7.3 g/dL (ref 6.5–8.1)

## 2015-12-25 LAB — I-STAT BETA HCG BLOOD, ED (MC, WL, AP ONLY)

## 2015-12-25 LAB — CBC
HCT: 35.4 % — ABNORMAL LOW (ref 36.0–46.0)
HEMOGLOBIN: 11.4 g/dL — AB (ref 12.0–15.0)
MCH: 28.2 pg (ref 26.0–34.0)
MCHC: 32.2 g/dL (ref 30.0–36.0)
MCV: 87.6 fL (ref 78.0–100.0)
Platelets: 292 10*3/uL (ref 150–400)
RBC: 4.04 MIL/uL (ref 3.87–5.11)
RDW: 16.2 % — ABNORMAL HIGH (ref 11.5–15.5)
WBC: 6.8 10*3/uL (ref 4.0–10.5)

## 2015-12-25 LAB — PREGNANCY, URINE: PREG TEST UR: NEGATIVE

## 2015-12-25 LAB — URINE MICROSCOPIC-ADD ON

## 2015-12-25 LAB — LIPASE, BLOOD: Lipase: 21 U/L (ref 11–51)

## 2015-12-25 MED ORDER — IOPAMIDOL (ISOVUE-300) INJECTION 61%
100.0000 mL | Freq: Once | INTRAVENOUS | Status: AC | PRN
  Administered 2015-12-25: 100 mL via INTRAVENOUS

## 2015-12-25 MED ORDER — PROMETHAZINE HCL 25 MG PO TABS: 25.0000 mg | ORAL_TABLET | Freq: Four times a day (QID) | ORAL | Status: DC | PRN

## 2015-12-25 MED ORDER — MORPHINE SULFATE (PF) 4 MG/ML IV SOLN
4.0000 mg | Freq: Once | INTRAVENOUS | Status: AC
  Administered 2015-12-25: 4 mg via INTRAVENOUS
  Filled 2015-12-25: qty 1

## 2015-12-25 MED ORDER — CEFTRIAXONE SODIUM 1 G IJ SOLR
1.0000 g | Freq: Once | INTRAMUSCULAR | Status: AC
  Administered 2015-12-25: 1 g via INTRAVENOUS
  Filled 2015-12-25: qty 10

## 2015-12-25 MED ORDER — SODIUM CHLORIDE 0.9 % IV BOLUS (SEPSIS)
1000.0000 mL | Freq: Once | INTRAVENOUS | Status: AC
  Administered 2015-12-25: 1000 mL via INTRAVENOUS

## 2015-12-25 MED ORDER — SULFAMETHOXAZOLE-TRIMETHOPRIM 800-160 MG PO TABS: 1.0000 | ORAL_TABLET | Freq: Two times a day (BID) | ORAL | Status: AC

## 2015-12-25 MED ORDER — ONDANSETRON HCL 4 MG/2ML IJ SOLN
4.0000 mg | Freq: Once | INTRAMUSCULAR | Status: AC
  Administered 2015-12-25: 4 mg via INTRAVENOUS
  Filled 2015-12-25: qty 2

## 2015-12-25 MED ORDER — PROCHLORPERAZINE EDISYLATE 5 MG/ML IJ SOLN
10.0000 mg | Freq: Once | INTRAMUSCULAR | Status: AC
  Administered 2015-12-25: 10 mg via INTRAVENOUS
  Filled 2015-12-25: qty 2

## 2015-12-25 MED ORDER — PROMETHAZINE HCL 25 MG RE SUPP
25.0000 mg | Freq: Four times a day (QID) | RECTAL | Status: DC | PRN
Start: 2015-12-25 — End: 2015-12-27

## 2015-12-25 MED ORDER — PROMETHAZINE HCL 25 MG RE SUPP
25.0000 mg | Freq: Once | RECTAL | Status: AC
  Administered 2015-12-25: 25 mg via RECTAL
  Filled 2015-12-25: qty 1

## 2015-12-25 MED ORDER — METOCLOPRAMIDE HCL 5 MG/ML IJ SOLN
10.0000 mg | Freq: Once | INTRAMUSCULAR | Status: AC
  Administered 2015-12-25: 10 mg via INTRAVENOUS
  Filled 2015-12-25: qty 2

## 2015-12-25 MED ORDER — DIATRIZOATE MEGLUMINE & SODIUM 66-10 % PO SOLN: 15.0000 mL | ORAL | Status: DC | PRN

## 2015-12-25 NOTE — ED Notes (Signed)
MD at bedside. 

## 2015-12-25 NOTE — ED Notes (Signed)
Patient was alert, oriented and stable upon discharge. RN went over AVS and patient had no further questions.  

## 2015-12-25 NOTE — ED Notes (Signed)
Bed: WA01 Expected date:  Expected time:  Means of arrival:  Comments: 89F/RLQ pain/hx of crohn's

## 2015-12-25 NOTE — ED Notes (Signed)
Pt made aware of need for urine specimen 

## 2015-12-25 NOTE — ED Notes (Signed)
Pt BIB EMS after having N/V and abdominal pain. Denies diarrhea. States this is consistent with her Crohn's. Seen for the same last week but did not f/u with her PCP. 22g LFA. 4mg  given en route. Alert and oriented.

## 2015-12-25 NOTE — ED Notes (Addendum)
Pt told that she does not have to drink oral contrast.

## 2015-12-25 NOTE — ED Provider Notes (Signed)
CSN: [id number]     Arrival date & time 12/25/15  1655 History   First MD Initiated Contact with Patient 12/25/15 1702     Chief Complaint  Patient presents with  . Emesis  . Abdominal Pain  PT IS A 29 YO BF WITH A HX OF CROHN'S.  SHE PRESENTS TODAY VIA EMS WITH N/V AND ABD PAIN.  THE PT WAS HERE ON 6/8 WITH THE SAME SX.  SHE HAS BEEN UNABLE TO SEE HER PCP.  SHE WAS GIVEN ZOFRAN EN ROUTE BY EMS, BUT IT HAS NOT HELPED HER N/V.   (Consider location/radiation/quality/duration/timing/severity/associated sxs/prior Treatment) Patient is a 30 y.o. female presenting with vomiting and abdominal pain. The history is provided by the patient.  Emesis Severity:  Moderate Timing:  Intermittent Progression:  Worsening Chronicity:  Recurrent Recent urination:  Decreased Context: not post-tussive and not self-induced   Relieved by:  Nothing Ineffective treatments:  Antiemetics Associated symptoms: abdominal pain   Abdominal Pain Associated symptoms: nausea and vomiting     Past Medical History  Diagnosis Date  . Anemia   . Gestational diabetes mellitus in pregnancy   . Cardiac tumor   . Crohn's disease ([place])   . History of genital warts   . Gestational diabetes    Past Surgical History  Procedure Laterality Date  . Cardiac surgery      at 30 years old  . Tubal ligation Bilateral 02/15/2014    Procedure: POST PARTUM TUBAL LIGATION;  Surgeon: Farrel Gobble. Harrington Challenger, MD;  Location: [facility];  Service: Gynecology;  Laterality: Bilateral;  . Colonoscopy w/ biopsies  08/2012    active ileitis, normal colon (bxs of both) Dr. Oletta Lamas   Family History  Problem Relation Age of Onset  . Hypertension Mother   . Heart Problems Mother     tumor, open heart surgery  . Miscarriages / [place] Mother   . Heart Problems Maternal Aunt     tumor  . Diabetes Father   . Alcohol abuse Father   . Diabetes Paternal Grandmother   . Diabetes Maternal Grandmother   . Colon cancer Neg Hx   . Colon polyps Neg Hx    . Kidney disease Neg Hx   . Esophageal cancer Neg Hx    Social History  Substance Use Topics  . Smoking status: Never Smoker   . Smokeless tobacco: Never Used  . Alcohol Use: 0.0 oz/week    0 Standard drinks or equivalent per week     Comment: occasionally.   OB History    Gravida Para Term Preterm AB TAB SAB Ectopic Multiple Living   4 4 2 2      4      Review of Systems  Gastrointestinal: Positive for nausea, vomiting and abdominal pain.  All other systems reviewed and are negative.     Allergies  Review of patient's allergies indicates no known allergies.  Home Medications   Prior to Admission medications   Medication Sig Start Date End Date Taking? Authorizing Provider  ondansetron (ZOFRAN ODT) 4 MG disintegrating tablet Take 1 tablet (4 mg total) by mouth every 8 (eight) hours as needed for nausea or vomiting. 12/19/15  Yes Junius Creamer, NP  traMADol (ULTRAM) 50 MG tablet Take 1 tablet (50 mg total) by mouth every 6 (six) hours as needed. 12/19/15  Yes Junius Creamer, NP  promethazine (PHENERGAN) 25 MG suppository Place 1 suppository (25 mg total) rectally every 6 (six) hours as needed for nausea or vomiting. 12/25/15  Isla Pence, MD  promethazine (PHENERGAN) 25 MG tablet Take 1 tablet (25 mg total) by mouth every 6 (six) hours as needed for nausea or vomiting. 12/25/15   Isla Pence, MD  sulfamethoxazole-trimethoprim (BACTRIM DS,SEPTRA DS) 800-160 MG tablet Take 1 tablet by mouth 2 (two) times daily. 12/25/15 01/01/16  Isla Pence, MD   BP 125/78 mmHg  Pulse 56  Temp(Src) 98.1 F (36.7 C) (Oral)  Resp 16  SpO2 97%  LMP  Physical Exam  Constitutional: She is oriented to person, place, and time. She appears well-developed and well-nourished.  HENT:  Head: Normocephalic and atraumatic.  Right Ear: External ear normal.  Left Ear: External ear normal.  Nose: Nose normal.  Mouth/Throat: Mucous membranes are dry.  Eyes: Conjunctivae and EOM are normal. Pupils are  equal, round, and reactive to light.  Neck: Normal range of motion. Neck supple.  Cardiovascular: Normal rate, regular rhythm, normal heart sounds and intact distal pulses.   Pulmonary/Chest: Effort normal and breath sounds normal.  Abdominal: Soft. Bowel sounds are normal. There is generalized tenderness.  Musculoskeletal: Normal range of motion.  Neurological: She is alert and oriented to person, place, and time.  Skin: Skin is warm and dry.  Psychiatric: She has a normal mood and affect. Her behavior is normal. Judgment and thought content normal.  Nursing note and vitals reviewed.   ED Course  Procedures (including critical care time) Labs Review Labs Reviewed  COMPREHENSIVE METABOLIC PANEL - Abnormal; Notable for the following:    Potassium 3.4 (*)    Glucose, Bld 159 (*)    ALT 12 (*)    All other components within normal limits  CBC - Abnormal; Notable for the following:    Hemoglobin 11.4 (*)    HCT 35.4 (*)    RDW 16.2 (*)    All other components within normal limits  URINALYSIS, ROUTINE W REFLEX MICROSCOPIC (NOT AT [facility]) - Abnormal; Notable for the following:    APPearance CLOUDY (*)    Specific Gravity, Urine 1.031 (*)    Ketones, ur 40 (*)    Protein, ur 30 (*)    All other components within normal limits  URINE MICROSCOPIC-ADD ON - Abnormal; Notable for the following:    Squamous Epithelial / LPF 6-30 (*)    Bacteria, UA MANY (*)    All other components within normal limits  URINE CULTURE  LIPASE, BLOOD  PREGNANCY, URINE  I-STAT BETA HCG BLOOD, ED ([facility], [facility], [facility] ONLY)    Imaging Review US Transvaginal Non-ob  12/25/2015  CLINICAL DATA:  Assess right ovarian cyst noted on CT. Initial encounter. EXAM: TRANSABDOMINAL AND TRANSVAGINAL ULTRASOUND OF PELVIS TECHNIQUE: Both transabdominal and transvaginal ultrasound examinations of the pelvis were performed. Transabdominal technique was performed for global imaging of the pelvis including uterus, ovaries, adnexal  regions, and pelvic cul-de-sac. It was necessary to proceed with endovaginal exam following the transabdominal exam to visualize the uterus and ovaries in greater detail. COMPARISON:  CT of the abdomen and pelvis performed earlier today at 7:08 p.m. FINDINGS: Uterus Measurements: 9.3 x 4.0 x 5.8 cm. Two myometrial / subserosal fibroids are seen, noted at the right posterior aspect of the uterine wall, measuring 3.2 cm and 1.5 cm in size. Endometrium Thickness: 1.5 cm.  No focal abnormality visualized. Right ovary Measurements: 3.6 x 2.3 x 2.8 cm. Normal appearance/no adnexal mass. Left ovary Measurements: 2.9 x 1.8 x 2.6 cm. Normal appearance/no adnexal mass. Other findings Trace free fluid is seen within the pelvic  cul-de-sac Prominent vasculature is noted at the right adnexa, raising question for pelvic congestion syndrome in the appropriate clinical situation. IMPRESSION: 1. The apparent right adnexal cyst noted on CT, reflects the larger of the patient's uterine fibroids, with likely mild degeneration. Smaller adjacent fibroid also seen. 2. No evidence for ovarian torsion. Ovaries unremarkable in appearance. 3. Prominent vasculature at the right adnexa raises question for pelvic congestion syndrome in the appropriate clinical situation. Electronically Signed   By: Garald Balding M.D.   On: 12/25/2015 21:30   US Pelvis Complete  12/25/2015  CLINICAL DATA:  Assess right ovarian cyst noted on CT. Initial encounter. EXAM: TRANSABDOMINAL AND TRANSVAGINAL ULTRASOUND OF PELVIS TECHNIQUE: Both transabdominal and transvaginal ultrasound examinations of the pelvis were performed. Transabdominal technique was performed for global imaging of the pelvis including uterus, ovaries, adnexal regions, and pelvic cul-de-sac. It was necessary to proceed with endovaginal exam following the transabdominal exam to visualize the uterus and ovaries in greater detail. COMPARISON:  CT of the abdomen and pelvis performed earlier today  at 7:08 p.m. FINDINGS: Uterus Measurements: 9.3 x 4.0 x 5.8 cm. Two myometrial / subserosal fibroids are seen, noted at the right posterior aspect of the uterine wall, measuring 3.2 cm and 1.5 cm in size. Endometrium Thickness: 1.5 cm.  No focal abnormality visualized. Right ovary Measurements: 3.6 x 2.3 x 2.8 cm. Normal appearance/no adnexal mass. Left ovary Measurements: 2.9 x 1.8 x 2.6 cm. Normal appearance/no adnexal mass. Other findings Trace free fluid is seen within the pelvic cul-de-sac Prominent vasculature is noted at the right adnexa, raising question for pelvic congestion syndrome in the appropriate clinical situation. IMPRESSION: 1. The apparent right adnexal cyst noted on CT, reflects the larger of the patient's uterine fibroids, with likely mild degeneration. Smaller adjacent fibroid also seen. 2. No evidence for ovarian torsion. Ovaries unremarkable in appearance. 3. Prominent vasculature at the right adnexa raises question for pelvic congestion syndrome in the appropriate clinical situation. Electronically Signed   By: Garald Balding M.D.   On: 12/25/2015 21:30   Ct Abdomen Pelvis W Contrast  12/25/2015  CLINICAL DATA:  Nausea vomiting and abdominal pain. History of Crohn's disease. EXAM: CT ABDOMEN AND PELVIS WITH CONTRAST TECHNIQUE: Multidetector CT imaging of the abdomen and pelvis was performed using the standard protocol following bolus administration of intravenous contrast. CONTRAST:  188mL ISOVUE-300 IOPAMIDOL (ISOVUE-300) INJECTION 61% COMPARISON:  CT of the abdomen and pelvis 05/07/2014 FINDINGS: Lower chest:  No acute findings. Hepatobiliary: Normal contour of the liver. 6 mm too small to be actually characterize by CT lesion within the right lower lobe. Pancreas: No mass, inflammatory changes, or other significant abnormality. Spleen: Within normal limits in size and appearance. Adrenals/Urinary Tract: No masses identified. No evidence of hydronephrosis. Stomach/Bowel: No evidence of  obstruction, inflammatory process, or abnormal fluid collections. Vascular/Lymphatic: No pathologically enlarged lymph nodes. No evidence of abdominal aortic aneurysm. Reproductive: Heterogeneous appearance of the uterus. The endometrial canal is distended measuring up to 2.8 cm. There is a complex cystic mass within the right pelvis measuring 3.7 cm. The left ovary is normal. The right ovary is not identified with certainty. Bilateral fallopian tube closure devices are in place. Other: Small amount of water density free fluid in the pelvis. Musculoskeletal:  No suspicious bone lesions identified. IMPRESSION: No CT evidence of abnormalities within the solid abdominal organs. Heterogeneous appearance of the uterus with distension of the endometrial canal. 3.7 cm complex right pelvic mass, which may be located within the right adnexa.  The right ovary is not identified with certainty. Small amount of free pelvic fluid. Correlation with pelvic ultrasound is recommended for better visualization of the reproductive organs. Electronically Signed   By: Fidela Salisbury M.D.   On: 12/25/2015 19:21   I have personally reviewed and evaluated these images and lab results as part of my medical decision-making.   EKG Interpretation None      MDM  PT IS STILL NAUSEOUS, BUT HAS IMPROVED.  NO SIGN OF PYELO ON CT.  THE PT WILL BE D/C'D HOME ON ORAL AND SUPPOSITORY PHENERGAN AS WELL AS BACTRIM.  PT KNOWS TO RETURN IF WORSE. Final diagnoses:  UTI (lower urinary tract infection)  Non-intractable vomiting with nausea, vomiting of unspecified type  Dehydration  Uterine leiomyoma, unspecified location      Isla Pence, MD 12/29/15 [phone]

## 2015-12-27 ENCOUNTER — Emergency Department (HOSPITAL_COMMUNITY)
Admission: EM | Admit: 2015-12-27 | Discharge: 2015-12-27 | Disposition: A | Discharge status: Home / Self Care | Payer: Medicaid Other | Attending: Emergency Medicine | Admitting: Emergency Medicine

## 2015-12-27 ENCOUNTER — Encounter (HOSPITAL_COMMUNITY): Payer: Self-pay | Admitting: *Deleted

## 2015-12-27 DIAGNOSIS — R1031 Right lower quadrant pain: Secondary | ICD-10-CM | POA: Insufficient documentation

## 2015-12-27 DIAGNOSIS — R112 Nausea with vomiting, unspecified: Secondary | ICD-10-CM | POA: Diagnosis not present

## 2015-12-27 DIAGNOSIS — R109 Unspecified abdominal pain: Secondary | ICD-10-CM

## 2015-12-27 LAB — URINE CULTURE: Special Requests: NORMAL

## 2015-12-27 LAB — URINALYSIS, ROUTINE W REFLEX MICROSCOPIC
Bilirubin Urine: NEGATIVE
GLUCOSE, UA: NEGATIVE mg/dL
HGB URINE DIPSTICK: NEGATIVE
Ketones, ur: >80 mg/dL — AB
Leukocytes, UA: NEGATIVE
Nitrite: NEGATIVE
PH: 6 (ref 5.0–8.0)
PROTEIN: 30 mg/dL — AB
Specific Gravity, Urine: 1.031 — ABNORMAL HIGH (ref 1.005–1.030)

## 2015-12-27 LAB — URINE MICROSCOPIC-ADD ON

## 2015-12-27 LAB — CBC WITH DIFFERENTIAL/PLATELET
Basophils Absolute: 0 10*3/uL (ref 0.0–0.1)
Basophils Relative: 0 %
EOS PCT: 0 %
Eosinophils Absolute: 0 10*3/uL (ref 0.0–0.7)
HCT: 37 % (ref 36.0–46.0)
Hemoglobin: 12.3 g/dL (ref 12.0–15.0)
LYMPHS ABS: 1.6 10*3/uL (ref 0.7–4.0)
LYMPHS PCT: 25 %
MCH: 28.1 pg (ref 26.0–34.0)
MCHC: 33.2 g/dL (ref 30.0–36.0)
MCV: 84.5 fL (ref 78.0–100.0)
MONO ABS: 0.5 10*3/uL (ref 0.1–1.0)
Monocytes Relative: 9 %
Neutro Abs: 4.1 10*3/uL (ref 1.7–7.7)
Neutrophils Relative %: 66 %
PLATELETS: 324 10*3/uL (ref 150–400)
RBC: 4.38 MIL/uL (ref 3.87–5.11)
RDW: 16.1 % — AB (ref 11.5–15.5)
WBC: 6.2 10*3/uL (ref 4.0–10.5)

## 2015-12-27 LAB — WET PREP, GENITAL
Clue Cells Wet Prep HPF POC: NONE SEEN
Sperm: NONE SEEN
Trich, Wet Prep: NONE SEEN
WBC WET PREP: NONE SEEN
YEAST WET PREP: NONE SEEN

## 2015-12-27 LAB — COMPREHENSIVE METABOLIC PANEL
ALT: 14 U/L (ref 14–54)
AST: 22 U/L (ref 15–41)
Albumin: 4.4 g/dL (ref 3.5–5.0)
Alkaline Phosphatase: 75 U/L (ref 38–126)
Anion gap: 10 (ref 5–15)
BILIRUBIN TOTAL: 0.9 mg/dL (ref 0.3–1.2)
BUN: 7 mg/dL (ref 6–20)
CALCIUM: 9.4 mg/dL (ref 8.9–10.3)
CHLORIDE: 105 mmol/L (ref 101–111)
CO2: 22 mmol/L (ref 22–32)
CREATININE: 0.91 mg/dL (ref 0.44–1.00)
Glucose, Bld: 119 mg/dL — ABNORMAL HIGH (ref 65–99)
Potassium: 3 mmol/L — ABNORMAL LOW (ref 3.5–5.1)
Sodium: 137 mmol/L (ref 135–145)
TOTAL PROTEIN: 8.1 g/dL (ref 6.5–8.1)

## 2015-12-27 LAB — LIPASE, BLOOD: LIPASE: 26 U/L (ref 11–51)

## 2015-12-27 MED ORDER — SODIUM CHLORIDE 0.9 % IV BOLUS (SEPSIS)
1000.0000 mL | Freq: Once | INTRAVENOUS | Status: AC
Start: 2015-12-27 — End: 2015-12-27
  Administered 2015-12-27: 1000 mL via INTRAVENOUS

## 2015-12-27 MED ORDER — PROMETHAZINE HCL 25 MG PO TABS: 25.0000 mg | ORAL_TABLET | Freq: Four times a day (QID) | ORAL | Status: DC | PRN

## 2015-12-27 MED ORDER — PROMETHAZINE HCL 25 MG RE SUPP: 25.0000 mg | Freq: Four times a day (QID) | RECTAL | Status: DC | PRN

## 2015-12-27 MED ORDER — POTASSIUM CHLORIDE CRYS ER 20 MEQ PO TBCR
40.0000 meq | EXTENDED_RELEASE_TABLET | Freq: Once | ORAL | Status: AC
  Administered 2015-12-27: 40 meq via ORAL
  Filled 2015-12-27: qty 2

## 2015-12-27 MED ORDER — ONDANSETRON HCL 4 MG/2ML IJ SOLN
4.0000 mg | Freq: Once | INTRAMUSCULAR | Status: AC
  Administered 2015-12-27: 4 mg via INTRAVENOUS
  Filled 2015-12-27: qty 2

## 2015-12-27 MED ORDER — PROMETHAZINE HCL 25 MG/ML IJ SOLN
25.0000 mg | Freq: Once | INTRAMUSCULAR | Status: AC
  Administered 2015-12-27: 25 mg via INTRAVENOUS
  Filled 2015-12-27: qty 1

## 2015-12-27 MED ORDER — METOCLOPRAMIDE HCL 10 MG PO TABS
10.0000 mg | ORAL_TABLET | Freq: Once | ORAL | Status: AC
  Administered 2015-12-27: 10 mg via ORAL
  Filled 2015-12-27: qty 1

## 2015-12-27 NOTE — ED Provider Notes (Signed)
CSN: [id number]     Arrival date & time 12/27/15  [id number] History   First MD Initiated Contact with Patient 12/27/15 0945     Chief Complaint  Patient presents with  . Emesis  . Abdominal Pain   HPI  Melanie [name] is a 30 y.o. female PMH significant for Chrohn's presenting with a several day history of RLQ pain and nausea/vomiting. She describes her pain as chronic, 6/10 pain scale, nonradiating, constant, no alleviating/exacerbating factors. Patient was seen here on 6/15 for similar, had a CT scan of abdomen/pelvis, US pelvis with findings of a right adnexal mass. She was told to follow-up with PCP, which she has not done. She denies fevers, chills, diarrhea, dysuria, vaginal discharge/odor/itching.   Past Medical History  Diagnosis Date  . Anemia   . Cardiac tumor   . Crohn's disease ([place])   . History of genital warts   . Gestational diabetes mellitus in pregnancy   . Gestational diabetes    Past Surgical History  Procedure Laterality Date  . Cardiac surgery      at 30 years old  . Tubal ligation Bilateral 02/15/2014    Procedure: POST PARTUM TUBAL LIGATION;  Surgeon: Farrel Gobble. Harrington Challenger, MD;  Location: [facility];  Service: Gynecology;  Laterality: Bilateral;  . Colonoscopy w/ biopsies  08/2012    active ileitis, normal colon (bxs of both) Dr. Oletta Lamas   Family History  Problem Relation Age of Onset  . Hypertension Mother   . Heart Problems Mother     tumor, open heart surgery  . Miscarriages / [place] Mother   . Heart Problems Maternal Aunt     tumor  . Diabetes Father   . Alcohol abuse Father   . Diabetes Paternal Grandmother   . Diabetes Maternal Grandmother   . Colon cancer Neg Hx   . Colon polyps Neg Hx   . Kidney disease Neg Hx   . Esophageal cancer Neg Hx    Social History  Substance Use Topics  . Smoking status: Never Smoker   . Smokeless tobacco: Never Used  . Alcohol Use: 0.0 oz/week    0 Standard drinks or equivalent per week     Comment: occasionally.    OB History    Gravida Para Term Preterm AB TAB SAB Ectopic Multiple Living   4 4 2 2      4      Review of Systems  Ten systems are reviewed and are negative for acute change except as noted in the HPI  Allergies  Review of patient's allergies indicates no known allergies.  Home Medications   Prior to Admission medications   Medication Sig Start Date End Date Taking? Authorizing Provider  ondansetron (ZOFRAN ODT) 4 MG disintegrating tablet Take 1 tablet (4 mg total) by mouth every 8 (eight) hours as needed for nausea or vomiting. 12/19/15   Junius Creamer, NP  promethazine (PHENERGAN) 25 MG suppository Place 1 suppository (25 mg total) rectally every 6 (six) hours as needed for nausea or vomiting. 12/25/15   Isla Pence, MD  promethazine (PHENERGAN) 25 MG tablet Take 1 tablet (25 mg total) by mouth every 6 (six) hours as needed for nausea or vomiting. 12/25/15   Isla Pence, MD  sulfamethoxazole-trimethoprim (BACTRIM DS,SEPTRA DS) 800-160 MG tablet Take 1 tablet by mouth 2 (two) times daily. 12/25/15 01/01/16  Isla Pence, MD  traMADol (ULTRAM) 50 MG tablet Take 1 tablet (50 mg total) by mouth every 6 (six) hours as needed. 12/19/15  Junius Creamer, NP   BP 145/82 mmHg  Pulse 64  Temp(Src) 98.8 F (37.1 C) (Oral)  Resp 17  Wt 62.596 kg  SpO2 100% Physical Exam  Constitutional: She appears well-developed and well-nourished. No distress.  HENT:  Head: Normocephalic and atraumatic.  Mouth/Throat: Oropharynx is clear and moist. No oropharyngeal exudate.  Eyes: Conjunctivae are normal. Pupils are equal, round, and reactive to light. Right eye exhibits no discharge. Left eye exhibits no discharge. No scleral icterus.  Neck: No tracheal deviation present.  Cardiovascular: Normal rate, regular rhythm, normal heart sounds and intact distal pulses.  Exam reveals no gallop and no friction rub.   No murmur heard. Pulmonary/Chest: Effort normal and breath sounds normal. No respiratory  distress. She has no wheezes. She has no rales. She exhibits no tenderness.  Abdominal: Soft. Bowel sounds are normal. She exhibits no distension and no mass. There is tenderness. There is no rebound and no guarding.  RLQ tenderness  Genitourinary:  Chaperoned pelvic exam: normal external genitalia, vulva, vagina, cervix, uterus. Right adnexal tenderness.   Musculoskeletal: She exhibits no edema.  Lymphadenopathy:    She has no cervical adenopathy.  Neurological: She is alert. Coordination normal.  Skin: Skin is warm and dry. No rash noted. She is not diaphoretic. No erythema.  Psychiatric: She has a normal mood and affect. Her behavior is normal.  Nursing note and vitals reviewed.   ED Course  Procedures  Labs Review Labs Reviewed - No data to display   MDM   Final diagnoses:  Nausea and vomiting, vomiting of unspecified type  Abdominal pain, unspecified abdominal location   Patient nontoxic appearing, VSS. Plan is to control N/V, as patient has had extensive workup, and encourage follow-up. Will perform pelvic exam, as patient has not had this.  Wet prep, CBC, lipase unremarkable. Mild hypokalemia of 3.0- will provide potassium.  UA demonstrates ketones, proteinuria, unremarkable otherwise.  Patient feeling improved after fluids and antiemetics. Patient may be safely discharged home. Discussed reasons for return. Patient to follow-up with primary care provider within one week. Patient in understanding and agreement with the plan.  Brundidge Lions, PA-C 01/06/16 [place], MD 01/08/16 [phone]

## 2015-12-27 NOTE — Discharge Instructions (Signed)
Melanie [name],  Nice meeting you! Please follow-up with your primary care provider, gastroenterology and gynecologist. Return to the emergency department if you develop increased abdominal pain, inability to keep foods down, new/worsening symptoms. Feel better soon!  S. Wendie Simmer, PA-C   Abdominal Pain, Adult Many things can cause abdominal pain. Usually, abdominal pain is not caused by a disease and will improve without treatment. It can often be observed and treated at home. Your health care provider will do a physical exam and possibly order blood tests and X-rays to help determine the seriousness of your pain. However, in many cases, more time must pass before a clear cause of the pain can be found. Before that point, your health care provider may not know if you need more testing or further treatment. HOME CARE INSTRUCTIONS Monitor your abdominal pain for any changes. The following actions may help to alleviate any discomfort you are experiencing:  Only take over-the-counter or prescription medicines as directed by your health care provider.  Do not take laxatives unless directed to do so by your health care provider.  Try a clear liquid diet (broth, tea, or water) as directed by your health care provider. Slowly move to a bland diet as tolerated. SEEK MEDICAL CARE IF:  You have unexplained abdominal pain.  You have abdominal pain associated with nausea or diarrhea.  You have pain when you urinate or have a bowel movement.  You experience abdominal pain that wakes you in the night.  You have abdominal pain that is worsened or improved by eating food.  You have abdominal pain that is worsened with eating fatty foods.  You have a fever. SEEK IMMEDIATE MEDICAL CARE IF:  Your pain does not go away within 2 hours.  You keep throwing up (vomiting).  Your pain is felt only in portions of the abdomen, such as the right side or the left lower portion of the  abdomen.  You pass bloody or black tarry stools. MAKE SURE YOU:  Understand these instructions.  Will watch your condition.  Will get help right away if you are not doing well or get worse.   This information is not intended to replace advice given to you by your health care provider. Make sure you discuss any questions you have with your health care provider.   Document Released: 04/07/2005 Document Revised: 03/19/2015 Document Reviewed: 03/07/2013 Elsevier Interactive Patient Education [employer].

## 2015-12-27 NOTE — ED Notes (Signed)
Per EMS - patient comes from home with c/o RLQ pain and N/V for several days.  Patient was evaluated here for the same symptoms on 6/15 and discharged with a dx of UTI and right ovarian cyst.  She received prescriptions for Zofran and Bactrim on 6/15.  Uncertain as to whether or not she filled them.  Patient's vitals, 158/90, HR 58-60, 98% on RA.

## 2015-12-27 NOTE — ED Notes (Signed)
Made 1st request for urine sample,pt unable to provide one at this time.

## 2015-12-27 NOTE — ED Notes (Signed)
Bed: [id number] Expected date:  Expected time:  Means of arrival:  Comments: Ems n/v re-eval

## 2015-12-29 LAB — GC/CHLAMYDIA PROBE AMP (~~LOC~~) NOT AT ARMC
Chlamydia: NEGATIVE
Neisseria Gonorrhea: NEGATIVE

## 2015-12-31 ENCOUNTER — Other Ambulatory Visit: Payer: Self-pay | Admitting: Obstetrics and Gynecology

## 2016-09-23 ENCOUNTER — Encounter: Payer: Self-pay | Admitting: Physician Assistant

## 2016-10-05 ENCOUNTER — Ambulatory Visit: Payer: Medicaid Other | Admitting: Physician Assistant

## 2016-11-02 ENCOUNTER — Ambulatory Visit: Payer: Medicaid Other | Admitting: Physician Assistant

## 2017-02-11 NOTE — Telephone Encounter (Signed)
See note

## 2018-07-26 ENCOUNTER — Emergency Department (HOSPITAL_COMMUNITY): Payer: Medicaid Other

## 2018-07-26 ENCOUNTER — Encounter (HOSPITAL_COMMUNITY): Payer: Self-pay | Admitting: Emergency Medicine

## 2018-07-26 ENCOUNTER — Emergency Department (HOSPITAL_COMMUNITY)
Admission: EM | Admit: 2018-07-26 | Discharge: 2018-07-26 | Disposition: A | Discharge status: Home / Self Care | Payer: Medicaid Other | Attending: Emergency Medicine | Admitting: Emergency Medicine

## 2018-07-26 ENCOUNTER — Other Ambulatory Visit: Payer: Self-pay

## 2018-07-26 DIAGNOSIS — R1031 Right lower quadrant pain: Secondary | ICD-10-CM | POA: Diagnosis present

## 2018-07-26 DIAGNOSIS — R102 Pelvic and perineal pain: Secondary | ICD-10-CM | POA: Insufficient documentation

## 2018-07-26 LAB — URINALYSIS, ROUTINE W REFLEX MICROSCOPIC
BILIRUBIN URINE: NEGATIVE
Glucose, UA: NEGATIVE mg/dL
Hgb urine dipstick: NEGATIVE
KETONES UR: NEGATIVE mg/dL
Leukocytes, UA: NEGATIVE
NITRITE: NEGATIVE
PH: 5 (ref 5.0–8.0)
PROTEIN: NEGATIVE mg/dL
Specific Gravity, Urine: 1.031 — ABNORMAL HIGH (ref 1.005–1.030)

## 2018-07-26 LAB — COMPREHENSIVE METABOLIC PANEL
ALK PHOS: 59 U/L (ref 38–126)
ALT: 11 U/L (ref 0–44)
AST: 19 U/L (ref 15–41)
Albumin: 3.4 g/dL — ABNORMAL LOW (ref 3.5–5.0)
Anion gap: 7 (ref 5–15)
BILIRUBIN TOTAL: 0.3 mg/dL (ref 0.3–1.2)
BUN: 8 mg/dL (ref 6–20)
CALCIUM: 9 mg/dL (ref 8.9–10.3)
CO2: 24 mmol/L (ref 22–32)
CREATININE: 0.77 mg/dL (ref 0.44–1.00)
Chloride: 106 mmol/L (ref 98–111)
Glucose, Bld: 94 mg/dL (ref 70–99)
Potassium: 3.8 mmol/L (ref 3.5–5.1)
Sodium: 137 mmol/L (ref 135–145)
TOTAL PROTEIN: 6.6 g/dL (ref 6.5–8.1)

## 2018-07-26 LAB — CBC
HEMATOCRIT: 30.8 % — AB (ref 36.0–46.0)
Hemoglobin: 9.3 g/dL — ABNORMAL LOW (ref 12.0–15.0)
MCH: 24.9 pg — ABNORMAL LOW (ref 26.0–34.0)
MCHC: 30.2 g/dL (ref 30.0–36.0)
MCV: 82.4 fL (ref 80.0–100.0)
NRBC: 0 % (ref 0.0–0.2)
PLATELETS: 332 10*3/uL (ref 150–400)
RBC: 3.74 MIL/uL — ABNORMAL LOW (ref 3.87–5.11)
RDW: 17.3 % — AB (ref 11.5–15.5)
WBC: 5.8 10*3/uL (ref 4.0–10.5)

## 2018-07-26 LAB — I-STAT BETA HCG BLOOD, ED (MC, WL, AP ONLY): I-stat hCG, quantitative: <5 m[IU]/mL (ref <5)

## 2018-07-26 LAB — LIPASE, BLOOD: LIPASE: 38 U/L (ref 11–51)

## 2018-07-26 MED ORDER — KETOROLAC TROMETHAMINE 15 MG/ML IJ SOLN
30.0000 mg | Freq: Once | INTRAMUSCULAR | Status: AC
  Administered 2018-07-26: 30 mg via INTRAMUSCULAR
  Filled 2018-07-26: qty 2

## 2018-07-26 MED ORDER — SODIUM CHLORIDE 0.9% FLUSH: 3.0000 mL | Freq: Once | INTRAVENOUS | Status: DC

## 2018-07-26 NOTE — ED Notes (Signed)
Patient transported to Ultrasound 

## 2018-07-26 NOTE — ED Triage Notes (Signed)
C/o RLQ pain since yesterday.  Denies nausea, vomiting, or urinary complaints.

## 2018-07-26 NOTE — ED Provider Notes (Signed)
[facility] EMERGENCY DEPARTMENT Provider Note   CSN: [id number] Arrival date & time: 07/26/18  1648     History   Chief Complaint Chief Complaint  Patient presents with  . Abdominal Pain    HPI Melanie [name] is a 33 y.o. female with a past medical history significant for Anemia, crohn's disease who present today complaining of right lower quadrant abdominal pain. Patient reports that symptoms have been going on for the past 3 years since her daughter was born. She is here today because pain has lasted longer than usual.  Patient reports pain usually comes on at the end of her menstrual cycle and last for a day, pain is sharp in nature and she rates 10/10. She usually takes aleve for pain relief with minimal improvement  In her symptoms. Pain usually subsides a 2 days after her menstrual cycle. Patient denies any vaginal discharge but does endorses spotting. She is sexually active with the same partner. Denies any fever, chills, dysuria, nausea or vomiting. Patient has not been seen by her PCP or GYN for the past 3 years.   HPI  Past Medical History:  Diagnosis Date  . Anemia   . Cardiac tumor   . Crohn's disease ([place])   . Gestational diabetes   . Gestational diabetes mellitus in pregnancy   . History of genital warts     Patient Active Problem List   Diagnosis Date Noted  . Crohn's disease ([employer]) 05/07/2014  . Colitis 05/07/2014  . Pregnant 02/14/2014  . Supervision of other normal pregnancy 02/14/2014  . Gestational diabetes mellitus, class A2 01/30/2014  . 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 Antepartum bleeding, second trimester 10/15/2013  . Acute pericarditis, unspecified 09/12/2013  . 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 Nausea vomiting and diarrhea 06/15/2012  . Dehydration 06/15/2012  . Acute colitis 06/15/2012  . Hypokalemia 06/15/2012  . Preterm premature rupture of membranes 04/16/2011  .  Gestational diabetes 04/16/2011  . SVD (spontaneous vaginal delivery) 04/16/2011    Past Surgical History:  Procedure Laterality Date  . CARDIAC SURGERY     at 33 years old  . COLONOSCOPY W/ BIOPSIES  08/2012   active ileitis, normal colon (bxs of both) Dr. Oletta Lamas  . TUBAL LIGATION Bilateral 02/15/2014   Procedure: POST PARTUM TUBAL LIGATION;  Surgeon: Farrel Gobble. Harrington Challenger, MD;  Location: [facility];  Service: Gynecology;  Laterality: Bilateral;     OB History    Gravida  4   Para  4   Term  2   Preterm  2   AB      Living  4     SAB      TAB      Ectopic      Multiple      Live Births  1            Home Medications    Prior to Admission medications   Not on File    Family History Family History  Problem Relation Age of Onset  . Hypertension Mother   . Heart Problems Mother        tumor, open heart surgery  . Miscarriages / [place] Mother   . Diabetes Father   . Alcohol abuse Father   . Heart Problems Maternal Aunt        tumor  . Diabetes Paternal Grandmother   . Diabetes Maternal Grandmother   . Colon cancer Neg Hx   .  Colon polyps Neg Hx   . Kidney disease Neg Hx   . Esophageal cancer Neg Hx     Social History Social History   Tobacco Use  . Smoking status: Never Smoker  . Smokeless tobacco: Never Used  Substance Use Topics  . Alcohol use: Yes    Alcohol/week: 0.0 standard drinks    Comment: occasionally.  . Drug use: Yes    Frequency: 1.0 times per week    Types: Marijuana    Comment: states she smoked 2 weeks ago when she was overly stressed, none since then.LAST USED- 2 WEEKS AGO     Allergies   Patient has no known allergies.   Review of Systems Review of Systems  Constitutional: Negative.   HENT: Negative.   Respiratory: Negative.   Cardiovascular: Negative.   Gastrointestinal: Positive for abdominal pain.  Endocrine: Negative.   Genitourinary: Negative.   Musculoskeletal: Negative.   Skin: Negative.     Allergic/Immunologic: Negative.   Neurological: Negative.   Hematological: Negative.   Psychiatric/Behavioral: Negative.      Physical Exam Updated Vital Signs BP 130/76 (BP Location: Right Arm)   Pulse 61   Temp 98.4 F (36.9 C) (Oral)   Resp 16   Ht 5\' 4"  (1.626 m)   Wt 59 kg   LMP 07/19/2018   SpO2 98%   BMI 22.31 kg/m   Physical Exam Constitutional:      Appearance: She is well-developed and normal weight.  HENT:     Head: Normocephalic and atraumatic.     Mouth/Throat:     Mouth: Mucous membranes are moist.  Eyes:     Extraocular Movements: Extraocular movements intact.  Cardiovascular:     Rate and Rhythm: Normal rate and regular rhythm.     Heart sounds: Normal heart sounds.  Pulmonary:     Effort: Pulmonary effort is normal.     Breath sounds: Normal breath sounds.  Abdominal:     General: Abdomen is flat. Bowel sounds are normal.     Palpations: Abdomen is soft.     Tenderness: There is abdominal tenderness in the right lower quadrant.  Skin:    General: Skin is warm and dry.     Capillary Refill: Capillary refill takes less than 2 seconds.  Neurological:     General: No focal deficit present.     Mental Status: She is alert and oriented to person, place, and time.  Psychiatric:        Mood and Affect: Mood normal.      ED Treatments / Results  Labs (all labs ordered are listed, but only abnormal results are displayed) Labs Reviewed  COMPREHENSIVE METABOLIC PANEL - Abnormal; Notable for the following components:      Result Value   Albumin 3.4 (*)    All other components within normal limits  CBC - Abnormal; Notable for the following components:   RBC 3.74 (*)    Hemoglobin 9.3 (*)    HCT 30.8 (*)    MCH 24.9 (*)    RDW 17.3 (*)    All other components within normal limits  URINALYSIS, ROUTINE W REFLEX MICROSCOPIC - Abnormal; Notable for the following components:   APPearance HAZY (*)    Specific Gravity, Urine 1.031 (*)    All other  components within normal limits  LIPASE, BLOOD  I-STAT BETA HCG BLOOD, ED ([facility], [facility], [facility] ONLY)    EKG None  Radiology US Transvaginal Non-ob  Result Date: 07/26/2018 CLINICAL DATA:  Right lower quadrant pain. EXAM: TRANSABDOMINAL AND TRANSVAGINAL ULTRASOUND OF PELVIS TECHNIQUE: Both transabdominal and transvaginal ultrasound examinations of the pelvis were performed. Transabdominal technique was performed for global imaging of the pelvis including uterus, ovaries, adnexal regions, and pelvic cul-de-sac. It was necessary to proceed with endovaginal exam following the transabdominal exam to visualize the uterus, endometrium and ovaries to better advantage. COMPARISON:  Pelvic ultrasound, 12/25/2015 FINDINGS: Uterus Measurements: 11.7 x 6.1 x 5.9 cm = volume: 225 mL. There are 2 adjacent posterior mid to lower uterine segment mural fibroids measuring 3 x 2.7 cm and 3.9 x 3.3 cm, the larger the more inferior of the 2. These have mildly increased in size from the prior exam. No new or other uterine masses. Endometrium Thickness: 2 cm measured the upper uterine segment above the fibroids. This measurement is suspected to be artificially increased due to bunching up of the endometrium due to the fibroids. More inferiorly, endometrium measures 5-6 mm. No focal abnormality visualized. Right ovary Measurements: 2.7 x 2.6 x 2.2 cm = volume: 11.7 mL. Dominant 2.6 cm follicular cyst. Ovary otherwise unremarkable. No adnexal masses. Left ovary Measurements: 2.5 x 2.0 x 2.0 cm = volume: 5.7 mL. Normal appearance/no adnexal mass. Other findings No abnormal free fluid. IMPRESSION: 1. No acute findings. No findings to account for right lower quadrant pain. 2. Two posterior mural uterine fibroids, which measure larger than on the prior ultrasound. 3. Possible thickening of the endometrium in the upper uterine segment. This may simply be due to the affect of the adjacent mural fibroid. Remainder of the endometrium is normal in  thickness. Electronically Signed   By: Lajean Manes M.D.   On: 07/26/2018 20:35   US Pelvis Complete  Result Date: 07/26/2018 CLINICAL DATA:  Right lower quadrant pain. EXAM: TRANSABDOMINAL AND TRANSVAGINAL ULTRASOUND OF PELVIS TECHNIQUE: Both transabdominal and transvaginal ultrasound examinations of the pelvis were performed. Transabdominal technique was performed for global imaging of the pelvis including uterus, ovaries, adnexal regions, and pelvic cul-de-sac. It was necessary to proceed with endovaginal exam following the transabdominal exam to visualize the uterus, endometrium and ovaries to better advantage. COMPARISON:  Pelvic ultrasound, 12/25/2015 FINDINGS: Uterus Measurements: 11.7 x 6.1 x 5.9 cm = volume: 225 mL. There are 2 adjacent posterior mid to lower uterine segment mural fibroids measuring 3 x 2.7 cm and 3.9 x 3.3 cm, the larger the more inferior of the 2. These have mildly increased in size from the prior exam. No new or other uterine masses. Endometrium Thickness: 2 cm measured the upper uterine segment above the fibroids. This measurement is suspected to be artificially increased due to bunching up of the endometrium due to the fibroids. More inferiorly, endometrium measures 5-6 mm. No focal abnormality visualized. Right ovary Measurements: 2.7 x 2.6 x 2.2 cm = volume: 11.7 mL. Dominant 2.6 cm follicular cyst. Ovary otherwise unremarkable. No adnexal masses. Left ovary Measurements: 2.5 x 2.0 x 2.0 cm = volume: 5.7 mL. Normal appearance/no adnexal mass. Other findings No abnormal free fluid. IMPRESSION: 1. No acute findings. No findings to account for right lower quadrant pain. 2. Two posterior mural uterine fibroids, which measure larger than on the prior ultrasound. 3. Possible thickening of the endometrium in the upper uterine segment. This may simply be due to the affect of the adjacent mural fibroid. Remainder of the endometrium is normal in thickness. Electronically Signed   By:  Lajean Manes M.D.   On: 07/26/2018 20:35    Procedures Procedures (including critical care  time)  Medications Ordered in ED Medications  sodium chloride flush (NS) 0.9 % injection 3 mL (has no administration in time range)  ketorolac (TORADOL) 15 MG/ML injection 30 mg (30 mg Intramuscular Given 07/26/18 2101)     Initial Impression / Assessment and Plan / ED Course  I have reviewed the triage vital signs and the nursing notes.  Pertinent labs & imaging results that were available during my care of the patient were reviewed by me and considered in my medical decision making (see chart for details).   Patient is a 33 yo female who presents today for chronic RLQ/ suprapubic pain for the past 3 years. Patient has a history of fibroid and and right adnexal cyst back in 2017 which was associated with similar type pain. There was also a suspicion for pelvic congestion syndrome given patient symptoms. Patient has not followed up with OBGYN or PCP since. Symptoms today are very similar in nature. Given chronicity of symptoms, we less concern for an acute process such as ovarian torsion or rupture cyst. Could also consider PID but there are no signs of systemic infection. Pain could also be secondary to endometriosis. Transvaginal US showed 2 posterior mural uterine fibroids which are slightly bigger than last [place] in 2017. No evidence of cyst or free fluid. Based on imaging findings, labs and clinical presentation will recommend outpatient follow up with OBGYN to further discuss management of pelvic which could be secondary to enlarging fibroid. Patient will also follow up with PCP for evaluation of anemia given hemoglobin of 9.3. Likely iron deficiency in the setting abnormal uterine bleeding. Patient in agreement with plan.  Final Clinical Impressions(s) / ED Diagnoses   Final diagnoses:  Pelvic pain in female    ED Discharge Orders    None       Marjie Skiff, MD 07/26/18 2249      Lajean Saver, MD 07/27/18 1321

## 2018-07-26 NOTE — Discharge Instructions (Addendum)
Your ultrasound showed 2 fibroids that are larger than they were in 2017. Could be the cause your pain, will recommend seeing OBGYN to discuss management. You are also anemic and probably nee to be started on iron, you should talk to your primary care doctor about it.

## 2020-02-28 ENCOUNTER — Encounter (HOSPITAL_COMMUNITY): Payer: Self-pay

## 2020-02-28 ENCOUNTER — Emergency Department (HOSPITAL_COMMUNITY)
Admission: EM | Admit: 2020-02-28 | Discharge: 2020-02-29 | Disposition: A | Discharge status: Home / Self Care | Payer: Medicaid Other | Attending: Emergency Medicine | Admitting: Emergency Medicine

## 2020-02-28 ENCOUNTER — Other Ambulatory Visit: Payer: Self-pay

## 2020-02-28 DIAGNOSIS — Z5321 Procedure and treatment not carried out due to patient leaving prior to being seen by health care provider: Secondary | ICD-10-CM

## 2020-02-28 DIAGNOSIS — K0889 Other specified disorders of teeth and supporting structures: Secondary | ICD-10-CM | POA: Insufficient documentation

## 2020-02-28 NOTE — ED Notes (Signed)
Called x 1 for room. No answer. 

## 2020-02-28 NOTE — ED Triage Notes (Signed)
Pt reports R upper dental pain that started this morning after eating cereal and has progressed throughout the day. A&Ox4. Ambulatory.

## 2020-03-01 NOTE — ED Provider Notes (Signed)
Appears from nursing notes patient left without being seen.    Melanie [name], Corene Cornea, MD 03/01/20 [phone]

## 2020-04-20 ENCOUNTER — Other Ambulatory Visit: Payer: Self-pay

## 2020-04-20 ENCOUNTER — Emergency Department (HOSPITAL_COMMUNITY): Payer: Medicaid Other

## 2020-04-20 ENCOUNTER — Observation Stay (HOSPITAL_COMMUNITY)
Admission: EM | Admit: 2020-04-20 | Discharge: 2020-04-21 | Disposition: A | Discharge status: Home / Self Care | Payer: Medicaid Other | Attending: Internal Medicine | Admitting: Internal Medicine

## 2020-04-20 ENCOUNTER — Encounter (HOSPITAL_COMMUNITY): Payer: Self-pay | Admitting: Emergency Medicine

## 2020-04-20 DIAGNOSIS — Z8719 Personal history of other diseases of the digestive system: Secondary | ICD-10-CM | POA: Insufficient documentation

## 2020-04-20 DIAGNOSIS — Z23 Encounter for immunization: Secondary | ICD-10-CM | POA: Insufficient documentation

## 2020-04-20 DIAGNOSIS — E876 Hypokalemia: Secondary | ICD-10-CM

## 2020-04-20 DIAGNOSIS — Z7984 Long term (current) use of oral hypoglycemic drugs: Secondary | ICD-10-CM | POA: Insufficient documentation

## 2020-04-20 DIAGNOSIS — E119 Type 2 diabetes mellitus without complications: Secondary | ICD-10-CM | POA: Diagnosis not present

## 2020-04-20 DIAGNOSIS — Z20822 Contact with and (suspected) exposure to covid-19: Secondary | ICD-10-CM | POA: Insufficient documentation

## 2020-04-20 DIAGNOSIS — E872 Acidosis: Secondary | ICD-10-CM | POA: Diagnosis not present

## 2020-04-20 DIAGNOSIS — R112 Nausea with vomiting, unspecified: Principal | ICD-10-CM | POA: Insufficient documentation

## 2020-04-20 DIAGNOSIS — R197 Diarrhea, unspecified: Secondary | ICD-10-CM | POA: Insufficient documentation

## 2020-04-20 LAB — URINALYSIS, ROUTINE W REFLEX MICROSCOPIC
Bilirubin Urine: NEGATIVE
Glucose, UA: >=500 mg/dL — AB
Ketones, ur: 80 mg/dL — AB
Leukocytes,Ua: NEGATIVE
Nitrite: NEGATIVE
Protein, ur: 30 mg/dL — AB
Specific Gravity, Urine: 1.044 — ABNORMAL HIGH (ref 1.005–1.030)
pH: 7 (ref 5.0–8.0)

## 2020-04-20 LAB — COMPREHENSIVE METABOLIC PANEL
ALT: 21 U/L (ref 0–44)
AST: 33 U/L (ref 15–41)
Albumin: 3.9 g/dL (ref 3.5–5.0)
Alkaline Phosphatase: 83 U/L (ref 38–126)
Anion gap: 13 (ref 5–15)
BUN: 15 mg/dL (ref 6–20)
CO2: 17 mmol/L — ABNORMAL LOW (ref 22–32)
Calcium: 9.2 mg/dL (ref 8.9–10.3)
Chloride: 105 mmol/L (ref 98–111)
Creatinine, Ser: 0.81 mg/dL (ref 0.44–1.00)
GFR, Estimated: >60 mL/min (ref >60)
Glucose, Bld: 230 mg/dL — ABNORMAL HIGH (ref 70–99)
Potassium: 3.1 mmol/L — ABNORMAL LOW (ref 3.5–5.1)
Sodium: 135 mmol/L (ref 135–145)
Total Bilirubin: 0.8 mg/dL (ref 0.3–1.2)
Total Protein: 7.6 g/dL (ref 6.5–8.1)

## 2020-04-20 LAB — RENAL FUNCTION PANEL
Albumin: 4 g/dL (ref 3.5–5.0)
Anion gap: 15 (ref 5–15)
BUN: 7 mg/dL (ref 6–20)
CO2: 19 mmol/L — ABNORMAL LOW (ref 22–32)
Calcium: 9.6 mg/dL (ref 8.9–10.3)
Chloride: 105 mmol/L (ref 98–111)
Creatinine, Ser: 0.79 mg/dL (ref 0.44–1.00)
GFR, Estimated: >60 mL/min (ref >60)
Glucose, Bld: 82 mg/dL (ref 70–99)
Phosphorus: 3.6 mg/dL (ref 2.5–4.6)
Potassium: 4.3 mmol/L (ref 3.5–5.1)
Sodium: 139 mmol/L (ref 135–145)

## 2020-04-20 LAB — CBC
HCT: 38.1 % (ref 36.0–46.0)
Hemoglobin: 12.1 g/dL (ref 12.0–15.0)
MCH: 29.7 pg (ref 26.0–34.0)
MCHC: 31.8 g/dL (ref 30.0–36.0)
MCV: 93.4 fL (ref 80.0–100.0)
Platelets: 294 10*3/uL (ref 150–400)
RBC: 4.08 MIL/uL (ref 3.87–5.11)
RDW: 13 % (ref 11.5–15.5)
WBC: 6.9 10*3/uL (ref 4.0–10.5)
nRBC: 0 % (ref 0.0–0.2)

## 2020-04-20 LAB — GLUCOSE, CAPILLARY
Glucose-Capillary: 80 mg/dL (ref 70–99)
Glucose-Capillary: 88 mg/dL (ref 70–99)

## 2020-04-20 LAB — RESP PANEL BY RT PCR (RSV, FLU A&B, COVID)
Influenza A by PCR: NEGATIVE
Influenza B by PCR: NEGATIVE
Respiratory Syncytial Virus by PCR: NEGATIVE
SARS Coronavirus 2 by RT PCR: NEGATIVE

## 2020-04-20 LAB — SEDIMENTATION RATE: Sed Rate: 14 mm/hr (ref 0–22)

## 2020-04-20 LAB — MAGNESIUM: Magnesium: 1.9 mg/dL (ref 1.7–2.4)

## 2020-04-20 LAB — I-STAT BETA HCG BLOOD, ED (MC, WL, AP ONLY): I-stat hCG, quantitative: <5 m[IU]/mL (ref <5)

## 2020-04-20 LAB — LIPASE, BLOOD: Lipase: 27 U/L (ref 11–51)

## 2020-04-20 LAB — C-REACTIVE PROTEIN: CRP: <0.5 mg/dL (ref <1.0)

## 2020-04-20 MED ORDER — SODIUM CHLORIDE 0.9 % IV BOLUS
500.0000 mL | Freq: Once | INTRAVENOUS | Status: AC
  Administered 2020-04-20: 500 mL via INTRAVENOUS

## 2020-04-20 MED ORDER — METOCLOPRAMIDE HCL 5 MG/ML IJ SOLN
5.0000 mg | Freq: Four times a day (QID) | INTRAMUSCULAR | Status: DC | PRN
  Administered 2020-04-20: 5 mg via INTRAVENOUS
  Filled 2020-04-20: qty 2

## 2020-04-20 MED ORDER — PROMETHAZINE HCL 25 MG/ML IJ SOLN
25.0000 mg | Freq: Once | INTRAMUSCULAR | Status: AC
Start: 2020-04-20 — End: 2020-04-20
  Administered 2020-04-20: 25 mg via INTRAVENOUS
  Filled 2020-04-20: qty 1

## 2020-04-20 MED ORDER — INFLUENZA VAC SPLIT QUAD 0.5 ML IM SUSY
0.5000 mL | PREFILLED_SYRINGE | INTRAMUSCULAR | Status: AC
  Administered 2020-04-21: 0.5 mL via INTRAMUSCULAR
  Filled 2020-04-20: qty 0.5

## 2020-04-20 MED ORDER — ACETAMINOPHEN 325 MG PO TABS: 650.0000 mg | ORAL_TABLET | Freq: Four times a day (QID) | ORAL | Status: DC | PRN

## 2020-04-20 MED ORDER — DROPERIDOL 2.5 MG/ML IJ SOLN: 1.2500 mg | Freq: Once | INTRAMUSCULAR | Status: DC

## 2020-04-20 MED ORDER — SODIUM CHLORIDE 0.9 % IV SOLN: INTRAVENOUS | Status: DC

## 2020-04-20 MED ORDER — INSULIN ASPART 100 UNIT/ML ~~LOC~~ SOLN: 0.0000 [IU] | Freq: Three times a day (TID) | SUBCUTANEOUS | Status: DC

## 2020-04-20 MED ORDER — PNEUMOCOCCAL VAC POLYVALENT 25 MCG/0.5ML IJ INJ
0.5000 mL | INJECTION | INTRAMUSCULAR | Status: AC
  Administered 2020-04-21: 0.5 mL via INTRAMUSCULAR
  Filled 2020-04-20: qty 0.5

## 2020-04-20 MED ORDER — ACETAMINOPHEN 650 MG RE SUPP: 650.0000 mg | Freq: Four times a day (QID) | RECTAL | Status: DC | PRN

## 2020-04-20 MED ORDER — MORPHINE SULFATE (PF) 4 MG/ML IV SOLN
4.0000 mg | Freq: Once | INTRAVENOUS | Status: AC
  Administered 2020-04-20: 4 mg via INTRAVENOUS
  Filled 2020-04-20: qty 1

## 2020-04-20 MED ORDER — IOHEXOL 300 MG/ML  SOLN
100.0000 mL | Freq: Once | INTRAMUSCULAR | Status: AC | PRN
  Administered 2020-04-20: 100 mL via INTRAVENOUS

## 2020-04-20 MED ORDER — METOCLOPRAMIDE HCL 5 MG/ML IJ SOLN
5.0000 mg | Freq: Once | INTRAMUSCULAR | Status: AC
  Administered 2020-04-20: 5 mg via INTRAVENOUS
  Filled 2020-04-20: qty 2

## 2020-04-20 MED ORDER — POTASSIUM CHLORIDE 10 MEQ/100ML IV SOLN
10.0000 meq | INTRAVENOUS | Status: AC
  Administered 2020-04-20 (×2): 10 meq via INTRAVENOUS
  Filled 2020-04-20 (×2): qty 100

## 2020-04-20 MED ORDER — LIP MEDEX EX OINT
TOPICAL_OINTMENT | CUTANEOUS | Status: AC
  Administered 2020-04-20: 1
  Filled 2020-04-20: qty 7

## 2020-04-20 MED ORDER — MORPHINE SULFATE (PF) 2 MG/ML IV SOLN
2.0000 mg | INTRAVENOUS | Status: DC | PRN
  Administered 2020-04-20 – 2020-04-21 (×2): 2 mg via INTRAVENOUS
  Filled 2020-04-20 (×2): qty 1

## 2020-04-20 MED ORDER — ONDANSETRON 4 MG PO TBDP
4.0000 mg | ORAL_TABLET | Freq: Once | ORAL | Status: AC | PRN
  Administered 2020-04-20: 4 mg via ORAL
  Filled 2020-04-20: qty 1

## 2020-04-20 NOTE — Progress Notes (Signed)
Pt stable on arrival to floor. Pt did c/o nausea and was medicated. Rn also notified md of pt concern that her blood sugar would drop and he did not want to start d5 iv fluids at this time. He also did not want to start any other nausea medications other than reglan. Rn will continue to monitor.

## 2020-04-20 NOTE — ED Notes (Signed)
Pt's 1st and 2nd IV's infiltrated. Nurse is trying for a 3rd IV in order to administer reglen and draw labs.

## 2020-04-20 NOTE — ED Notes (Signed)
Pt failed PO challenge. Was given 1/4 cup of water and did not tolerate, could not keep it down.

## 2020-04-20 NOTE — Progress Notes (Signed)
  Evaluation after Contrast Extravasation  Patient seen and examined immediately after contrast extravasation while in the ED, room 2  Exam: There is moderate swelling at the right bicep area.  There is no erythema. There is no discoloration. There are no blisters. There are no signs of decreased perfusion of the skin.  It is appropriately warm to touch.  The patient has full ROM in fingers.  Radial pulse is normal.  Per contrast extravasation protocol, I have instructed the patient to keep an ice pack on the area for 20-60 minutes at a time for about 48 hours.   Keep arm elevated as much as possible.   The patient understands to call the radiology department if there is: - increase in pain or swelling - changed or altered sensation - ulceration or blistering - increasing redness - warmth or increasing firmness - decreased tissue perfusion as noted by decreased capillary refill or discoloration of skin - decreased pulses peripheral to site   Theresa Duty, NP 04/20/2020 1:12 PM

## 2020-04-20 NOTE — ED Notes (Signed)
Attempted to call report to admitting floor, admitting nurse asked to be called back in 10 minutes.

## 2020-04-20 NOTE — ED Notes (Signed)
Ultrasound IV placed in LAC

## 2020-04-20 NOTE — H&P (Addendum)
History and Physical    Melanie [name] DXI:[id number] DOB: 15-Oct-1985 DOA: 04/20/2020  PCP: Nolene Ebbs, MD  Patient coming from: Home  Chief Complaint: N/V  HPI: Melanie [name] is a 34 y.o. female with medical history significant of Crohn's. Presenting with several days of nausea that worsened in to vomiting and diarrhea last night. She denies any hematemesis. She report BRBPR but has history of hemorrhoids per her account. She did not try any medication to help with her nausea or diarrhea. She became concerned that she was having a Crohn's flare and came to the ED.   Of note, she has been seen with [facility], but it has been some time since she has checked in with them. She stopped going because she had not had a flare in a while. She reports previously taking mesalamine, but she is no longer on it.   ED Course: CT was obtained that did not show a flare of Crohns. ED staff spoke with [facility]. They recommended trending CRP, ESR before instituting Crohn's treatment. Pt continued to have nausea despite phenergan, zofran, reglan. [facility] called for admission.   Review of Systems:  Remainder of 10 point review of systems is otherwise negative for all not mentioned in HPI.   PMHx Past Medical History:  Diagnosis Date  . Anemia   . Cardiac tumor   . Crohn's disease ([place])   . Gestational diabetes   . Gestational diabetes mellitus in pregnancy   . History of genital warts     PSHx Past Surgical History:  Procedure Laterality Date  . CARDIAC SURGERY     at 34 years old  . COLONOSCOPY W/ BIOPSIES  08/2012   active ileitis, normal colon (bxs of both) Dr. Oletta Lamas  . TUBAL LIGATION Bilateral 02/15/2014   Procedure: POST PARTUM TUBAL LIGATION;  Surgeon: Farrel Gobble. Harrington Challenger, MD;  Location: [facility];  Service: Gynecology;  Laterality: Bilateral;    SocHx  reports that she has never smoked. She has never used smokeless tobacco. She reports current alcohol use. She reports current drug use. Frequency:  1.00 time per week. Drug: Marijuana.  No Known Allergies  FamHx Family History  Problem Relation Age of Onset  . Hypertension Mother   . Heart Problems Mother        tumor, open heart surgery  . Miscarriages / [place] Mother   . Diabetes Father   . Alcohol abuse Father   . Heart Problems Maternal Aunt        tumor  . Diabetes Paternal Grandmother   . Diabetes Maternal Grandmother   . Colon cancer Neg Hx   . Colon polyps Neg Hx   . Kidney disease Neg Hx   . Esophageal cancer Neg Hx     Prior to Admission medications   Medication Sig Start Date End Date Taking? Authorizing Provider  Vitamin D, Ergocalciferol, (DRISDOL) 1.25 MG (50000 UNIT) CAPS capsule Take 50,000 Units by mouth once a week. 04/19/20  Yes [provider]    Physical Exam: Vitals:   04/20/20 0744 04/20/20 0815 04/20/20 0942 04/20/20 1226  BP: 122/67 (!) 94/54 116/73 113/84  Pulse: (!) 51 (!) 42 (!) 54 80  Resp: 16  16 20   Temp: 97.8 F (36.6 C)     TempSrc: Oral     SpO2: 93% 98% 99% 98%  Weight:      Height:        General: 34 y.o. female resting in bed in NAD Eyes: PERRL, normal sclera  ENMT: Nares patent w/o discharge, orophaynx clear, dentition normal, ears w/o discharge/lesions/ulcers Neck: Supple, trachea midline Cardiovascular: brady, +S1, S2, no m/g/r, equal pulses throughout Respiratory: CTABL, no w/r/r, normal WOB GI: BS+, NDNT, no masses noted, no organomegaly noted MSK: No e/c/c Skin: No rashes, bruises, ulcerations noted Neuro: A&O x 3, no focal deficits Psyc: Appropriate interaction and affect, calm/cooperative  Labs on Admission: I have personally reviewed following labs and imaging studies  CBC: Recent Labs  Lab 04/20/20 0520  WBC 6.9  HGB 12.1  HCT 38.1  MCV 93.4  PLT 563   Basic Metabolic Panel: Recent Labs  Lab 04/20/20 0520  NA 135  K 3.1*  CL 105  CO2 17*  GLUCOSE 230*  BUN 15  CREATININE 0.81  CALCIUM 9.2  MG 1.9   GFR: Estimated Creatinine  Clearance: 86.9 mL/min (by C-G formula based on SCr of 0.81 mg/dL). Liver Function Tests: Recent Labs  Lab 04/20/20 0520  AST 33  ALT 21  ALKPHOS 83  BILITOT 0.8  PROT 7.6  ALBUMIN 3.9   Recent Labs  Lab 04/20/20 0520  LIPASE 27   No results for input(s): AMMONIA in the last 168 hours. Coagulation Profile: No results for input(s): INR, PROTIME in the last 168 hours. Cardiac Enzymes: No results for input(s): CKTOTAL, CKMB, CKMBINDEX, TROPONINI in the last 168 hours. BNP (last 3 results) No results for input(s): PROBNP in the last 8760 hours. HbA1C: No results for input(s): HGBA1C in the last 72 hours. CBG: No results for input(s): GLUCAP in the last 168 hours. Lipid Profile: No results for input(s): CHOL, HDL, LDLCALC, TRIG, CHOLHDL, LDLDIRECT in the last 72 hours. Thyroid Function Tests: No results for input(s): TSH, T4TOTAL, FREET4, T3FREE, THYROIDAB in the last 72 hours. Anemia Panel: No results for input(s): VITAMINB12, FOLATE, FERRITIN, TIBC, IRON, RETICCTPCT in the last 72 hours. Urine analysis:    Component Value Date/Time   COLORURINE YELLOW 07/26/2018 1930   APPEARANCEUR HAZY (A) 07/26/2018 1930   LABSPEC 1.031 (H) 07/26/2018 1930   PHURINE 5.0 07/26/2018 1930   GLUCOSEU NEGATIVE 07/26/2018 1930   HGBUR NEGATIVE 07/26/2018 1930   [place] NEGATIVE 07/26/2018 [place] 07/26/2018 1930   PROTEINUR NEGATIVE 07/26/2018 1930   UROBILINOGEN 0.2 05/06/2014 2344   NITRITE NEGATIVE 07/26/2018 1930   LEUKOCYTESUR NEGATIVE 07/26/2018 1930    Radiological Exams on Admission: CT ABDOMEN PELVIS WO CONTRAST  Result Date: 04/20/2020 CLINICAL DATA:  Nausea, vomiting and right abdomen pain EXAM: CT ABDOMEN AND PELVIS WITHOUT CONTRAST TECHNIQUE: Multidetector CT imaging of the abdomen and pelvis was performed following the standard protocol without IV contrast. The study was originally ordered as a contrast study but there is extravasation of IV contrast  therefore, the ER physician ordered study without contrast. COMPARISON:  December 25, 2015 FINDINGS: Lower chest: No acute abnormality. Hepatobiliary: No focal lesions identified in the liver. There is a small stone in the gallbladder. There is no inflammation surrounding the gallbladder. Biliary tree is normal. Pancreas: Unremarkable. No pancreatic ductal dilatation or surrounding inflammatory changes. Spleen: Normal in size without focal abnormality. Adrenals/Urinary Tract: Adrenal glands are unremarkable. Kidneys are normal, without renal calculi, focal lesion, or hydronephrosis. Bladder is unremarkable. Stomach/Bowel: Stomach is within normal limits. Appendix appears normal. No evidence of bowel wall thickening, distention, or inflammatory changes. Vascular/Lymphatic: No significant vascular findings are present. No enlarged abdominal or pelvic lymph nodes. Reproductive: Fluid in the endometrial canal is unchanged. Complex cyst in the right pelvis is unchanged. Small  amount of free fluid is identified in the pelvis unchanged. Other: None. Musculoskeletal: No acute or significant osseous findings. IMPRESSION: 1. No acute abnormality identified in the abdomen and pelvis. The appendix is normal. 2. Small stone in the gallbladder. No inflammation surrounding the gallbladder. 3. Complex cyst in the right pelvis is unchanged. Small amount of free fluid is identified in the pelvis unchanged. Electronically Signed   By: Abelardo Diesel M.D.   On: 04/20/2020 10:17    EKG: Independently reviewed. Sinus bradycardia  Assessment/Plan Intractable N/V Hx of Crohns     - admit to obs, med-surg     - bowel rest     - reglan, fluids     - CT w/o evidence of Crohn's flare, monitor ESR, CRP per GI recommendation     - PRN pain meds  Hypokalemia     - give K+     - Mg2+ is ok     - follow AM labs  DM2     - check A1c     - NPO for now     - SSI very sensative, glucose checks     - she does not normally take DM  medications  DVT prophylaxis: SCDs Code Status: FULL  Family Communication: With family at bedisde  Consults called: EDP called GI for recs  Admission status: Observation.  Status is: Observation  The patient remains OBS appropriate and will d/c before 2 midnights.  Dispo: The patient is from: Home              Anticipated d/c is to: Home              Anticipated d/c date is: 1 day              Patient currently is not medically stable to d/c.  Time spent coordinating admission: [facility]  If 7PM-7AM, please contact night-coverage [email or web address]  04/20/2020, 1:58 PM

## 2020-04-20 NOTE — Plan of Care (Signed)
°  Problem: Clinical Measurements: Goal: Respiratory complications will improve Outcome: Progressing   Problem: Activity: Goal: Risk for activity intolerance will decrease Outcome: Progressing   Problem: Nutrition: Goal: Adequate nutrition will be maintained Outcome: Progressing   Problem: Elimination: Goal: Will not experience complications related to bowel motility Outcome: Progressing   Problem: Skin Integrity: Goal: Risk for impaired skin integrity will decrease Outcome: Progressing

## 2020-04-20 NOTE — ED Notes (Signed)
Pt right arm still swollen and cool to the touch but not painful.

## 2020-04-20 NOTE — ED Provider Notes (Signed)
[facility] DEPT Provider Note   CSN: [id number] Arrival date & time: 04/20/20  0440     History Chief Complaint  Patient presents with  . Emesis    Melanie [name] is a 34 y.o. female.  HPI   Patient with significant medical history of anemia, Crohn's disease, diabetes presents emerged department chief complaint of sudden onset of nausea vomiting and diarrhea.  Patient states this all started last night she had multiple episodes of vomiting as well as diarrhea.  She states she denies seeing any blood in her vomit but does state she had some bright red blood on her toilet paper.  She states this feels exactly Crohn's flareup.  She is currently not on any medication for Crohn's has not seen a GI doctor in years because she has not had a flareup in a long time.  Patient does endorse that she has some fevers and chills, nasal congestion but denies cough, chest pain, recent sick contacts.  After reviewing patient's notes she was seen by Dr. Myrtice Lauth at [facility], and that she was on pentasa and they performed a colonoscopy which showed multiple moderate to severe inflammation in the ileum.  She has not seen them since 2015.  Patient denies headache, sore throat, cough, chest pain, dysuria worsening pedal edema.  Past Medical History:  Diagnosis Date  . Anemia   . Cardiac tumor   . Crohn's disease ([place])   . Gestational diabetes   . Gestational diabetes mellitus in pregnancy   . History of genital warts     Patient Active Problem List   Diagnosis Date Noted  . Intractable nausea and vomiting 04/20/2020  . Crohn's disease ([place]) 05/07/2014  . Colitis 05/07/2014  . Pregnant 02/14/2014  . Supervision of other normal pregnancy 02/14/2014  . Gestational diabetes mellitus, class A2 01/30/2014  . 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 Antepartum bleeding, second trimester 10/15/2013  . Acute pericarditis, unspecified 09/12/2013    . 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 Nausea vomiting and diarrhea 06/15/2012  . Dehydration 06/15/2012  . Acute colitis 06/15/2012  . Hypokalemia 06/15/2012  . Preterm premature rupture of membranes 04/16/2011  . Gestational diabetes 04/16/2011  . SVD (spontaneous vaginal delivery) 04/16/2011    Past Surgical History:  Procedure Laterality Date  . CARDIAC SURGERY     at 34 years old  . COLONOSCOPY W/ BIOPSIES  08/2012   active ileitis, normal colon (bxs of both) Dr. Oletta Lamas  . TUBAL LIGATION Bilateral 02/15/2014   Procedure: POST PARTUM TUBAL LIGATION;  Surgeon: Farrel Gobble. Harrington Challenger, MD;  Location: [facility];  Service: Gynecology;  Laterality: Bilateral;     OB History    Gravida  4   Para  4   Term  2   Preterm  2   AB      Living  4     SAB      TAB      Ectopic      Multiple      Live Births  1           Family History  Problem Relation Age of Onset  . Hypertension Mother   . Heart Problems Mother        tumor, open heart surgery  . Miscarriages / [place] Mother   . Diabetes Father   . Alcohol abuse Father   . Heart Problems Maternal Aunt  tumor  . Diabetes Paternal Grandmother   . Diabetes Maternal Grandmother   . Colon cancer Neg Hx   . Colon polyps Neg Hx   . Kidney disease Neg Hx   . Esophageal cancer Neg Hx     Social History   Tobacco Use  . Smoking status: Never Smoker  . Smokeless tobacco: Never Used  Substance Use Topics  . Alcohol use: Yes    Alcohol/week: 0.0 standard drinks    Comment: occasionally.  . Drug use: Yes    Frequency: 1.0 times per week    Types: Marijuana    Comment: states she smoked 2 weeks ago when she was overly stressed, none since then.LAST USED- 2 WEEKS AGO    Home Medications Prior to Admission medications   Medication Sig Start Date End Date Taking? Authorizing Provider  Vitamin D, Ergocalciferol, (DRISDOL) 1.25 MG (50000 UNIT) CAPS capsule Take 50,000 Units by mouth once a week.  04/19/20  Yes [provider]    Allergies    Patient has no known allergies.  Review of Systems   Review of Systems  Physical Exam Updated Vital Signs BP 113/84 (BP Location: Right Arm)   Pulse 80   Temp 97.8 F (36.6 C) (Oral)   Resp 20   Ht 5\' 4"  (1.626 m)   Wt 59 kg   LMP 04/17/2020 Comment: neg upt  SpO2 98%   BMI 22.31 kg/m   Physical Exam  ED Results / Procedures / Treatments   Labs (all labs ordered are listed, but only abnormal results are displayed) Labs Reviewed  COMPREHENSIVE METABOLIC PANEL - Abnormal; Notable for the following components:      Result Value   Potassium 3.1 (*)    CO2 17 (*)    Glucose, Bld 230 (*)    All other components within normal limits  RESP PANEL BY RT PCR (RSV, FLU A&B, COVID)  LIPASE, BLOOD  CBC  MAGNESIUM  SEDIMENTATION RATE  URINALYSIS, ROUTINE W REFLEX MICROSCOPIC  C-REACTIVE PROTEIN  I-STAT BETA HCG BLOOD, ED ([facility], [facility], [facility] ONLY)    EKG None  Radiology CT ABDOMEN PELVIS WO CONTRAST  Result Date: 04/20/2020 CLINICAL DATA:  Nausea, vomiting and right abdomen pain EXAM: CT ABDOMEN AND PELVIS WITHOUT CONTRAST TECHNIQUE: Multidetector CT imaging of the abdomen and pelvis was performed following the standard protocol without IV contrast. The study was originally ordered as a contrast study but there is extravasation of IV contrast therefore, the ER physician ordered study without contrast. COMPARISON:  December 25, 2015 FINDINGS: Lower chest: No acute abnormality. Hepatobiliary: No focal lesions identified in the liver. There is a small stone in the gallbladder. There is no inflammation surrounding the gallbladder. Biliary tree is normal. Pancreas: Unremarkable. No pancreatic ductal dilatation or surrounding inflammatory changes. Spleen: Normal in size without focal abnormality. Adrenals/Urinary Tract: Adrenal glands are unremarkable. Kidneys are normal, without renal calculi, focal lesion, or hydronephrosis. Bladder is  unremarkable. Stomach/Bowel: Stomach is within normal limits. Appendix appears normal. No evidence of bowel wall thickening, distention, or inflammatory changes. Vascular/Lymphatic: No significant vascular findings are present. No enlarged abdominal or pelvic lymph nodes. Reproductive: Fluid in the endometrial canal is unchanged. Complex cyst in the right pelvis is unchanged. Small amount of free fluid is identified in the pelvis unchanged. Other: None. Musculoskeletal: No acute or significant osseous findings. IMPRESSION: 1. No acute abnormality identified in the abdomen and pelvis. The appendix is normal. 2. Small stone in the gallbladder. No inflammation surrounding the gallbladder.  3. Complex cyst in the right pelvis is unchanged. Small amount of free fluid is identified in the pelvis unchanged. Electronically Signed   By: Abelardo Diesel M.D.   On: 04/20/2020 10:17    Procedures Procedures (including critical care time)  Medications Ordered in ED Medications  potassium chloride 10 mEq in 100 mL IVPB (10 mEq Intravenous New Bag/Given 04/20/20 1321)  ondansetron (ZOFRAN-ODT) disintegrating tablet 4 mg (4 mg Oral Given 04/20/20 0530)  promethazine (PHENERGAN) injection 25 mg (25 mg Intravenous Given 04/20/20 0840)  sodium chloride 0.9 % bolus 500 mL (0 mLs Intravenous Stopped 04/20/20 0956)  morphine 4 MG/ML injection 4 mg (4 mg Intravenous Given 04/20/20 1041)  iohexol (OMNIPAQUE) 300 MG/ML solution 100 mL (100 mLs Intravenous Contrast Given 04/20/20 0914)  metoCLOPramide (REGLAN) injection 5 mg (5 mg Intravenous Given 04/20/20 1241)    ED Course  I have reviewed the triage vital signs and the nursing notes.  Pertinent labs & imaging results that were available during my care of the patient were reviewed by me and considered in my medical decision making (see chart for details).    MDM Rules/Calculators/A&P                          Patient presents with nausea, vomiting, diarrhea.  She  appeared to be in acute distress, vital signs significant for bradycardia.  Will order screening labs, provide patient with fluids as well as antiemetics.  CBC negative for leukocytosis or signs anemia, CMP showing hypokalemia 3.1, metabolic acidosis with a bicarb of 17, hyperglycemia of 230, no AKI, no anion gap.  Lipase 27, hCG less than 5.  CT abdomen pelvis not show any acute abnormalities, shows unchanged complex cyst in the right.  Will start patient on IV potassium.   Due to her history of Crohn's disease, consulted with GI for further recommendation evaluation.  Spoke with PA Ellouise Newer who recommends that we order CRP as well as sed rate and hold off on treating her for Crohn's disease since we do not see any evidence on CT scan.   Patient was reevaluated after providing her with Phenergan as well as well as Reglan.  She continues to have nausea and vomiting.  Will consult with hospitalist for admission.  Spoke with Dr. [name] who has kept the patient and will come down and evaluate her.  Low suspicion for systemic infection as patient was nontoxic-appearing, vital signs reassuring, no obvious source of infection noted on exam or lab work.  Low suspicion for ACS or heart block as patient denies chest pain, shortness of breath, no signs of hypoperfusion or fluid overload on exam, EKG negative for ischemia or signs of heart block.  Low suspicion for intra-abdominal abnormality requiring surgical intervention as CT abdomen pelvis not show any acute findings.  Low suspicion for AAA as patient has little risk factors and history would be atypical for it.  I suspect patient suffering from gastroenteritis but possible she might be suffering from acute Crohn's flareup.  Anticipate she will need continued IV fluids as well as antiemetics and be evaluated by GI for further evaluation.  Patient vitals have remained stable, she appears to be comfortable, patient care will be transferred to hospice team  for further evaluation. Final Clinical Impression(s) / ED Diagnoses Final diagnoses:  Intractable vomiting with nausea, unspecified vomiting type    Rx / DC Orders ED Discharge Orders    None  Marcello Fennel, PA-C 04/20/20 1333    Milton Ferguson, MD 04/25/20 1106

## 2020-04-20 NOTE — ED Triage Notes (Signed)
Pt reports having nausea, diarrhea, and vomiting that started tonight.

## 2020-04-20 NOTE — Progress Notes (Signed)
Patient had 20 g in patients right AC.  IV was hand flushed and power flushed with the injector.  Patient never reported any pain during test injections. Contrast observed during injection and felt during injection by this Technologist, patient again never complained of any pain.  After scan completed patient stated "that her arm hurt in her upper bicep area"  100 cc of omni 300 in her right bicep, patient advised to keep arm elevated and ice  Orders placed in epic and instructions give to Deno Etienne ED PA and Doreene Eland RN.  Soyla Dryer NP for radiology notified

## 2020-04-21 DIAGNOSIS — R112 Nausea with vomiting, unspecified: Secondary | ICD-10-CM

## 2020-04-21 LAB — BASIC METABOLIC PANEL
Anion gap: 9 (ref 5–15)
BUN: 12 mg/dL (ref 6–20)
CO2: 25 mmol/L (ref 22–32)
Calcium: 9.4 mg/dL (ref 8.9–10.3)
Chloride: 105 mmol/L (ref 98–111)
Creatinine, Ser: 0.85 mg/dL (ref 0.44–1.00)
GFR, Estimated: >60 mL/min (ref >60)
Glucose, Bld: 100 mg/dL — ABNORMAL HIGH (ref 70–99)
Potassium: 3.7 mmol/L (ref 3.5–5.1)
Sodium: 139 mmol/L (ref 135–145)

## 2020-04-21 LAB — COMPREHENSIVE METABOLIC PANEL
ALT: 22 U/L (ref 0–44)
AST: 32 U/L (ref 15–41)
Albumin: 4.3 g/dL (ref 3.5–5.0)
Alkaline Phosphatase: 82 U/L (ref 38–126)
Anion gap: 13 (ref 5–15)
BUN: 8 mg/dL (ref 6–20)
CO2: 16 mmol/L — ABNORMAL LOW (ref 22–32)
Calcium: 9.4 mg/dL (ref 8.9–10.3)
Chloride: 106 mmol/L (ref 98–111)
Creatinine, Ser: 0.91 mg/dL (ref 0.44–1.00)
GFR, Estimated: >60 mL/min (ref >60)
Glucose, Bld: 132 mg/dL — ABNORMAL HIGH (ref 70–99)
Potassium: 3.7 mmol/L (ref 3.5–5.1)
Sodium: 135 mmol/L (ref 135–145)
Total Bilirubin: 1 mg/dL (ref 0.3–1.2)
Total Protein: 8.1 g/dL (ref 6.5–8.1)

## 2020-04-21 LAB — HIV ANTIBODY (ROUTINE TESTING W REFLEX): HIV Screen 4th Generation wRfx: NONREACTIVE

## 2020-04-21 LAB — CBC
HCT: 41.8 % (ref 36.0–46.0)
Hemoglobin: 13.2 g/dL (ref 12.0–15.0)
MCH: 29.7 pg (ref 26.0–34.0)
MCHC: 31.6 g/dL (ref 30.0–36.0)
MCV: 94.1 fL (ref 80.0–100.0)
Platelets: 318 10*3/uL (ref 150–400)
RBC: 4.44 MIL/uL (ref 3.87–5.11)
RDW: 13.2 % (ref 11.5–15.5)
WBC: 7.3 10*3/uL (ref 4.0–10.5)
nRBC: 0 % (ref 0.0–0.2)

## 2020-04-21 LAB — HEMOGLOBIN A1C
Hgb A1c MFr Bld: 5.4 % (ref 4.8–5.6)
Mean Plasma Glucose: 108.28 mg/dL

## 2020-04-21 LAB — GLUCOSE, CAPILLARY
Glucose-Capillary: 120 mg/dL — ABNORMAL HIGH (ref 70–99)
Glucose-Capillary: 92 mg/dL (ref 70–99)
Glucose-Capillary: 85 mg/dL (ref 70–99)

## 2020-04-21 MED ORDER — ONDANSETRON HCL 4 MG PO TABS: 4.0000 mg | ORAL_TABLET | Freq: Every day | ORAL | 0 refills | Status: AC | PRN

## 2020-04-21 MED ORDER — LACTATED RINGERS IV BOLUS
1000.0000 mL | Freq: Once | INTRAVENOUS | Status: AC
  Administered 2020-04-21: 1000 mL via INTRAVENOUS

## 2020-04-21 MED ORDER — FAMOTIDINE 40 MG PO TABS: 40.0000 mg | ORAL_TABLET | Freq: Every evening | ORAL | 0 refills | Status: DC

## 2020-04-21 NOTE — Discharge Summary (Signed)
Physician Discharge Summary  Melanie [name]:[id number] DOB: 1986/04/18 DOA: 04/20/2020  PCP: Nolene Ebbs, MD  Admit date: 04/20/2020 Discharge date: 04/21/2020  Admitted From: home Disposition:  home  Recommendations for Outpatient Follow-up:  1. Follow up with PCP in 1-2 weeks 2. Please obtain BMP/CBC in one week 3. Please follow up on the following pending results:  Home Health:no  Equipment/Devices: none  Discharge Condition: Stable Code Status:   Code Status: Full Code Diet recommendation:  Diet Order            Diet Carb Modified Fluid consistency: Thin; Room service appropriate? Yes  Diet effective now           Diet Carb Modified                  Brief/Interim Summary: As per admitting: Melanie [name] is a 34 y.o. female with medical history significant of Crohn's. Presenting with several days of nausea that worsened in to vomiting and diarrhea last night. She denies any hematemesis. She report BRBPR but has history of hemorrhoids per her account. She did not try any medication to help with her nausea or diarrhea. She became concerned that she was having a Crohn's flare and came to the ED.  Of note, she has been seen with [facility], but it has been some time since she has checked in with them. She stopped going because she had not had a flare in a while. She reports previously taking mesalamine, but she is no longer on it.  ED Course: CT was obtained that did not show a flare of Crohns. ED staff spoke with [facility]. They recommended trending CRP, ESR before instituting Crohn's treatment. Pt continued to have nausea despite phenergan, zofran, reglan. [facility] called for admission.  Lab work showed normal ESR CRP CT abdomen no acute finding. Admitted for nausea and vomiting diet was slowly advanced.  Symptoms resolved.  Patient feels like her symptoms were from the food she ingested. 2 letter to go home today as she has tolerated diet well.-Instructed her to follow-up with  [facility]  Discharge Diagnoses:   Intractable nausea vomiting unclear etiology.  Symptoms resolved.  Tolerating diet.  Continue on Zofran supportive measures.  History of Crohn's disease stable by CT scan and ESR CRP are normal GI was consulted on admission.  I have instructed her to follow-up with GI doctor. History of current diabetes but hemoglobin A1c is normal. Metabolic acidosis likely from nausea vomiting, resolved with IV fluidS.  BMP was repeated prior to discharge. Hypokalemia is resolved.  Consults:  GI over the phone.  Subjective: Resting well.  She tolerated regular cardiac diet no nausea vomiting.  Would like to go home today.  Discharge Exam: Vitals:   04/21/20 0011 04/21/20 0411  BP: 132/77 118/61  Pulse: (!) 47 (!) 59  Resp: 14 16  Temp: 98.6 F (37 C) 98.2 F (36.8 C)  SpO2: 100% 100%   General: Pt is alert, awake, not in acute distress Cardiovascular: RRR, S1/S2 +, no rubs, no gallops Respiratory: CTA bilaterally, no wheezing, no rhonchi Abdominal: Soft, NT, ND, bowel sounds + Extremities: no edema, no cyanosis  Discharge Instructions  Discharge Instructions    Diet Carb Modified   Complete by: As directed    Discharge instructions   Complete by: As directed    Please call the gastroenterology DOCTOR OFFICE in number provided for follow-up   Increase activity slowly   Complete by: As directed  Allergies as of 04/21/2020   No Known Allergies     Medication List    TAKE these medications   famotidine 40 MG tablet Commonly known as: PEPCID Take 1 tablet (40 mg total) by mouth every evening.   ondansetron 4 MG tablet Commonly known as: Zofran Take 1 tablet (4 mg total) by mouth daily as needed for nausea or vomiting.   Vitamin D (Ergocalciferol) 1.25 MG (50000 UNIT) Caps capsule Commonly known as: DRISDOL Take 50,000 Units by mouth once a week.       Follow-up Information    Nolene Ebbs, MD Follow up in 1 week(s).   Specialty:  Internal Medicine Contact information: [place] [address] [phone]        Vena Rua, PA-C. Call in 1 week(s).   Specialty: Gastroenterology Why: Please follow-up [facility] Contact information: [address] [place] [address] [phone]              No Known Allergies  The results of significant diagnostics from this hospitalization (including imaging, microbiology, ancillary and laboratory) are listed below for reference.    Microbiology: Recent Results (from the past 240 hour(s))  Resp Panel by RT PCR (RSV, Flu A&B, Covid) - Nasopharyngeal Swab     Status: None   Collection Time: 04/20/20  1:22 PM   Specimen: Nasopharyngeal Swab  Result Value Ref Range Status   SARS Coronavirus 2 by RT PCR NEGATIVE NEGATIVE Final    Comment: (NOTE) SARS-CoV-2 target nucleic acids are NOT DETECTED.  The SARS-CoV-2 RNA is generally detectable in upper respiratoy specimens during the acute phase of infection. The lowest concentration of SARS-CoV-2 viral copies this assay can detect is 131 copies/mL. A negative result does not preclude SARS-Cov-2 infection and should not be used as the sole basis for treatment or other patient management decisions. A negative result may occur with  improper specimen collection/handling, submission of specimen other than nasopharyngeal swab, presence of viral mutation(s) within the areas targeted by this assay, and inadequate number of viral copies (<131 copies/mL). A negative result must be combined with clinical observations, patient history, and epidemiological information. The expected result is Negative.  Fact Sheet for Patients:  PinkCheek.be  Fact Sheet for Healthcare Providers:  GravelBags.it  This test is no t yet approved or cleared by the [place] FDA and  has been authorized for detection and/or diagnosis of SARS-CoV-2 by FDA under  an Emergency Use Authorization (EUA). This EUA will remain  in effect (meaning this test can be used) for the duration of the COVID-19 declaration under Section 564(b)(1) of the Act, 21 U.S.C. section 360bbb-3(b)(1), unless the authorization is terminated or revoked sooner.     Influenza A by PCR NEGATIVE NEGATIVE Final   Influenza B by PCR NEGATIVE NEGATIVE Final    Comment: (NOTE) The Xpert Xpress SARS-CoV-2/FLU/RSV assay is intended as an aid in  the diagnosis of influenza from Nasopharyngeal swab specimens and  should not be used as a sole basis for treatment. Nasal washings and  aspirates are unacceptable for Xpert Xpress SARS-CoV-2/FLU/RSV  testing.  Fact Sheet for Patients: PinkCheek.be  Fact Sheet for Healthcare Providers: GravelBags.it  This test is not yet approved or cleared by the [place] FDA and  has been authorized for detection and/or diagnosis of SARS-CoV-2 by  FDA under an Emergency Use Authorization (EUA). This EUA will remain  in effect (meaning this test can be used) for the duration of the  Covid-19  declaration under Section 564(b)(1) of the Act, 21  U.S.C. section 360bbb-3(b)(1), unless the authorization is  terminated or revoked.    Respiratory Syncytial Virus by PCR NEGATIVE NEGATIVE Final    Comment: (NOTE) Fact Sheet for Patients: PinkCheek.be  Fact Sheet for Healthcare Providers: GravelBags.it  This test is not yet approved or cleared by the [place] FDA and  has been authorized for detection and/or diagnosis of SARS-CoV-2 by  FDA under an Emergency Use Authorization (EUA). This EUA will remain  in effect (meaning this test can be used) for the duration of the  COVID-19 declaration under Section 564(b)(1) of the Act, 21 U.S.C.  section 360bbb-3(b)(1), unless the authorization is terminated or  revoked. Performed at [facility], [place] [address], [place], [place] [address]     Procedures/Studies: CT ABDOMEN PELVIS WO CONTRAST  Result Date: 04/20/2020 CLINICAL DATA:  Nausea, vomiting and right abdomen pain EXAM: CT ABDOMEN AND PELVIS WITHOUT CONTRAST TECHNIQUE: Multidetector CT imaging of the abdomen and pelvis was performed following the standard protocol without IV contrast. The study was originally ordered as a contrast study but there is extravasation of IV contrast therefore, the ER physician ordered study without contrast. COMPARISON:  December 25, 2015 FINDINGS: Lower chest: No acute abnormality. Hepatobiliary: No focal lesions identified in the liver. There is a small stone in the gallbladder. There is no inflammation surrounding the gallbladder. Biliary tree is normal. Pancreas: Unremarkable. No pancreatic ductal dilatation or surrounding inflammatory changes. Spleen: Normal in size without focal abnormality. Adrenals/Urinary Tract: Adrenal glands are unremarkable. Kidneys are normal, without renal calculi, focal lesion, or hydronephrosis. Bladder is unremarkable. Stomach/Bowel: Stomach is within normal limits. Appendix appears normal. No evidence of bowel wall thickening, distention, or inflammatory changes. Vascular/Lymphatic: No significant vascular findings are present. No enlarged abdominal or pelvic lymph nodes. Reproductive: Fluid in the endometrial canal is unchanged. Complex cyst in the right pelvis is unchanged. Small amount of free fluid is identified in the pelvis unchanged. Other: None. Musculoskeletal: No acute or significant osseous findings. IMPRESSION: 1. No acute abnormality identified in the abdomen and pelvis. The appendix is normal. 2. Small stone in the gallbladder. No inflammation surrounding the gallbladder. 3. Complex cyst in the right pelvis is unchanged. Small amount of free fluid is identified in the pelvis unchanged. Electronically Signed   By: Abelardo Diesel M.D.   On:  04/20/2020 10:17    Labs: BNP (last 3 results) No results for input(s): BNP in the last 8760 hours. Basic Metabolic Panel: Recent Labs  Lab 04/20/20 0520 04/20/20 2003 04/21/20 0659 04/21/20 1424  NA 135 139 135 139  K 3.1* 4.3 3.7 3.7  CL 105 105 106 105  CO2 17* 19* 16* 25  GLUCOSE 230* 82 132* 100*  BUN 15 7 8 12   CREATININE 0.81 0.79 0.91 0.85  CALCIUM 9.2 9.6 9.4 9.4  MG 1.9  --   --   --   PHOS  --  3.6  --   --    Liver Function Tests: Recent Labs  Lab 04/20/20 0520 04/20/20 2003 04/21/20 0659  AST 33  --  32  ALT 21  --  22  ALKPHOS 83  --  82  BILITOT 0.8  --  1.0  PROT 7.6  --  8.1  ALBUMIN 3.9 4.0 4.3   Recent Labs  Lab 04/20/20 0520  LIPASE 27   No results for input(s): AMMONIA in the last 168 hours. CBC: Recent Labs  Lab  04/20/20 0520 04/21/20 0659  WBC 6.9 7.3  HGB 12.1 13.2  HCT 38.1 41.8  MCV 93.4 94.1  PLT 294 318   Cardiac Enzymes: No results for input(s): CKTOTAL, CKMB, CKMBINDEX, TROPONINI in the last 168 hours. BNP: Invalid input(s): POCBNP CBG: Recent Labs  Lab 04/20/20 1718 04/20/20 2038 04/21/20 0009 04/21/20 0728 04/21/20 1138  GLUCAP 80 88 92 120* 85   D-Dimer No results for input(s): DDIMER in the last 72 hours. Hgb A1c Recent Labs    04/21/20 0659  HGBA1C 5.4   Lipid Profile No results for input(s): CHOL, HDL, LDLCALC, TRIG, CHOLHDL, LDLDIRECT in the last 72 hours. Thyroid function studies No results for input(s): TSH, T4TOTAL, T3FREE, THYROIDAB in the last 72 hours.  Invalid input(s): FREET3 Anemia work up No results for input(s): VITAMINB12, FOLATE, FERRITIN, TIBC, IRON, RETICCTPCT in the last 72 hours. Urinalysis    Component Value Date/Time   COLORURINE YELLOW 04/20/2020 0520   APPEARANCEUR CLEAR 04/20/2020 0520   LABSPEC 1.044 (H) 04/20/2020 0520   PHURINE 7.0 04/20/2020 0520   GLUCOSEU >=500 (A) 04/20/2020 0520   HGBUR SMALL (A) 04/20/2020 0520   BILIRUBINUR NEGATIVE 04/20/2020 0520    KETONESUR 80 (A) 04/20/2020 0520   PROTEINUR 30 (A) 04/20/2020 0520   UROBILINOGEN 0.2 05/06/2014 2344   NITRITE NEGATIVE 04/20/2020 0520   LEUKOCYTESUR NEGATIVE 04/20/2020 0520   Sepsis Labs Invalid input(s): PROCALCITONIN,  WBC,  LACTICIDVEN Microbiology Recent Results (from the past 240 hour(s))  Resp Panel by RT PCR (RSV, Flu A&B, Covid) - Nasopharyngeal Swab     Status: None   Collection Time: 04/20/20  1:22 PM   Specimen: Nasopharyngeal Swab  Result Value Ref Range Status   SARS Coronavirus 2 by RT PCR NEGATIVE NEGATIVE Final    Comment: (NOTE) SARS-CoV-2 target nucleic acids are NOT DETECTED.  The SARS-CoV-2 RNA is generally detectable in upper respiratoy specimens during the acute phase of infection. The lowest concentration of SARS-CoV-2 viral copies this assay can detect is 131 copies/mL. A negative result does not preclude SARS-Cov-2 infection and should not be used as the sole basis for treatment or other patient management decisions. A negative result may occur with  improper specimen collection/handling, submission of specimen other than nasopharyngeal swab, presence of viral mutation(s) within the areas targeted by this assay, and inadequate number of viral copies (<131 copies/mL). A negative result must be combined with clinical observations, patient history, and epidemiological information. The expected result is Negative.  Fact Sheet for Patients:  PinkCheek.be  Fact Sheet for Healthcare Providers:  GravelBags.it  This test is no t yet approved or cleared by the [place] FDA and  has been authorized for detection and/or diagnosis of SARS-CoV-2 by FDA under an Emergency Use Authorization (EUA). This EUA will remain  in effect (meaning this test can be used) for the duration of the COVID-19 declaration under Section 564(b)(1) of the Act, 21 U.S.C. section 360bbb-3(b)(1), unless the authorization is  terminated or revoked sooner.     Influenza A by PCR NEGATIVE NEGATIVE Final   Influenza B by PCR NEGATIVE NEGATIVE Final    Comment: (NOTE) The Xpert Xpress SARS-CoV-2/FLU/RSV assay is intended as an aid in  the diagnosis of influenza from Nasopharyngeal swab specimens and  should not be used as a sole basis for treatment. Nasal washings and  aspirates are unacceptable for Xpert Xpress SARS-CoV-2/FLU/RSV  testing.  Fact Sheet for Patients: PinkCheek.be  Fact Sheet for Healthcare Providers: GravelBags.it  This test is not  yet approved or cleared by the [place] and  has been authorized for detection and/or diagnosis of SARS-CoV-2 by  FDA under an Emergency Use Authorization (EUA). This EUA will remain  in effect (meaning this test can be used) for the duration of the  Covid-19 declaration under Section 564(b)(1) of the Act, 21  U.S.C. section 360bbb-3(b)(1), unless the authorization is  terminated or revoked.    Respiratory Syncytial Virus by PCR NEGATIVE NEGATIVE Final    Comment: (NOTE) Fact Sheet for Patients: PinkCheek.be  Fact Sheet for Healthcare Providers: GravelBags.it  This test is not yet approved or cleared by the [place] FDA and  has been authorized for detection and/or diagnosis of SARS-CoV-2 by  FDA under an Emergency Use Authorization (EUA). This EUA will remain  in effect (meaning this test can be used) for the duration of the  COVID-19 declaration under Section 564(b)(1) of the Act, 21 U.S.C.  section 360bbb-3(b)(1), unless the authorization is terminated or  revoked. Performed at [facility] [place], [place] [address] [place], [place] [address]      Time coordinating discharge: 25 minutes  SIGNED: Antonieta Pert, MD  [facility] 04/21/2020, 3:12 PM  If 7PM-7AM, please contact  night-coverage [email or web address]

## 2020-04-21 NOTE — Progress Notes (Signed)
During bedside shift report, patient reports her IV came out. Patient is a difficult/hard stick, placed IV team consult in for an IV start.

## 2020-04-21 NOTE — Progress Notes (Signed)
Patient ID: Melanie [name], female   DOB: 08-22-1985, 34 y.o.   MRN: [id number] Pt's previous IV acces site rt antecubital fossa clean and dry; no sig skin changes, erythema, or worsening pain/parethesias noted post contrast extravasation to area; some mild edema rt bicep region but soft; cont with conservative measures, ice pack to region prn.

## 2020-04-21 NOTE — Progress Notes (Signed)
Ice pack applied to patients right upper arm where previous IV site infiltrated in the ER. Site is slightly swollen with redness noted.

## 2020-04-23 ENCOUNTER — Encounter: Payer: Self-pay | Admitting: Gastroenterology

## 2020-05-15 ENCOUNTER — Ambulatory Visit: Payer: Medicaid Other | Admitting: Gastroenterology

## 2020-06-02 ENCOUNTER — Emergency Department (HOSPITAL_COMMUNITY)
Admission: EM | Admit: 2020-06-02 | Discharge: 2020-06-02 | Disposition: A | Discharge status: Home / Self Care | Payer: Medicaid Other | Attending: Emergency Medicine | Admitting: Emergency Medicine

## 2020-06-02 ENCOUNTER — Encounter (HOSPITAL_COMMUNITY): Payer: Self-pay | Admitting: Emergency Medicine

## 2020-06-02 DIAGNOSIS — K0889 Other specified disorders of teeth and supporting structures: Secondary | ICD-10-CM | POA: Diagnosis present

## 2020-06-02 DIAGNOSIS — K011 Impacted teeth: Secondary | ICD-10-CM

## 2020-06-02 MED ORDER — OXYCODONE-ACETAMINOPHEN 5-325 MG PO TABS
2.0000 | ORAL_TABLET | Freq: Once | ORAL | Status: AC
  Administered 2020-06-02: 2 via ORAL
  Filled 2020-06-02: qty 2

## 2020-06-02 MED ORDER — HYDROCODONE-ACETAMINOPHEN 5-325 MG PO TABS: 2.0000 | ORAL_TABLET | ORAL | 0 refills | Status: DC | PRN

## 2020-06-02 NOTE — ED Provider Notes (Signed)
[facility] EMERGENCY DEPARTMENT Provider Note   CSN: [id number] Arrival date & time: 06/02/20  9024     History Chief Complaint  Patient presents with  . Dental Problem    Melanie [name] is a 34 y.o. female.  34 year old female presents complaining of right upper third molar tooth pain.  Has history of same and been on something and the pain became sharp and severe and started yesterday.  No fever or chills.  No drainage noted.  Has been using ibuprofen without relief.  Nothing makes her symptoms better        Past Medical History:  Diagnosis Date  . Anemia   . Cardiac tumor   . Crohn's disease ([place])   . Gestational diabetes   . Gestational diabetes mellitus in pregnancy   . History of genital warts     Patient Active Problem List   Diagnosis Date Noted  . Intractable nausea and vomiting 04/20/2020  . Crohn's disease ([place]) 05/07/2014  . Colitis 05/07/2014  . Pregnant 02/14/2014  . Supervision of other normal pregnancy 02/14/2014  . Gestational diabetes mellitus, class A2 01/30/2014  . 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 Antepartum bleeding, second trimester 10/15/2013  . Acute pericarditis, unspecified 09/12/2013  . 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 Nausea vomiting and diarrhea 06/15/2012  . Dehydration 06/15/2012  . Acute colitis 06/15/2012  . Hypokalemia 06/15/2012  . Preterm premature rupture of membranes 04/16/2011  . Gestational diabetes 04/16/2011  . SVD (spontaneous vaginal delivery) 04/16/2011    Past Surgical History:  Procedure Laterality Date  . CARDIAC SURGERY     at 34 years old  . COLONOSCOPY W/ BIOPSIES  08/2012   active ileitis, normal colon (bxs of both) Dr. Oletta Lamas  . TUBAL LIGATION Bilateral 02/15/2014   Procedure: POST PARTUM TUBAL LIGATION;  Surgeon: Farrel Gobble. Harrington Challenger, MD;  Location: [facility];  Service: Gynecology;  Laterality: Bilateral;     OB History    Gravida  4     Para  4   Term  2   Preterm  2   AB      Living  4     SAB      TAB      Ectopic      Multiple      Live Births  1           Family History  Problem Relation Age of Onset  . Hypertension Mother   . Heart Problems Mother        tumor, open heart surgery  . Miscarriages / [place] Mother   . Diabetes Father   . Alcohol abuse Father   . Heart Problems Maternal Aunt        tumor  . Diabetes Paternal Grandmother   . Diabetes Maternal Grandmother   . Colon cancer Neg Hx   . Colon polyps Neg Hx   . Kidney disease Neg Hx   . Esophageal cancer Neg Hx     Social History   Tobacco Use  . Smoking status: Never Smoker  . Smokeless tobacco: Never Used  Substance Use Topics  . Alcohol use: Yes    Alcohol/week: 0.0 standard drinks    Comment: occasionally.  . Drug use: Yes    Frequency: 1.0 times per week    Types: Marijuana    Comment: states she smoked 2 weeks ago when she was overly stressed, none since then.LAST USED- 2 WEEKS  AGO    Home Medications Prior to Admission medications   Medication Sig Start Date End Date Taking? Authorizing Provider  famotidine (PEPCID) 40 MG tablet Take 1 tablet (40 mg total) by mouth every evening. 04/21/20 05/21/20  Antonieta Pert, MD  Vitamin D, Ergocalciferol, (DRISDOL) 1.25 MG (50000 UNIT) CAPS capsule Take 50,000 Units by mouth once a week. 04/19/20   [provider]    Allergies    Patient has no known allergies.  Review of Systems   Review of Systems  All other systems reviewed and are negative.   Physical Exam Updated Vital Signs BP 105/82   Pulse 80   Temp 98.9 F (37.2 C) (Oral)   Resp 16   Ht 1.626 m (5\' 4" )   SpO2 100%   BMI 22.31 kg/m   Physical Exam Vitals and nursing note reviewed.  Constitutional:      General: She is not in acute distress.    Appearance: Normal appearance. She is well-developed. She is not toxic-appearing.  HENT:     Head: Normocephalic and atraumatic.      Mouth/Throat:   Eyes:     General: Lids are normal.     Conjunctiva/sclera: Conjunctivae normal.     Pupils: Pupils are equal, round, and reactive to light.  Neck:     Thyroid: No thyroid mass.     Trachea: No tracheal deviation.  Cardiovascular:     Rate and Rhythm: Normal rate and regular rhythm.     Heart sounds: Normal heart sounds. No murmur heard.  No gallop.   Pulmonary:     Effort: Pulmonary effort is normal. No respiratory distress.     Breath sounds: Normal breath sounds. No stridor. No decreased breath sounds, wheezing, rhonchi or rales.  Abdominal:     General: Bowel sounds are normal. There is no distension.     Palpations: Abdomen is soft.     Tenderness: There is no abdominal tenderness. There is no rebound.  Musculoskeletal:        General: No tenderness. Normal range of motion.     Cervical back: Normal range of motion and neck supple.  Skin:    General: Skin is warm and dry.     Findings: No abrasion or rash.  Neurological:     Mental Status: She is alert and oriented to person, place, and time.     GCS: GCS eye subscore is 4. GCS verbal subscore is 5. GCS motor subscore is 6.     Cranial Nerves: No cranial nerve deficit.     Sensory: No sensory deficit.  Psychiatric:        Speech: Speech normal.        Behavior: Behavior normal.     ED Results / Procedures / Treatments   Labs (all labs ordered are listed, but only abnormal results are displayed) Labs Reviewed - No data to display  EKG None  Radiology No results found.  Procedures Procedures (including critical care time)  Medications Ordered in ED Medications  oxyCODONE-acetaminophen (PERCOCET/ROXICET) 5-325 MG per tablet 2 tablet (has no administration in time range)    ED Course  I have reviewed the triage vital signs and the nursing notes.  Pertinent labs & imaging results that were available during my care of the patient were reviewed by me and considered in my medical decision making  (see chart for details).    MDM Rules/Calculators/A&P  Patient given 2 Percocet here.  Given short prescription for analgesics and instructed to follow-up with her doctor Final Clinical Impression(s) / ED Diagnoses Final diagnoses:  None    Rx / DC Orders ED Discharge Orders    None       Lacretia Leigh, MD 06/02/20 [phone]

## 2020-06-02 NOTE — ED Notes (Signed)
Patient verbalizes understanding of discharge instructions. Opportunity for questioning and answers were provided. Armband removed by staff, pt discharged from ED and ambulated to lobby to return home with family.  

## 2020-06-02 NOTE — ED Triage Notes (Signed)
Pt arrives via [facility] with c/o R sided dental pain, reports she broke tooth off about 2 months ago, taking ibuprofen without relief.

## 2020-10-05 ENCOUNTER — Emergency Department (HOSPITAL_COMMUNITY)
Admission: EM | Admit: 2020-10-05 | Discharge: 2020-10-05 | Disposition: A | Discharge status: Home / Self Care | Payer: Medicaid Other | Attending: Emergency Medicine | Admitting: Emergency Medicine

## 2020-10-05 ENCOUNTER — Emergency Department (HOSPITAL_COMMUNITY): Payer: Medicaid Other

## 2020-10-05 ENCOUNTER — Other Ambulatory Visit: Payer: Self-pay

## 2020-10-05 DIAGNOSIS — N939 Abnormal uterine and vaginal bleeding, unspecified: Secondary | ICD-10-CM

## 2020-10-05 DIAGNOSIS — N938 Other specified abnormal uterine and vaginal bleeding: Secondary | ICD-10-CM | POA: Diagnosis not present

## 2020-10-05 LAB — CBC WITH DIFFERENTIAL/PLATELET
Abs Immature Granulocytes: 0.02 10*3/uL (ref 0.00–0.07)
Basophils Absolute: 0 10*3/uL (ref 0.0–0.1)
Basophils Relative: 1 %
Eosinophils Absolute: 0.4 10*3/uL (ref 0.0–0.5)
Eosinophils Relative: 6 %
HCT: 34.3 % — ABNORMAL LOW (ref 36.0–46.0)
Hemoglobin: 10.7 g/dL — ABNORMAL LOW (ref 12.0–15.0)
Immature Granulocytes: 0 %
Lymphocytes Relative: 35 %
Lymphs Abs: 2.4 10*3/uL (ref 0.7–4.0)
MCH: 26.9 pg (ref 26.0–34.0)
MCHC: 31.2 g/dL (ref 30.0–36.0)
MCV: 86.2 fL (ref 80.0–100.0)
Monocytes Absolute: 0.6 10*3/uL (ref 0.1–1.0)
Monocytes Relative: 9 %
Neutro Abs: 3.5 10*3/uL (ref 1.7–7.7)
Neutrophils Relative %: 49 %
Platelets: 343 10*3/uL (ref 150–400)
RBC: 3.98 MIL/uL (ref 3.87–5.11)
RDW: 15 % (ref 11.5–15.5)
WBC: 7 10*3/uL (ref 4.0–10.5)
nRBC: 0 % (ref 0.0–0.2)

## 2020-10-05 LAB — BASIC METABOLIC PANEL
Anion gap: 6 (ref 5–15)
BUN: 9 mg/dL (ref 6–20)
CO2: 24 mmol/L (ref 22–32)
Calcium: 9 mg/dL (ref 8.9–10.3)
Chloride: 108 mmol/L (ref 98–111)
Creatinine, Ser: 0.78 mg/dL (ref 0.44–1.00)
GFR, Estimated: >60 mL/min (ref >60)
Glucose, Bld: 97 mg/dL (ref 70–99)
Potassium: 3.7 mmol/L (ref 3.5–5.1)
Sodium: 138 mmol/L (ref 135–145)

## 2020-10-05 LAB — POC URINE PREG, ED: Preg Test, Ur: NEGATIVE

## 2020-10-05 LAB — HCG, QUANTITATIVE, PREGNANCY: hCG, Beta Chain, Quant, S: 1 m[IU]/mL (ref <5)

## 2020-10-05 NOTE — ED Triage Notes (Signed)
Patient reported period was 4 days late, and she passed a sac like discharge concerning spontaneous miscarriage. POC urine tested negative.

## 2020-10-05 NOTE — Discharge Instructions (Addendum)
You were seen in the emergency department for abnormal vaginal bleeding.  Your pregnancy test was negative.  There is no evidence of a miscarriage.  Your red blood cell count was mildly low and this will need to be followed by your primary care doctor.  Return to the emergency department for any worsening or concerning symptoms

## 2020-10-05 NOTE — ED Provider Notes (Signed)
[facility] EMERGENCY DEPARTMENT Provider Note   CSN: [id number] Arrival date & time: 10/05/20  1424     History Chief Complaint  Patient presents with  . Vaginal Bleeding    Melanie [name] is a 35 y.o. female.  She has a history of anemia and colitis.  She said her period was 4 days late, started a few days ago.  Today she passed saclike tissue.  Concern for miscarriage.  Has some right lower pelvic pain radiating into back.  Vaginal discharge is stopped.  No urinary symptoms.  No chest pain shortness of breath dizziness lightheadedness.  The history is provided by the patient.  Vaginal Bleeding Quality:  Dark red and passed tissue Severity:  Moderate Onset quality:  Gradual Progression:  Resolved Chronicity:  New Menstrual history:  Regular Context: spontaneously   Relieved by:  None tried Worsened by:  Nothing Ineffective treatments:  None tried Associated symptoms: abdominal pain and back pain   Associated symptoms: no dizziness, no dysuria, no fever, no nausea and no vaginal discharge        Past Medical History:  Diagnosis Date  . Anemia   . Cardiac tumor   . Crohn's disease ([place])   . Gestational diabetes   . Gestational diabetes mellitus in pregnancy   . History of genital warts     Patient Active Problem List   Diagnosis Date Noted  . Intractable nausea and vomiting 04/20/2020  . Crohn's disease ([place]) 05/07/2014  . Colitis 05/07/2014  . Pregnant 02/14/2014  . Supervision of other normal pregnancy 02/14/2014  . Gestational diabetes mellitus, class A2 01/30/2014  . 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 Antepartum bleeding, second trimester 10/15/2013  . Acute pericarditis, unspecified 09/12/2013  . 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 Nausea vomiting and diarrhea 06/15/2012  . Dehydration 06/15/2012  . Acute colitis 06/15/2012  . Hypokalemia 06/15/2012  . Preterm premature rupture of  membranes 04/16/2011  . Gestational diabetes 04/16/2011  . SVD (spontaneous vaginal delivery) 04/16/2011    Past Surgical History:  Procedure Laterality Date  . CARDIAC SURGERY     at 35 years old  . COLONOSCOPY W/ BIOPSIES  08/2012   active ileitis, normal colon (bxs of both) Dr. Oletta Lamas  . TUBAL LIGATION Bilateral 02/15/2014   Procedure: POST PARTUM TUBAL LIGATION;  Surgeon: Farrel Gobble. Harrington Challenger, MD;  Location: [facility];  Service: Gynecology;  Laterality: Bilateral;     OB History    Gravida  4   Para  4   Term  2   Preterm  2   AB      Living  4     SAB      IAB      Ectopic      Multiple      Live Births  1           Family History  Problem Relation Age of Onset  . Hypertension Mother   . Heart Problems Mother        tumor, open heart surgery  . Miscarriages / [place] Mother   . Diabetes Father   . Alcohol abuse Father   . Heart Problems Maternal Aunt        tumor  . Diabetes Paternal Grandmother   . Diabetes Maternal Grandmother   . Colon cancer Neg Hx   . Colon polyps Neg Hx   . Kidney disease Neg Hx   . Esophageal cancer Neg  Hx     Social History   Tobacco Use  . Smoking status: Never Smoker  . Smokeless tobacco: Never Used  Substance Use Topics  . Alcohol use: Yes    Alcohol/week: 0.0 standard drinks    Comment: occasionally.  . Drug use: Yes    Frequency: 1.0 times per week    Types: Marijuana    Comment: states she smoked 2 weeks ago when she was overly stressed, none since then.LAST USED- 2 WEEKS AGO    Home Medications Prior to Admission medications   Medication Sig Start Date End Date Taking? Authorizing Provider  famotidine (PEPCID) 40 MG tablet Take 1 tablet (40 mg total) by mouth every evening. 04/21/20 05/21/20  Antonieta Pert, MD  HYDROcodone-acetaminophen (NORCO/VICODIN) 5-325 MG tablet Take 2 tablets by mouth every 4 (four) hours as needed. 06/02/20   Lacretia Leigh, MD  Vitamin D, Ergocalciferol, (DRISDOL) 1.25 MG (50000  UNIT) CAPS capsule Take 50,000 Units by mouth once a week. 04/19/20   [provider]    Allergies    Patient has no known allergies.  Review of Systems   Review of Systems  Constitutional: Negative for fever.  HENT: Negative for sore throat.   Eyes: Negative for visual disturbance.  Respiratory: Negative for shortness of breath.   Cardiovascular: Negative for chest pain.  Gastrointestinal: Positive for abdominal pain. Negative for nausea.  Genitourinary: Positive for vaginal bleeding. Negative for dysuria and vaginal discharge.  Musculoskeletal: Positive for back pain.  Skin: Negative for rash.  Neurological: Negative for dizziness.    Physical Exam Updated Vital Signs BP 111/71 (BP Location: Right Arm)   Pulse 63   Temp 99 F (37.2 C) (Oral)   Resp 19   SpO2 100%   Physical Exam Vitals and nursing note reviewed.  Constitutional:      General: She is not in acute distress.    Appearance: Normal appearance. She is well-developed.  HENT:     Head: Normocephalic and atraumatic.  Eyes:     Conjunctiva/sclera: Conjunctivae normal.  Cardiovascular:     Rate and Rhythm: Normal rate and regular rhythm.     Heart sounds: No murmur heard.   Pulmonary:     Effort: Pulmonary effort is normal. No respiratory distress.     Breath sounds: Normal breath sounds.  Abdominal:     Palpations: Abdomen is soft.     Tenderness: There is no abdominal tenderness. There is no guarding or rebound.  Musculoskeletal:        General: No deformity or signs of injury. Normal range of motion.     Cervical back: Neck supple.  Skin:    General: Skin is warm and dry.  Neurological:     General: No focal deficit present.     Mental Status: She is alert.     ED Results / Procedures / Treatments   Labs (all labs ordered are listed, but only abnormal results are displayed) Labs Reviewed  CBC WITH DIFFERENTIAL/PLATELET - Abnormal; Notable for the following components:      Result  Value   Hemoglobin 10.7 (*)    HCT 34.3 (*)    All other components within normal limits  BASIC METABOLIC PANEL  HCG, QUANTITATIVE, PREGNANCY  POC URINE PREG, ED    EKG None  Radiology US Transvaginal Non-OB  Result Date: 10/05/2020 CLINICAL DATA:  36 year old female with acute RIGHT pelvic pain. EXAM: TRANSABDOMINAL AND TRANSVAGINAL ULTRASOUND OF PELVIS DOPPLER ULTRASOUND OF OVARIES TECHNIQUE: Both transabdominal and transvaginal  ultrasound examinations of the pelvis were performed. Transabdominal technique was performed for global imaging of the pelvis including uterus, ovaries, adnexal regions, and pelvic cul-de-sac. It was necessary to proceed with endovaginal exam following the transabdominal exam to visualize the ovaries and endometrium. Color and duplex Doppler ultrasound was utilized to evaluate blood flow to the ovaries. COMPARISON:  07/26/2018 ultrasound and 04/20/2020 CT FINDINGS: Uterus Measurements: 10.2 x 4.3 x 6.4 cm = volume: 147 mL. Uterine fibroids are noted as follows: A 3.2 x 2.9 x 2.8 cm intramural RIGHT uterine body fibroid. A 3.4 x 4.4 x 3.3 cm posterior intramural/subserosal uterine body fibroid. A 1.9 x 2 x 1.8 cm intramural anterior fundal fibroid. Endometrium Thickness: 5 mm.  No focal abnormality visualized. Right ovary Measurements: 3.3 x 2.3 x 1.7 cm = volume: 6.8 mL. Normal appearance/no adnexal mass. No evidence of torsion. Left ovary Measurements: 3.2 x 2 x 2.2 cm = volume: 7.4 mL. Normal appearance/no adnexal mass. No evidence of torsion. Pulsed Doppler evaluation of both ovaries demonstrates normal low-resistance arterial and venous waveforms. Other findings Trace free fluid noted which may be physiologic. IMPRESSION: 1. Normal ovaries - no evidence of ovarian torsion. 2. Uterine fibroids as described. 3. Trace free pelvic fluid which may be physiologic. Electronically Signed   By: Margarette Canada M.D.   On: 10/05/2020 16:27   US Pelvis Complete  Result Date:  10/05/2020 CLINICAL DATA:  35 year old female with acute RIGHT pelvic pain. EXAM: TRANSABDOMINAL AND TRANSVAGINAL ULTRASOUND OF PELVIS DOPPLER ULTRASOUND OF OVARIES TECHNIQUE: Both transabdominal and transvaginal ultrasound examinations of the pelvis were performed. Transabdominal technique was performed for global imaging of the pelvis including uterus, ovaries, adnexal regions, and pelvic cul-de-sac. It was necessary to proceed with endovaginal exam following the transabdominal exam to visualize the ovaries and endometrium. Color and duplex Doppler ultrasound was utilized to evaluate blood flow to the ovaries. COMPARISON:  07/26/2018 ultrasound and 04/20/2020 CT FINDINGS: Uterus Measurements: 10.2 x 4.3 x 6.4 cm = volume: 147 mL. Uterine fibroids are noted as follows: A 3.2 x 2.9 x 2.8 cm intramural RIGHT uterine body fibroid. A 3.4 x 4.4 x 3.3 cm posterior intramural/subserosal uterine body fibroid. A 1.9 x 2 x 1.8 cm intramural anterior fundal fibroid. Endometrium Thickness: 5 mm.  No focal abnormality visualized. Right ovary Measurements: 3.3 x 2.3 x 1.7 cm = volume: 6.8 mL. Normal appearance/no adnexal mass. No evidence of torsion. Left ovary Measurements: 3.2 x 2 x 2.2 cm = volume: 7.4 mL. Normal appearance/no adnexal mass. No evidence of torsion. Pulsed Doppler evaluation of both ovaries demonstrates normal low-resistance arterial and venous waveforms. Other findings Trace free fluid noted which may be physiologic. IMPRESSION: 1. Normal ovaries - no evidence of ovarian torsion. 2. Uterine fibroids as described. 3. Trace free pelvic fluid which may be physiologic. Electronically Signed   By: Margarette Canada M.D.   On: 10/05/2020 16:27   [place] Art/Ven Flow Abd Pelv Doppler  Result Date: 10/05/2020 CLINICAL DATA:  35 year old female with acute RIGHT pelvic pain. EXAM: TRANSABDOMINAL AND TRANSVAGINAL ULTRASOUND OF PELVIS DOPPLER ULTRASOUND OF OVARIES TECHNIQUE: Both transabdominal and transvaginal ultrasound  examinations of the pelvis were performed. Transabdominal technique was performed for global imaging of the pelvis including uterus, ovaries, adnexal regions, and pelvic cul-de-sac. It was necessary to proceed with endovaginal exam following the transabdominal exam to visualize the ovaries and endometrium. Color and duplex Doppler ultrasound was utilized to evaluate blood flow to the ovaries. COMPARISON:  07/26/2018 ultrasound and 04/20/2020 CT FINDINGS:  Uterus Measurements: 10.2 x 4.3 x 6.4 cm = volume: 147 mL. Uterine fibroids are noted as follows: A 3.2 x 2.9 x 2.8 cm intramural RIGHT uterine body fibroid. A 3.4 x 4.4 x 3.3 cm posterior intramural/subserosal uterine body fibroid. A 1.9 x 2 x 1.8 cm intramural anterior fundal fibroid. Endometrium Thickness: 5 mm.  No focal abnormality visualized. Right ovary Measurements: 3.3 x 2.3 x 1.7 cm = volume: 6.8 mL. Normal appearance/no adnexal mass. No evidence of torsion. Left ovary Measurements: 3.2 x 2 x 2.2 cm = volume: 7.4 mL. Normal appearance/no adnexal mass. No evidence of torsion. Pulsed Doppler evaluation of both ovaries demonstrates normal low-resistance arterial and venous waveforms. Other findings Trace free fluid noted which may be physiologic. IMPRESSION: 1. Normal ovaries - no evidence of ovarian torsion. 2. Uterine fibroids as described. 3. Trace free pelvic fluid which may be physiologic. Electronically Signed   By: Margarette Canada M.D.   On: 10/05/2020 16:27    Procedures Procedures   Medications Ordered in ED Medications - No data to display  ED Course  I have reviewed the triage vital signs and the nursing notes.  Pertinent labs & imaging results that were available during my care of the patient were reviewed by me and considered in my medical decision making (see chart for details).    MDM Rules/Calculators/A&P                         This patient complains of vaginal bleeding, possibly passing tissue, right adnexal pain radiating to  back; this involves an extensive number of treatment Options and is a complaint that carries with it a high risk of complications and Morbidity. The differential includes miscarriage, dysfunctional bleeding, ovarian torsion  I ordered, reviewed and interpreted labs, which included CBC with normal white count, hemoglobin lower than baseline, chemistry normal, beta-hCG essentially undetectable I ordered imaging studies which included pelvic ultrasound and I independently    visualized and interpreted imaging which showed no acute findings Previous records obtained and reviewed in epic, no recent admissions  After the interventions stated above, I reevaluated the patient and found patient to be hemodynamically stable and in no distress.  Reviewed results with her and she is comfortable plan for outpatient follow-up with her gynecologist.  Return instructions discussed   Final Clinical Impression(s) / ED Diagnoses Final diagnoses:  Abnormal uterine bleeding (AUB)    Rx / DC Orders ED Discharge Orders    None       Hayden Rasmussen, MD 10/06/20 1003

## 2020-10-05 NOTE — ED Notes (Signed)
Patient transported to US 

## 2021-01-01 ENCOUNTER — Ambulatory Visit: Payer: Medicaid Other

## 2021-01-27 ENCOUNTER — Ambulatory Visit: Payer: Medicaid Other | Admitting: Nurse Practitioner

## 2021-03-10 ENCOUNTER — Ambulatory Visit: Payer: Medicaid Other | Admitting: Obstetrics and Gynecology

## 2021-04-30 ENCOUNTER — Other Ambulatory Visit: Payer: Self-pay | Admitting: Obstetrics and Gynecology

## 2021-04-30 ENCOUNTER — Other Ambulatory Visit (HOSPITAL_COMMUNITY): Payer: Self-pay | Admitting: Obstetrics and Gynecology

## 2021-04-30 DIAGNOSIS — R221 Localized swelling, mass and lump, neck: Secondary | ICD-10-CM

## 2021-05-11 ENCOUNTER — Ambulatory Visit (HOSPITAL_COMMUNITY): Admission: RE | Admit: 2021-05-11 | Payer: Medicaid Other | Source: Ambulatory Visit

## 2021-05-11 ENCOUNTER — Encounter (HOSPITAL_COMMUNITY): Payer: Self-pay

## 2021-05-26 ENCOUNTER — Ambulatory Visit (HOSPITAL_COMMUNITY)
Admission: RE | Admit: 2021-05-26 | Discharge: 2021-05-26 | Disposition: A | Discharge status: Home / Self Care | Payer: Medicaid Other | Source: Ambulatory Visit | Attending: Obstetrics and Gynecology | Admitting: Obstetrics and Gynecology

## 2021-05-26 ENCOUNTER — Other Ambulatory Visit (HOSPITAL_COMMUNITY): Payer: Self-pay | Admitting: Obstetrics and Gynecology

## 2021-05-26 DIAGNOSIS — R221 Localized swelling, mass and lump, neck: Secondary | ICD-10-CM | POA: Diagnosis present

## 2021-06-25 ENCOUNTER — Encounter (HOSPITAL_COMMUNITY): Payer: Self-pay | Admitting: Radiology

## 2021-07-12 DIAGNOSIS — C801 Malignant (primary) neoplasm, unspecified: Secondary | ICD-10-CM

## 2021-07-12 HISTORY — DX: Malignant (primary) neoplasm, unspecified: C80.1

## 2021-08-04 NOTE — H&P (Signed)
HPI:   Melanie [name] is a 36 y.o. female who presents as a consult Patient.   Referring Provider: Tanna Savoy,*  Chief complaint: Thyroid mass.  HPI: Found to have a left-sided thyroid mass a couple of months ago. Recent ultrasound reveals a dominant nodule that is suspicious and meets criteria for tissue sampling. Otherwise in good health. She is getting ready to see a dentist about a dental problem in February.  PMH/Meds/All/SocHx/FamHx/ROS:   Past Medical History:  Diagnosis Date   Diabetes mellitus ([place])   Past Surgical History:  Procedure Laterality Date   NO PAST SURGERIES   No family history of bleeding disorders, wound healing problems or difficulty with anesthesia.   Social History   Socioeconomic History   Marital status: Single  Spouse name: Not on file   Number of children: Not on file   Years of education: Not on file   Highest education level: Not on file  Occupational History   Not on file  Tobacco Use   Smoking status: Some Days  Types: Cigarettes   Smokeless tobacco: Never  Vaping Use   Vaping Use: Never used  Substance and Sexual Activity   Alcohol use: Not on file   Drug use: Not on file   Sexual activity: Not on file  Other Topics Concern   Not on file  Social History Narrative   Not on file   Social Determinants of Health   Financial Resource Strain: Not on file  Food Insecurity: Not on file  Transportation Needs: Not on file  Physical Activity: Not on file  Stress: Not on file  Social Connections: Not on file  Housing Stability: Not on file   Current Outpatient Medications:   ACCU-CHEK FASTCLIX LANCET DRUM Misc, USE EVERY DAY FOR GLUCOSE TESTING, Disp: , Rfl:   ACCU-CHEK FASTCLIX LANCING DEV Kit, USE AS DIRECTED, Disp: , Rfl:   ACCU-CHEK GUIDE TEST STRIPS Strp test strips, , Disp: , Rfl:   ergocalciferol (VITAMIN D2) 1,250 mcg (50,000 unit) capsule, Take 1 capsule by mouth once a week., Disp: , Rfl:   hydrOXYzine HCL  (ATARAX) 25 MG tablet, , Disp: , Rfl:   omeprazole (PRILOSEC) 20 MG capsule, Take 20 mg by mouth 2 times daily., Disp: , Rfl:   A complete ROS was performed with pertinent positives/negatives noted in the HPI. The remainder of the ROS are negative.   Physical Exam:   BP 138/80   Pulse 75   Temp 97.7 F (36.5 C)   Ht 1.626 m (5' 4" )   Wt 65.3 kg (144 lb)   BMI 24.72 kg/m   General: Healthy and alert, in no distress, breathing easily. Normal affect. In a pleasant mood. Head: Normocephalic, atraumatic. No masses, or scars. Eyes: Pupils are equal, and reactive to light. Vision is grossly intact. No spontaneous or gaze nystagmus. Ears: Ear canals are clear. Tympanic membranes are intact, with normal landmarks and the middle ears are clear and healthy. Hearing: Grossly normal. Nose: Nasal cavities are clear with healthy mucosa, no polyps or exudate. Airways are patent. Face: No masses or scars, facial nerve function is symmetric. Oral Cavity: No mucosal abnormalities are noted. Tongue with normal mobility. Dentition appears healthy. Oropharynx: Tonsils are symmetric. There are no mucosal masses identified. Tongue base appears normal and healthy. Larynx/Hypopharynx: indirect exam reveals healthy, mobile vocal cords, without mucosal lesions in the hypopharynx or larynx. Chest: Deferred Neck: Large mobile relatively soft thyroid mass on the left, approximately 4 to 5 cm. There  is a submental node also palpable at the midline that is nontender. According to the patient its been there for a long time.. Neuro: Cranial nerves II-XII with normal function. Balance: Normal gate. Other findings: none.  Independent Review of Additional Tests or Records:  Ultrasound reviewed.  Procedures:  Procedure Note:  Indications for procedure: thyroid mass  Details of the procedure were discussed with the patient and all questions were answered.  Procedure:  2% xylocaine with epinephrine was infiltrated  into the overlying skin. First pass was made with a 25 gauge needle and 10 cc syringe. Second pass was made with a 22 gauge needle. Specimen was placed on microscopic slides and air-dried. Additional material was placed in Cytolyte solution for cell block preparation. A third sample was obtained with a 22 guage needle and sent for reflex Afirma testing.  A bandage was applied.   She tolerated the procedure well. Results will be discussed when available.  Impression & Plans:  Dominant thyroid nodule on the left. FNA performed. We will discuss results when available.

## 2021-08-04 NOTE — Progress Notes (Signed)
Spoke with Devita at Dr. Janeice Robinson office for needed orders for patient's PAT appt on 08/05/21 at 8am

## 2021-08-04 NOTE — Progress Notes (Addendum)
Surgical Instructions    Your procedure is scheduled on Friday January 27th.  Report to [facility] Main Entrance "A" at 6:30 A.M., then check in with the Admitting office.  Call this number if you have problems the morning of surgery:  [phone]   If you have any questions prior to your surgery date call [phone]: Open Monday-Friday 8am-4pm    Remember:  Do not eat or drink after midnight the night before your surgery     Take these medicines the morning of surgery with A SIP OF WATER amoxicillin-clavulanate (AUGMENTIN) 500-125 MG tablet omeprazole (PRILOSEC) 20 MG capsule   As of today, STOP taking any Aspirin (unless otherwise instructed by your surgeon) Aleve, Naproxen, Ibuprofen, Motrin, Advil, Goody's, BC's, all herbal medications, fish oil, and all vitamins.  After your COVID test   You are not required to quarantine however you are required to wear a well-fitting mask when you are out and around people not in your household.  If your mask becomes wet or soiled, replace with a new one.  Wash your hands often with soap and water for 20 seconds or clean your hands with an alcohol-based hand sanitizer that contains at least 60% alcohol.  Do not share personal items.  Notify your provider: if you are in close contact with someone who has COVID  or if you develop a fever of 100.4 or greater, sneezing, cough, sore throat, shortness of breath or body aches.           Do not wear jewelry or makeup Do not wear lotions, powders, perfumes, or deodorant. Do not shave 48 hours prior to surgery.   Do not bring valuables to the hospital. DO Not wear nail polish, gel polish, artificial nails, or any other type of covering on natural nails (fingers and toes) If you have artificial nails or gel coating that need to be removed by a nail salon, please have this removed prior to surgery. Artificial nails or gel coating may interfere with anesthesia's ability to adequately monitor your  vital signs.             [facility] is not responsible for any belongings or valuables.  Do NOT Smoke (Tobacco/Vaping)  24 hours prior to your procedure  If you use a CPAP at night, you may bring your mask for your overnight stay.   Contacts, glasses, hearing aids, dentures or partials may not be worn into surgery, please bring cases for these belongings   For patients admitted to the hospital, discharge time will be determined by your treatment team.   Patients discharged the day of surgery will not be allowed to drive home, and someone needs to stay with them for 24 hours.  NO VISITORS WILL BE ALLOWED IN PRE-OP WHERE PATIENTS ARE PREPPED FOR SURGERY.  ONLY 1 SUPPORT PERSON MAY BE PRESENT IN THE WAITING ROOM WHILE YOU ARE IN SURGERY.  IF YOU ARE TO BE ADMITTED, ONCE YOU ARE IN YOUR ROOM YOU WILL BE ALLOWED TWO (2) VISITORS. 1 (ONE) VISITOR MAY STAY OVERNIGHT BUT MUST ARRIVE TO THE ROOM BY 8pm.  Minor children may have two parents present. Special consideration for safety and communication needs will be reviewed on a case by case basis.  Special instructions:    Oral Hygiene is also important to reduce your risk of infection.  Remember - BRUSH YOUR TEETH THE MORNING OF SURGERY WITH YOUR REGULAR TOOTHPASTE   [facility]- Preparing For Surgery  Before surgery, you can play  an important role. Because skin is not sterile, your skin needs to be as free of germs as possible. You can reduce the number of germs on your skin by washing with CHG (chlorahexidine gluconate) Soap before surgery.  CHG is an antiseptic cleaner which kills germs and bonds with the skin to continue killing germs even after washing.     Please do not use if you have an allergy to CHG or antibacterial soaps. If your skin becomes reddened/irritated stop using the CHG.  Do not shave (including legs and underarms) for at least 48 hours prior to first CHG shower. It is OK to shave your face.  Please follow these instructions  carefully.     Shower the NIGHT BEFORE SURGERY and the MORNING OF SURGERY with CHG Soap.   If you chose to wash your hair, wash your hair first as usual with your normal shampoo. After you shampoo, rinse your hair and body thoroughly to remove the shampoo.  Then [employer] and genitals (private parts) with your normal soap and rinse thoroughly to remove soap.  After that Use CHG Soap as you would any other liquid soap. You can apply CHG directly to the skin and wash gently with a scrungie or a clean washcloth.   Apply the CHG Soap to your body ONLY FROM THE NECK DOWN.  Do not use on open wounds or open sores. Avoid contact with your eyes, ears, mouth and genitals (private parts). Wash Face and genitals (private parts)  with your normal soap.   Wash thoroughly, paying special attention to the area where your surgery will be performed.  Thoroughly rinse your body with warm water from the neck down.  DO NOT shower/wash with your normal soap after using and rinsing off the CHG Soap.  Pat yourself dry with a CLEAN TOWEL.  Wear CLEAN PAJAMAS to bed the night before surgery  Place CLEAN SHEETS on your bed the night before your surgery  DO NOT SLEEP WITH PETS.   Day of Surgery:  Take a shower with CHG soap. Wear Clean/Comfortable clothing the morning of surgery Do not apply any deodorants/lotions.   Remember to brush your teeth WITH YOUR REGULAR TOOTHPASTE.   Please read over the following fact sheets that you were given.

## 2021-08-05 ENCOUNTER — Encounter (HOSPITAL_COMMUNITY): Payer: Self-pay

## 2021-08-05 ENCOUNTER — Other Ambulatory Visit: Payer: Self-pay

## 2021-08-05 ENCOUNTER — Encounter (HOSPITAL_COMMUNITY)
Admission: RE | Admit: 2021-08-05 | Discharge: 2021-08-05 | Disposition: A | Discharge status: Home / Self Care | Payer: Medicaid Other | Source: Ambulatory Visit | Attending: Otolaryngology | Admitting: Otolaryngology

## 2021-08-05 VITALS — BP 124/79 | HR 75 | Temp 99.0°F | Resp 18 | Ht 64.0 in | Wt 142.8 lb

## 2021-08-05 DIAGNOSIS — Z20822 Contact with and (suspected) exposure to covid-19: Secondary | ICD-10-CM | POA: Diagnosis not present

## 2021-08-05 DIAGNOSIS — E876 Hypokalemia: Secondary | ICD-10-CM | POA: Insufficient documentation

## 2021-08-05 DIAGNOSIS — Z01818 Encounter for other preprocedural examination: Secondary | ICD-10-CM | POA: Diagnosis present

## 2021-08-05 DIAGNOSIS — O24419 Gestational diabetes mellitus in pregnancy, unspecified control: Secondary | ICD-10-CM | POA: Insufficient documentation

## 2021-08-05 DIAGNOSIS — I071 Rheumatic tricuspid insufficiency: Secondary | ICD-10-CM | POA: Diagnosis not present

## 2021-08-05 HISTORY — DX: Anxiety disorder, unspecified: F41.9

## 2021-08-05 HISTORY — DX: Gastro-esophageal reflux disease without esophagitis: K21.9

## 2021-08-05 HISTORY — DX: Benign neoplasm of connective and other soft tissue, unspecified: D21.9

## 2021-08-05 HISTORY — DX: Attention-deficit hyperactivity disorder, unspecified type: F90.9

## 2021-08-05 LAB — CBC
HCT: 36 % (ref 36.0–46.0)
Hemoglobin: 11 g/dL — ABNORMAL LOW (ref 12.0–15.0)
MCH: 24.8 pg — ABNORMAL LOW (ref 26.0–34.0)
MCHC: 30.6 g/dL (ref 30.0–36.0)
MCV: 81.3 fL (ref 80.0–100.0)
Platelets: 380 10*3/uL (ref 150–400)
RBC: 4.43 MIL/uL (ref 3.87–5.11)
RDW: 16.2 % — ABNORMAL HIGH (ref 11.5–15.5)
WBC: 6.1 10*3/uL (ref 4.0–10.5)
nRBC: 0 % (ref 0.0–0.2)

## 2021-08-05 LAB — BASIC METABOLIC PANEL
Anion gap: 9 (ref 5–15)
BUN: 7 mg/dL (ref 6–20)
CO2: 22 mmol/L (ref 22–32)
Calcium: 9.4 mg/dL (ref 8.9–10.3)
Chloride: 105 mmol/L (ref 98–111)
Creatinine, Ser: 0.76 mg/dL (ref 0.44–1.00)
GFR, Estimated: >60 mL/min (ref >60)
Glucose, Bld: 123 mg/dL — ABNORMAL HIGH (ref 70–99)
Potassium: 3.8 mmol/L (ref 3.5–5.1)
Sodium: 136 mmol/L (ref 135–145)

## 2021-08-05 LAB — HEMOGLOBIN A1C
Hgb A1c MFr Bld: 6.2 % — ABNORMAL HIGH (ref 4.8–5.6)
Mean Plasma Glucose: 131 mg/dL

## 2021-08-05 LAB — SARS CORONAVIRUS 2 (TAT 6-24 HRS): SARS Coronavirus 2: NEGATIVE

## 2021-08-05 LAB — GLUCOSE, CAPILLARY: Glucose-Capillary: 115 mg/dL — ABNORMAL HIGH (ref 70–99)

## 2021-08-05 NOTE — Progress Notes (Addendum)
PCP - Dr. Nolene Ebbs Cardiologist - Per patient -  In 9th grade had open heart surgery, cannot recall cardiologist name. Does not currently see one.  Chest x-ray - Not indicated EKG - 08/05/21 Stress Test - Denies ECHO - 10/01/13 Cardiac Cath - Denies  DM - Type II CBG at PAT appt 115 Fasting Blood Sugar - Average at home 90-95 Checks Blood Sugar ___daily  COVID TEST- 08/05/21   Anesthesia review: Yes cardiac history  Patient denies shortness of breath, fever, cough and chest pain at PAT appointment   All instructions explained to the patient, with a verbal understanding of the material. Patient agrees to go over the instructions while at home for a better understanding. Patient also instructed to wear a mask while in public after being tested for COVID-19. The opportunity to ask questions was provided.

## 2021-08-06 ENCOUNTER — Encounter (HOSPITAL_COMMUNITY): Payer: Self-pay

## 2021-08-06 ENCOUNTER — Encounter (HOSPITAL_COMMUNITY): Payer: Self-pay | Admitting: Physician Assistant

## 2021-08-06 NOTE — Progress Notes (Signed)
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  In 2015 she was reevaluated by cardiology secondary to episode of pericarditis.  Echocardiogram 10/01/2013 showed normal LVEF, possible mass of the lateral wall of left atrium, and moderate to severe tricuspid regurgitation.  Dr. Lilian Kapur commented on the results and stated that the finding in the left atrium could be sequela from prior atrial myxoma removal but did recommend cardiac MRI for further evaluation.  Also commented that in new moderate to severe tricuspid vegetation could be related to patient being sick in trimester of pregnancy but would nonetheless need follow-up.  Given the patient was pregnant at the time, further imaging was postponed.  Unfortunately, it does not appear patient ever did follow-up with cardiology.  I spoke with patient's PCP Dr. Nolene Ebbs regarding patient's cardiac history.  He advised that the patient had not reported her 2015 evaluation or abnormal echocardiogram.  He is unaware of patient having any follow-up cardiac testing since that time.  He agreed the patient would require reevaluation by cardiology prior to undergoing total thyroidectomy.  He stated he would help expedite this process but that her surgery scheduled for 1/27 will need to be postponed.  I called Dr. Janeice Robinson office to advise that patient will require additional cardiology evaluation prior to surgery.   Wynonia Musty [facility]: [facility]/Anesthesiology Phone [phone] 08/06/2021 3:12 PM

## 2021-08-07 ENCOUNTER — Encounter (HOSPITAL_COMMUNITY): Admission: RE | Payer: Self-pay | Source: Home / Self Care

## 2021-08-07 ENCOUNTER — Telehealth: Payer: Self-pay

## 2021-08-07 ENCOUNTER — Telehealth: Payer: Self-pay | Admitting: *Deleted

## 2021-08-07 ENCOUNTER — Inpatient Hospital Stay (HOSPITAL_COMMUNITY): Admission: RE | Admit: 2021-08-07 | Payer: Medicaid Other | Source: Home / Self Care | Admitting: Otolaryngology

## 2021-08-07 SURGERY — THYROIDECTOMY
Anesthesia: General

## 2021-08-07 NOTE — Telephone Encounter (Signed)
Pt has NEW PT appt with Dr. Gasper Sells 08/28/21 for pre op clearance. I will forward notes to MD for upcoming appt. I will send FYI to surgeon's office pt has NEW PT Appt 08/28/21. Once the pt has been cleared, our office will be sure to fax clearance notes and any recommendations.

## 2021-08-07 NOTE — Telephone Encounter (Signed)
° °  Pre-operative Risk Assessment    Patient Name: Melanie [name]  DOB: December 31, 1985 MRN: [id number]     PT HAS BEEN REFERRED TO CARDIOLOGY FOR PRE OP CLEARANCE. PT WILL NEED A NEW PT APPT. REFERRAL HAS BEEN RECEIVED AND NOTES HAVE BEEN SCANNED IN BY OUR CHART PREP TEAM. I WILL SEND A MESSAGE TO OUR SCHEDULING TEAM TO PLEASE REACH OUT TO THE PT WITH A NEW PT APPT FOR PRE OP CLEARANCE.  Request for Surgical Clearance    Procedure:   TOTAL THYROIDECTOMY  Date of Surgery:  Clearance TBD                                 Surgeon:  DR. Izora Gala Surgeon's Group or Practice Name:  [facility] Phone number:  [phone] Fax number:  [phone]   Type of Clearance Requested:   - Medical    Type of Anesthesia:  General    Additional requests/questions:    [name]   08/07/2021, 12:57 PM

## 2021-08-07 NOTE — Telephone Encounter (Signed)
NOTES SCANNED TO REFERRAL 

## 2021-08-26 NOTE — Progress Notes (Signed)
Cardiology Office Note:    Date:  08/28/2021   ID:  Melanie [name], DOB 25-Jul-1985, MRN [id number]  PCP:  Nolene Ebbs, MD   [facility] Providers Cardiologist:  Werner Lean, MD     Referring MD: Nolene Ebbs, MD   CC: follow up atrial mass Consulted for the evaluation of atrial myxoma at the behest of Nolene Ebbs, MD  History of Present Illness:    Melanie [name] is a 36 y.o. female with a hx of history of atrial myxoma removed at age 38.  History of thyroid disease and is s/p thyroidectomy.  Patient notes that she was feeling ok in high school.  Ran track and would have chest pain and SOB.  Found to have atrial myxoma, this was removed at [facility].  No more chest pain, no SOB.  Occasional reflux.  Has had no chest pain, chest pressure, chest tightness, chest stinging .   Patient exertion notable for exercise every day, and feels no symptoms.  No shortness of breath, DOE .  No PND or orthopnea.  No weight gain, leg swelling , or abdominal swelling.  No syncope or near syncope . Notes  no palpitations or funny heart beats.     Past Medical History:  Diagnosis Date   ADHD (attention deficit hyperactivity disorder)    Anemia    Anxiety    Cancer ([place]) 2023   Thyroid cancer   Cardiac tumor    Atrial myxoma, removed at age 62   Crohn's disease ([place])    Fibroids    GERD (gastroesophageal reflux disease)    Gestational diabetes    Gestational diabetes mellitus in pregnancy    History of genital warts     Past Surgical History:  Procedure Laterality Date   CARDIAC SURGERY     at 36 years old   COLONOSCOPY W/ BIOPSIES  08/2012   active ileitis, normal colon (bxs of both) Dr. Oletta Lamas   TUBAL LIGATION Bilateral 02/15/2014   Procedure: POST PARTUM TUBAL LIGATION;  Surgeon: Farrel Gobble. Harrington Challenger, MD;  Location: [facility];  Service: Gynecology;  Laterality: Bilateral;    Current Medications: Current Meds  Medication Sig   amoxicillin-clavulanate (AUGMENTIN) 500-125  MG tablet Take 1 tablet by mouth 3 (three) times daily.   hydrOXYzine (ATARAX) 25 MG tablet Take 25 mg by mouth at bedtime as needed for anxiety.   LORazepam (ATIVAN) 1 MG tablet Take 30 min prior to Cardiac MRI   omeprazole (PRILOSEC) 20 MG capsule Take 20 mg by mouth 2 (two) times daily.   Vitamin D, Ergocalciferol, (DRISDOL) 1.25 MG (50000 UNIT) CAPS capsule Take 50,000 Units by mouth once a week.   vortioxetine HBr (TRINTELLIX) 5 MG TABS tablet Take 5 mg by mouth daily.     Allergies:   Patient has no known allergies.   Social History   Socioeconomic History   Marital status: Significant Other    Spouse name: Not on file   Number of children: 4   Years of education: Not on file   Highest education level: Not on file  Occupational History   Occupation: Stay at home mom  Tobacco Use   Smoking status: Never   Smokeless tobacco: Never  Vaping Use   Vaping Use: Never used  Substance and Sexual Activity   Alcohol use: Yes    Alcohol/week: 0.0 standard drinks    Comment: occasionally.   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 There is no history of Colon cancer, Colon polyps, Kidney disease, or Esophageal cancer.  ROS:   Please see the history of present illness.     All  other systems reviewed and are negative.  EKGs/Labs/Other Studies Reviewed:    The following studies were reviewed today:   EKG:  EKG is  ordered today.  The ekg ordered today demonstrates  08/26/21: SR rate 67   Transthoracic Echocardiogram: Date: 10/01/2013 Results: - Left ventricle: The cavity size was normal. Systolic    function was vigorous. The estimated ejection fraction was    in the range of 65% to 70%. Wall motion was normal; there    were no regional wall motion abnormalities. Left    ventricular diastolic function parameters were normal.  - Left atrium: There is possible mass at the lateral wall of    the left atrium. Further evaluation with cardiac MRI is    recommended. The atrium was mildly dilated.  - Right ventricle: The cavity size was mildly dilated. Wall    thickness was normal. Systolic function was mildly    reduced.  - Atrial septum: A septal defect cannot be excluded.  - Tricuspid valve: Moderate-severe regurgitation.   Recent Labs: 08/05/2021: BUN 7; Creatinine, Ser 0.76; Hemoglobin 11.0; Platelets 380; Potassium 3.8; Sodium 136  Recent Lipid Panel No results found for: CHOL, TRIG, HDL, CHOLHDL, VLDL, LDLCALC, LDLDIRECT       Physical Exam:    VS:  BP 101/67    Pulse 67    Ht 5\' 4"  (1.626 m)    Wt 64 kg    LMP 07/30/2021 (Approximate)    SpO2 97%    BMI 24.20 kg/m     Wt Readings from Last 3 Encounters:  08/28/21 64 kg  08/05/21 64.8 kg  04/20/20 59 kg     Gen: no distress   Neck: No JVD, no carotid bruit Cardiac: No Rubs or Gallops, no Murmur, RRR +radial pulses Respiratory: Clear to auscultation bilaterally, normal effort, normal  respiratory rate GI: Soft, nontender, non-distended  MS: No  edema;  moves all extremities Integument: Skin feels warm Neuro:  At time of evaluation, alert and oriented to person/place/time/situation  Psych: Normal affect, patient feels well   ASSESSMENT:    1. Nonrheumatic tricuspid valve regurgitation    2. History of myxoma   3. Hyperthyroidism    PLAN:    Left atrial mass  Pre-op risk stratification - has a history of a prior left atrial myxoma s/p open heart surgery - there was a question mark of a new left atrial mass in 2015 - will repeat Cardiac MRI can use her prior CBC for study - needs anti-anxiety prior - if normal reasonable for thyroid surgery   Medication Adjustments/Labs and Tests  Ordered: Current medicines are reviewed at length with the patient today.  Concerns regarding medicines are outlined above.  Orders Placed This Encounter  Procedures   MR CARDIAC MORPHOLOGY W WO CONTRAST   EKG 12-Lead   Meds ordered this encounter  Medications   LORazepam (ATIVAN) 1 MG tablet    Sig: Take 30 min prior to Cardiac MRI    Dispense:  1 tablet    Refill:  0    Patient Instructions  Medication Instructions:  Your physician recommends that you continue on your current medications as directed. Please refer to the Current Medication list given to you today.  Take 30 minutes prior to Cardiac MRI: Ativan 1 mg by mouth  *If you need a refill on your cardiac medications before your next appointment, please call your pharmacy*   Lab Work: NONE If you have labs (blood work) drawn today and your tests are completely normal, you will receive your results only by: [place] (if you have MyChart) OR A paper copy in the mail If you have any lab test that is abnormal or we need to change your treatment, we will call you to review the results.   Testing/Procedures: Your physician has requested that you have a Cardiac MRI.    Follow-Up:  As needed  At [facility], you and your health needs are our priority.  As part of our continuing mission to provide you with exceptional heart care, we have created designated Provider Care Teams.  These Care Teams include your primary Cardiologist (physician) and Advanced Practice Providers (APPs -  Physician Assistants and Nurse  Practitioners) who all work together to provide you with the care you need, when you need it.   Provider:   Werner Lean, MD     Other Instructions    You are scheduled for Cardiac MRI on ______________. Please arrive at the [facility] main entrance of [facility] at ________________ (30-45 minutes prior to test start time). ?  [facility] [address] [place], [place] [address] [phone]  Please take advantage of the free valet parking available at the MAIN entrance (A entrance). Proceed to the [facility] Radiology Department (First Floor). ? Magnetic resonance imaging (MRI) is a painless test that produces images of the inside of the body without using Xrays.  During an MRI, strong magnets and radio waves work together in a Research officer, political party to form detailed images.   MRI images may provide more details about a medical condition than X-rays, CT scans, and ultrasounds can provide.  You may be given earphones to listen for instructions.  You may eat a light breakfast and take medications as ordered with the exception of HCTZ (fluid pill, other). Please avoid stimulants for 12 hr prior to test. (Ie. Caffeine, nicotine, chocolate, or antihistamine medications)  If a contrast material will be used, an IV will be inserted into one of your veins. Contrast material will be injected into your IV. It will leave your body through your urine within a day. You may be told to drink plenty of fluids to help flush the contrast material out of your system.  You will be asked to remove all metal, including: Watch, jewelry, and other metal objects including hearing aids, hair pieces and dentures. Also wearable glucose monitoring systems (ie. Freestyle Libre and Omnipods) (Braces and fillings normally are not a problem.)   TEST WILL TAKE APPROXIMATELY 1 HOUR  PLEASE NOTIFY SCHEDULING AT LEAST 24 HOURS IN ADVANCE  IF YOU ARE UNABLE TO KEEP YOUR  APPOINTMENT. [phone]  Please call Marchia Bond, cardiac imaging nurse navigator with any questions/concerns. Marchia Bond RN Navigator Cardiac Imaging Gordy Clement RN Navigator Cardiac Imaging [facility] [phone] Office      Signed, Werner Lean, MD  08/28/2021 8:46 AM    [place]

## 2021-08-28 ENCOUNTER — Ambulatory Visit (INDEPENDENT_AMBULATORY_CARE_PROVIDER_SITE_OTHER): Payer: Medicaid Other | Admitting: Internal Medicine

## 2021-08-28 ENCOUNTER — Encounter: Payer: Self-pay | Admitting: Internal Medicine

## 2021-08-28 ENCOUNTER — Other Ambulatory Visit: Payer: Self-pay

## 2021-08-28 VITALS — BP 101/67 | HR 67 | Ht 64.0 in | Wt 141.0 lb

## 2021-08-28 DIAGNOSIS — Z86018 Personal history of other benign neoplasm: Secondary | ICD-10-CM

## 2021-08-28 DIAGNOSIS — I361 Nonrheumatic tricuspid (valve) insufficiency: Secondary | ICD-10-CM | POA: Diagnosis not present

## 2021-08-28 DIAGNOSIS — E0590 Thyrotoxicosis, unspecified without thyrotoxic crisis or storm: Secondary | ICD-10-CM | POA: Insufficient documentation

## 2021-08-28 MED ORDER — LORAZEPAM 1 MG PO TABS: ORAL_TABLET | ORAL | 0 refills | Status: DC

## 2021-08-28 NOTE — Patient Instructions (Signed)
Medication Instructions:  Your physician recommends that you continue on your current medications as directed. Please refer to the Current Medication list given to you today.  Take 30 minutes prior to Cardiac MRI: Ativan 1 mg by mouth  *If you need a refill on your cardiac medications before your next appointment, please call your pharmacy*   Lab Work: NONE If you have labs (blood work) drawn today and your tests are completely normal, you will receive your results only by: [place] (if you have MyChart) OR A paper copy in the mail If you have any lab test that is abnormal or we need to change your treatment, we will call you to review the results.   Testing/Procedures: Your physician has requested that you have a Cardiac MRI.    Follow-Up:  As needed  At [facility], you and your health needs are our priority.  As part of our continuing mission to provide you with exceptional heart care, we have created designated Provider Care Teams.  These Care Teams include your primary Cardiologist (physician) and Advanced Practice Providers (APPs -  Physician Assistants and Nurse Practitioners) who all work together to provide you with the care you need, when you need it.   Provider:   Werner Lean, MD     Other Instructions    You are scheduled for Cardiac MRI on ______________. Please arrive at the [facility] main entrance of [facility] at ________________ (30-45 minutes prior to test start time). ?  [facility] [address] [place], [place] [address] [phone]  Please take advantage of the free valet parking available at the MAIN entrance (A entrance). Proceed to the [facility] Radiology Department (First Floor). ? Magnetic resonance imaging (MRI) is a painless test that produces images of the inside of the body without using Xrays.  During an MRI, strong magnets and radio waves work together in a Research officer, political party to form detailed  images.   MRI images may provide more details about a medical condition than X-rays, CT scans, and ultrasounds can provide.  You may be given earphones to listen for instructions.  You may eat a light breakfast and take medications as ordered with the exception of HCTZ (fluid pill, other). Please avoid stimulants for 12 hr prior to test. (Ie. Caffeine, nicotine, chocolate, or antihistamine medications)  If a contrast material will be used, an IV will be inserted into one of your veins. Contrast material will be injected into your IV. It will leave your body through your urine within a day. You may be told to drink plenty of fluids to help flush the contrast material out of your system.  You will be asked to remove all metal, including: Watch, jewelry, and other metal objects including hearing aids, hair pieces and dentures. Also wearable glucose monitoring systems (ie. Freestyle Libre and Omnipods) (Braces and fillings normally are not a problem.)   TEST WILL TAKE APPROXIMATELY 1 HOUR  PLEASE NOTIFY SCHEDULING AT LEAST 24 HOURS IN ADVANCE IF YOU ARE UNABLE TO KEEP YOUR APPOINTMENT. [phone]  Please call Marchia Bond, cardiac imaging nurse navigator with any questions/concerns. Marchia Bond RN Navigator Cardiac Imaging Gordy Clement RN Navigator Cardiac Imaging [facility] [phone] Office

## 2021-10-01 ENCOUNTER — Ambulatory Visit (HOSPITAL_COMMUNITY): Payer: Medicaid Other

## 2021-10-05 ENCOUNTER — Telehealth (HOSPITAL_COMMUNITY): Payer: Self-pay | Admitting: Emergency Medicine

## 2021-10-05 NOTE — Telephone Encounter (Signed)
Attempted to call patient regarding upcoming cardiac MR appointment. Left message on voicemail with name and callback number Ohanna Gassert RN Navigator Cardiac Imaging [facility] [phone] Office [phone] Cell  

## 2021-10-06 ENCOUNTER — Other Ambulatory Visit: Payer: Self-pay

## 2021-10-06 ENCOUNTER — Ambulatory Visit (HOSPITAL_COMMUNITY)
Admission: RE | Admit: 2021-10-06 | Discharge: 2021-10-06 | Disposition: A | Discharge status: Home / Self Care | Payer: Medicaid Other | Source: Ambulatory Visit | Attending: Internal Medicine | Admitting: Internal Medicine

## 2021-10-06 DIAGNOSIS — I361 Nonrheumatic tricuspid (valve) insufficiency: Secondary | ICD-10-CM | POA: Diagnosis present

## 2021-10-06 DIAGNOSIS — Z86018 Personal history of other benign neoplasm: Secondary | ICD-10-CM | POA: Insufficient documentation

## 2021-10-06 DIAGNOSIS — E0590 Thyrotoxicosis, unspecified without thyrotoxic crisis or storm: Secondary | ICD-10-CM | POA: Diagnosis not present

## 2021-10-06 MED ORDER — GADOBUTROL 1 MMOL/ML IV SOLN
7.0000 mL | Freq: Once | INTRAVENOUS | Status: AC | PRN
  Administered 2021-10-06: 7 mL via INTRAVENOUS

## 2021-10-21 ENCOUNTER — Telehealth: Payer: Self-pay | Admitting: Internal Medicine

## 2021-10-21 ENCOUNTER — Encounter (HOSPITAL_COMMUNITY): Payer: Self-pay | Admitting: Vascular Surgery

## 2021-10-21 ENCOUNTER — Encounter (HOSPITAL_COMMUNITY): Payer: Self-pay | Admitting: Otolaryngology

## 2021-10-21 NOTE — Telephone Encounter (Signed)
Called pt in regards to heart flutters.  Reports heart flutters started around 10/05/21.  Flutters come and go throughout the day.  Pt also reports left sided neck pain, that goes to lower back and down to thigh.  Expresses it feels as if she has exercised, but hasn't.  Advised pt to contact PCP to address this issue.  Pt reports increased caffeine intake (coffee), continues to smoke, and experiencing increased stress.  Advised pt that all of these can contribute to heart flutters.  Pt also reports scheduled to have Thyroid surgery next Monday.  Wants to know if she should cancel her ride.  Advised pt not to cancel ride.  Will route to MD to review.   ?

## 2021-10-21 NOTE — H&P (Signed)
HPI:   Melanie [name] is a 36 y.o. female who presents as a consult Patient.   Referring Provider: Avbuere, Edwin Aziegbe,*  Chief complaint: Thyroid mass.  HPI: Found to have a left-sided thyroid mass a couple of months ago. Recent ultrasound reveals a dominant nodule that is suspicious and meets criteria for tissue sampling. Otherwise in good health. She is getting ready to see a dentist about a dental problem in February.  PMH/Meds/All/SocHx/FamHx/ROS:   Past Medical History:  Diagnosis Date   Diabetes mellitus (HCC)   Past Surgical History:  Procedure Laterality Date   NO PAST SURGERIES   No family history of bleeding disorders, wound healing problems or difficulty with anesthesia.   Social History   Socioeconomic History   Marital status: Single  Spouse name: Not on file   Number of children: Not on file   Years of education: Not on file   Highest education level: Not on file  Occupational History   Not on file  Tobacco Use   Smoking status: Some Days  Types: Cigarettes   Smokeless tobacco: Never  Vaping Use   Vaping Use: Never used  Substance and Sexual Activity   Alcohol use: Not on file   Drug use: Not on file   Sexual activity: Not on file  Other Topics Concern   Not on file  Social History Narrative   Not on file   Social Determinants of Health   Financial Resource Strain: Not on file  Food Insecurity: Not on file  Transportation Needs: Not on file  Physical Activity: Not on file  Stress: Not on file  Social Connections: Not on file  Housing Stability: Not on file   Current Outpatient Medications:   ACCU-CHEK FASTCLIX LANCET DRUM Misc, USE EVERY DAY FOR GLUCOSE TESTING, Disp: , Rfl:   ACCU-CHEK FASTCLIX LANCING DEV Kit, USE AS DIRECTED, Disp: , Rfl:   ACCU-CHEK GUIDE TEST STRIPS Strp test strips, , Disp: , Rfl:   ergocalciferol (VITAMIN D2) 1,250 mcg (50,000 unit) capsule, Take 1 capsule by mouth once a week., Disp: , Rfl:   hydrOXYzine HCL  (ATARAX) 25 MG tablet, , Disp: , Rfl:   omeprazole (PRILOSEC) 20 MG capsule, Take 20 mg by mouth 2 times daily., Disp: , Rfl:   A complete ROS was performed with pertinent positives/negatives noted in the HPI. The remainder of the ROS are negative.   Physical Exam:   BP 138/80  Pulse 75  Temp 97.7 F (36.5 C)  Ht 1.626 m (5' 4")  Wt 65.3 kg (144 lb)  BMI 24.72 kg/m   General: Healthy and alert, in no distress, breathing easily. Normal affect. In a pleasant mood. Head: Normocephalic, atraumatic. No masses, or scars. Eyes: Pupils are equal, and reactive to light. Vision is grossly intact. No spontaneous or gaze nystagmus. Ears: Ear canals are clear. Tympanic membranes are intact, with normal landmarks and the middle ears are clear and healthy. Hearing: Grossly normal. Nose: Nasal cavities are clear with healthy mucosa, no polyps or exudate. Airways are patent. Face: No masses or scars, facial nerve function is symmetric. Oral Cavity: No mucosal abnormalities are noted. Tongue with normal mobility. Dentition appears healthy. Oropharynx: Tonsils are symmetric. There are no mucosal masses identified. Tongue base appears normal and healthy. Larynx/Hypopharynx: indirect exam reveals healthy, mobile vocal cords, without mucosal lesions in the hypopharynx or larynx. Chest: Deferred Neck: Large mobile relatively soft thyroid mass on the left, approximately 4 to 5 cm. There is a submental node also   palpable at the midline that is nontender. According to the patient its been there for a long time.Marland Kitchen ?Neuro: Cranial nerves II-XII with normal function. ?Balance: Normal gate. ?Other findings: none. ? ?Independent Review of Additional Tests or Records:  ?Ultrasound reviewed. ? ?Procedures:  ?Procedure Note: ? ?Indications for procedure: thyroid mass ? ?Details of the procedure were discussed with the patient and all questions were answered. ? ?Procedure: ? ?2% xylocaine with epinephrine was infiltrated  into the overlying skin. ?First pass was made with a 25 gauge needle and 10 cc syringe. Second pass was made with a 22 gauge needle. Specimen was placed on microscopic slides and air-dried. Additional material was placed in Cytolyte solution for cell block preparation. A third sample was obtained with a 22 guage needle and sent for reflex Afirma testing. ? ?A bandage was applied.  ? ?She tolerated the procedure well. Results will be discussed when available. ? ?Impression & Plans:  ?Dominant thyroid nodule on the left. FNA performed. We will discuss results when available.  ? ?

## 2021-10-21 NOTE — Telephone Encounter (Signed)
Pt states that she has been feeling heart "flutters" since about the 27th of last month. Pt states she called EMS last week and they stated that everything checked out fine. HR and BP has been normal. Pt states that she has an up coming surgery and wants advice. Please advise ?

## 2021-10-26 ENCOUNTER — Ambulatory Visit (HOSPITAL_COMMUNITY): Admission: RE | Admit: 2021-10-26 | Payer: Medicaid Other | Source: Home / Self Care | Admitting: Otolaryngology

## 2021-10-26 ENCOUNTER — Encounter (HOSPITAL_COMMUNITY): Admission: RE | Payer: Self-pay | Source: Home / Self Care

## 2021-10-26 ENCOUNTER — Encounter: Payer: Self-pay | Admitting: Internal Medicine

## 2021-10-26 SURGERY — THYROIDECTOMY
Anesthesia: General

## 2021-10-26 NOTE — Telephone Encounter (Addendum)
I called the pt and she reports that she was cleared for surgery with Cardiology and it was planned for today 10/27/21 and she reported to her surgeon that she had "new palpitations".... that started 10/05/21 and have subsided 2 days ago.... but her surgeon wanted her to address with Cardio before proceeding with her surgery.  ? ?Pt reports that she was having palpitations on and off and denies dizziness and SOB associated with it.  ? ?She says it started with 2 shots of alcohol which she rarely does... and she has been taking an OTC med for headaches along with increased coffee recently... she will stop those things but says she has already had some relief the last 2 days.  ? ?I will forward to Dr. Gasper Sells for his review and recommendations.  ? ?

## 2021-10-26 NOTE — Telephone Encounter (Signed)
I called the pt and advised her hat Dr. Gasper Sells can see her tomorrow per his recommendation but she says that she has to get a ride and needs 2 days notice... she will check with her brother and see if he is available at all this week with his work schedule and let me know... I will forward to Dr. Oralia Rud nurse to let the pt know if he has any cancellations later in the week this week.  ?

## 2021-10-29 ENCOUNTER — Ambulatory Visit (INDEPENDENT_AMBULATORY_CARE_PROVIDER_SITE_OTHER): Payer: Medicaid Other

## 2021-10-29 ENCOUNTER — Ambulatory Visit (INDEPENDENT_AMBULATORY_CARE_PROVIDER_SITE_OTHER): Payer: Medicaid Other | Admitting: Internal Medicine

## 2021-10-29 ENCOUNTER — Encounter: Payer: Self-pay | Admitting: Internal Medicine

## 2021-10-29 VITALS — BP 120/70 | HR 57 | Ht 64.0 in | Wt 144.0 lb

## 2021-10-29 DIAGNOSIS — R002 Palpitations: Secondary | ICD-10-CM | POA: Diagnosis not present

## 2021-10-29 DIAGNOSIS — I361 Nonrheumatic tricuspid (valve) insufficiency: Secondary | ICD-10-CM

## 2021-10-29 DIAGNOSIS — D151 Benign neoplasm of heart: Secondary | ICD-10-CM

## 2021-10-29 NOTE — Patient Instructions (Signed)
Medication Instructions:  ?Your physician recommends that you continue on your current medications as directed. Please refer to the Current Medication list given to you today. ? ?*If you need a refill on your cardiac medications before your next appointment, please call your pharmacy* ? ? ?Lab Work: ?NONE ?If you have labs (blood work) drawn today and your tests are completely normal, you will receive your results only by: ?MyChart Message (if you have MyChart) OR ?A paper copy in the mail ?If you have any lab test that is abnormal or we need to change your treatment, we will call you to review the results. ? ? ?Testing/Procedures: ?Your physician has requested that you wear a 48 hour heart monitor.  ? ? ?Follow-Up: ?At [facility], you and your health needs are our priority.  As part of our continuing mission to provide you with exceptional heart care, we have created designated Provider Care Teams.  These Care Teams include your primary Cardiologist (physician) and Advanced Practice Providers (APPs -  Physician Assistants and Nurse Practitioners) who all work together to provide you with the care you need, when you need it. ? ?We recommend signing up for the patient portal called "MyChart".  Sign up information is provided on this After Visit Summary.  MyChart is used to connect with patients for Virtual Visits (Telemedicine).  Patients are able to view lab/test results, encounter notes, upcoming appointments, etc.  Non-urgent messages can be sent to your provider as well.   ?To learn more about what you can do with MyChart, go to [email or web address].   ? ?Your next appointment:   ?1 year(s) ? ?The format for your next appointment:   ?In Person ? ?Provider:   ?Werner Lean, MD   ? ? ?Important Information About Sugar ? ? ? ? ?  ?

## 2021-10-29 NOTE — Progress Notes (Signed)
?Cardiology Office Note:   ? ?Date:  10/29/2021  ? ?ID:  Melanie [name], DOB January 11, 1986, MRN [id number] ? ?PCP:  Nolene Ebbs, MD ?  ?[facility] ?Cardiologist:  Werner Lean, MD    ? ?Referring MD: Nolene Ebbs, MD  ? ?CC: DOD-palpitations ? ?History of Present Illness:   ? ?Melanie [name] is a 36 y.o. female with a hx of history of atrial myxoma removed at age 78.  History of thyroid disease and is s/p thyroidectomy.  CMR stable (no evidence of recurrent atrial mass). ? ?Patient notes that she is doing poorly.   ?She was stressed about her upcoming surgery. ?She stopper her anxiety medications because of fatigue and her need to take care of her kids. ?She had anxiety about her surgery and drank more then usual because of her mood. ?Had palpitations and chest pain- called EMS and was thought to be an anxiety attack. ?Her surgery was cancelled because of prior cardiac history.  ?Still having some palpitations. ? ? ?Past Medical History:  ?Diagnosis Date  ? ADHD (attention deficit hyperactivity disorder)   ? Anemia   ? Anxiety   ? Cancer [facility]) 2023  ? Thyroid cancer  ? Cardiac tumor   ? Atrial myxoma, removed at age 62  ? Crohn's disease ([place])   ? Fibroids   ? GERD (gastroesophageal reflux disease)   ? Gestational diabetes   ? Gestational diabetes mellitus in pregnancy   ? History of genital warts   ? ? ?Past Surgical History:  ?Procedure Laterality Date  ? CARDIAC SURGERY    ? at 36 years old  ? COLONOSCOPY W/ BIOPSIES  08/2012  ? active ileitis, normal colon (bxs of both) Dr. Oletta Lamas  ? TUBAL LIGATION Bilateral 02/15/2014  ? Procedure: POST PARTUM TUBAL LIGATION;  Surgeon: Farrel Gobble. Harrington Challenger, MD;  Location: [facility];  Service: Gynecology;  Laterality: Bilateral;  ? ? ?Current Medications: ?Current Meds  ?Medication Sig  ? hydrOXYzine (ATARAX) 25 MG tablet Take 25 mg by mouth daily as needed for anxiety.  ? omeprazole (PRILOSEC) 20 MG capsule Take 20 mg by mouth 2 (two) times daily.  ?  Vitamin D, Ergocalciferol, (DRISDOL) 1.25 MG (50000 UNIT) CAPS capsule Take 50,000 Units by mouth once a week.  ?  ? ?Allergies:   Patient has no known allergies.  ? ?Social History  ? ?Socioeconomic History  ? Marital status: Significant Other  ?  Spouse name: Not on file  ? Number of children: 4  ? Years of education: Not on file  ? Highest education level: Not on file  ?Occupational History  ? Occupation: Stay at home mom  ?Tobacco Use  ? Smoking status: Never  ? Smokeless tobacco: Never  ?Vaping Use  ? Vaping Use: Never used  ?Substance and Sexual Activity  ? Alcohol use: Yes  ?  Alcohol/week: 0.0 standard drinks  ?  Comment: occasionally.  ? Drug use: Yes  ?  Types: Marijuana  ?  Comment: Smokes to help with sleep  ? Sexual activity: Yes  ?  Birth control/protection: None  ?Other Topics Concern  ? Not on file  ?Social History Narrative  ? Not on file  ? ?Social Determinants of Health  ? ?Financial Resource Strain: Not on file  ?Food Insecurity: Not on file  ?Transportation Needs: Not on file  ?Physical Activity: Not on file  ?Stress: Not on file  ?Social Connections: Not on file  ?  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 ?Family History: ?The patient's family history includes Alcohol abuse in her father; Diabetes in her father, maternal grandmother, and paternal grandmother; Heart Problems in her maternal aunt and mother; Hypertension in her mother; Miscarriages / [place] in her mother. There is no history of Colon cancer, Colon polyps, Kidney disease, or Esophageal cancer. ? ?ROS:   ?Please see the history of present illness.    ? All other systems reviewed and are negative. ? ?EKGs/Labs/Other Studies Reviewed:   ? ?The following studies were reviewed today: ? ? ?EKG:  EKG is  ordered today.  The ekg ordered today demonstrates  ?10/29/21 sinus bradycardia 57  ?08/26/21: SR rate 67  ? ?Transthoracic Echocardiogram: ?Date: 10/01/2013 ?Results: ?- Left ventricle: The cavity size was normal. Systolic  ?   function was vigorous. The estimated ejection fraction was  ?  in the range of 65% to 70%. Wall motion was normal; there  ?  were no regional wall motion abnormalities. Left  ?  ventricular diastolic function parameters were normal.  ?- Left atrium: There is possible mass at the lateral wall of  ?  the left atrium. Further evaluation with cardiac MRI is  ?  recommended. The atrium was mildly dilated.  ?- Right ventricle: The cavity size was mildly dilated. Wall  ?  thickness was normal. Systolic function was mildly  ?  reduced.  ?- Atrial septum: A septal defect cannot be excluded.  ?- Tricuspid valve: Moderate-severe regurgitation.  ? ?Recent Labs: ?08/05/2021: BUN 7; Creatinine, Ser 0.76; Hemoglobin 11.0; Platelets 380; Potassium 3.8; Sodium 136  ?Recent Lipid Panel ?No results found for: CHOL, TRIG, HDL, CHOLHDL, VLDL, LDLCALC, LDLDIRECT ? ?    ? ?Physical Exam:   ? ?VS:  BP 120/70 (BP Location: Left Arm, Patient Position: Sitting, Cuff Size: Normal)   Pulse (!) 57   Ht '5\' 4"'$  (1.626 m)   Wt 144 lb (65.3 kg)   BMI 24.72 kg/m?    ? ?Wt Readings from Last 3 Encounters:  ?10/29/21 144 lb (65.3 kg)  ?08/28/21 141 lb (64 kg)  ?08/05/21 142 lb 12.8 oz (64.8 kg)  ?  ? ?Gen: no distress   ?Neck: No JVD, no carotid bruit ?Cardiac: No Rubs or Gallops, no Murmur, RRR +radial pulses ?Respiratory: Clear to auscultation bilaterally, normal effort, normal  respiratory rate ?GI: Soft, nontender, non-distended  ?MS: No  edema;  moves all extremities ?Integument: Skin feels warm ?Neuro:  At time of evaluation, alert and oriented to person/place/time/situation  ?Psych: Normal affect, patient feels well ? ? ?ASSESSMENT:   ? ?1. Palpitations   ?2. Cardiac myxoma   ?3. Nonrheumatic tricuspid valve regurgitation   ? ?PLAN:   ? ?Left atrial mass  ?Pre-op risk stratification ?Palpitations ?- I suspect this is related to her anxiety ?- given that her surgery was canceled, will do 48 hour monitor (non live), unless concerning arrhythmia  no barriers to surgery ?- she has no barriers to donate plasma because of her heart; thought there is no data on being s/p LA myxoma and outcomes with plasma donation ? ?One year with me ? ? ? ?Medication Adjustments/Labs and Tests Ordered: ?Current medicines are reviewed at length with the patient today.  Concerns regarding medicines are outlined above.  ?Orders Placed This Encounter  ?Procedures  ? LONG TERM MONITOR (3-14 DAYS)  ? EKG 12-Lead  ? ?No orders of the defined types were placed in this encounter. ? ? ?Patient Instructions  ? Medication Instructions:  ?Your physician  recommends that you continue on your current medications as directed. Please refer to the Current Medication list given to you today. ? ?*If you need a refill on your cardiac medications before your next appointment, please call your pharmacy* ? ? ?Lab Work: ?NONE ?If you have labs (blood work) drawn today and your tests are completely normal, you will receive your results only by: ?MyChart Message (if you have MyChart) OR ?A paper copy in the mail ?If you have any lab test that is abnormal or we need to change your treatment, we will call you to review the results. ? ? ?Testing/Procedures: ?Your physician has requested that you wear a 48 hour heart monitor.  ? ? ?Follow-Up: ?At [facility], you and your health needs are our priority.  As part of our continuing mission to provide you with exceptional heart care, we have created designated Provider Care Teams.  These Care Teams include your primary Cardiologist (physician) and Advanced Practice Providers (APPs -  Physician Assistants and Nurse Practitioners) who all work together to provide you with the care you need, when you need it. ? ?We recommend signing up for the patient portal called "MyChart".  Sign up information is provided on this After Visit Summary.  MyChart is used to connect with patients for Virtual Visits (Telemedicine).  Patients are able to view lab/test results,  encounter notes, upcoming appointments, etc.  Non-urgent messages can be sent to your provider as well.   ?To learn more about what you can do with MyChart, go to [email or web address].   ? ?Your next appointment:

## 2021-10-29 NOTE — Progress Notes (Unsigned)
[id number] ZIO XT from office inventory applied to patient. ?

## 2021-11-04 ENCOUNTER — Emergency Department (HOSPITAL_COMMUNITY): Payer: Medicaid Other

## 2021-11-04 ENCOUNTER — Encounter: Payer: Self-pay | Admitting: Internal Medicine

## 2021-11-04 ENCOUNTER — Emergency Department (HOSPITAL_COMMUNITY)
Admission: EM | Admit: 2021-11-04 | Discharge: 2021-11-04 | Disposition: A | Discharge status: Home / Self Care | Payer: Medicaid Other | Attending: Emergency Medicine | Admitting: Emergency Medicine

## 2021-11-04 DIAGNOSIS — F419 Anxiety disorder, unspecified: Secondary | ICD-10-CM | POA: Insufficient documentation

## 2021-11-04 DIAGNOSIS — M7918 Myalgia, other site: Secondary | ICD-10-CM | POA: Insufficient documentation

## 2021-11-04 DIAGNOSIS — R0789 Other chest pain: Secondary | ICD-10-CM | POA: Insufficient documentation

## 2021-11-04 DIAGNOSIS — M791 Myalgia, unspecified site: Secondary | ICD-10-CM

## 2021-11-04 LAB — BASIC METABOLIC PANEL
Anion gap: 8 (ref 5–15)
BUN: 10 mg/dL (ref 6–20)
CO2: 24 mmol/L (ref 22–32)
Calcium: 9.3 mg/dL (ref 8.9–10.3)
Chloride: 108 mmol/L (ref 98–111)
Creatinine, Ser: 0.77 mg/dL (ref 0.44–1.00)
GFR, Estimated: >60 mL/min (ref >60)
Glucose, Bld: 109 mg/dL — ABNORMAL HIGH (ref 70–99)
Potassium: 3.9 mmol/L (ref 3.5–5.1)
Sodium: 140 mmol/L (ref 135–145)

## 2021-11-04 LAB — CBC
HCT: 34.1 % — ABNORMAL LOW (ref 36.0–46.0)
Hemoglobin: 10.2 g/dL — ABNORMAL LOW (ref 12.0–15.0)
MCH: 23.3 pg — ABNORMAL LOW (ref 26.0–34.0)
MCHC: 29.9 g/dL — ABNORMAL LOW (ref 30.0–36.0)
MCV: 78 fL — ABNORMAL LOW (ref 80.0–100.0)
Platelets: 415 10*3/uL — ABNORMAL HIGH (ref 150–400)
RBC: 4.37 MIL/uL (ref 3.87–5.11)
RDW: 18.4 % — ABNORMAL HIGH (ref 11.5–15.5)
WBC: 6.9 10*3/uL (ref 4.0–10.5)
nRBC: 0 % (ref 0.0–0.2)

## 2021-11-04 LAB — TROPONIN I (HIGH SENSITIVITY)
Troponin I (High Sensitivity): 3 ng/L (ref <18)
Troponin I (High Sensitivity): 4 ng/L (ref <18)

## 2021-11-04 LAB — I-STAT BETA HCG BLOOD, ED (MC, WL, AP ONLY): I-stat hCG, quantitative: <5 m[IU]/mL (ref <5)

## 2021-11-04 MED ORDER — IOHEXOL 350 MG/ML SOLN
100.0000 mL | Freq: Once | INTRAVENOUS | Status: AC | PRN
  Administered 2021-11-04: 75 mL via INTRAVENOUS

## 2021-11-04 NOTE — ED Provider Triage Note (Signed)
Emergency Medicine Provider Triage Evaluation Note ? ?Melanie [name] , a 36 y.o. female  was evaluated in triage.  Pt complains of chest pain.  Pt has had multiple pains all over body.  Pt having pain left side of chest  Pt has a history of thyroid cancer.  Pt reports she is suppose to have surgery ? ?Review of Systems  ?Positive: Pain left chest ?Negative: Fever  ? ?Physical Exam  ?BP 117/80 (BP Location: Right Arm)   Pulse 70   Temp 98.9 ?F (37.2 ?C) (Oral)   Resp 20   LMP 11/01/2021   SpO2 100%  ?Gen:   Awake, no distress   ?Resp:  Normal effort  ?MSK:   Moves extremities without difficulty  ?Other:   ? ?Medical Decision Making  ?Medically screening exam initiated at 11:22 AM.  Appropriate orders placed.  [name] was informed that the remainder of the evaluation will be completed by another provider, this initial triage assessment does not replace that evaluation, and the importance of remaining in the ED until their evaluation is complete. ? ? ?  ?Melanie [name], [place] ?11/04/21 1124 ? ?

## 2021-11-04 NOTE — ED Notes (Signed)
Pt sitting in chair, EDP at side. Pt alert, NAD, calm, interactive.  ?

## 2021-11-04 NOTE — Discharge Instructions (Signed)
Tylenol for pain.  See your Physician for recheck.  Follow up with cardiology for recheck of monitor.   ?

## 2021-11-04 NOTE — ED Notes (Signed)
Delay in d/c d/t critical influx of pts into department.  ?

## 2021-11-04 NOTE — ED Triage Notes (Signed)
EMS stated, She had chest pain on and off since Mar 28. Wore a heart monitor and showed nothing. ?

## 2021-11-04 NOTE — ED Notes (Signed)
EDPA into room at time of d/c ?

## 2021-11-04 NOTE — ED Notes (Signed)
Alert, NAD, calm, interactive, resps e/u, speaking in clear complete sentences, VSS, to CT.  ?

## 2021-11-04 NOTE — ED Provider Notes (Signed)
?[facility] ?Provider Note ? ? ?CSN: [id number] ?Arrival date & time: 11/04/21  1027 ? ?  ? ?History ? ?Chief Complaint  ?Patient presents with  ? Chest Pain  ? ? ?Melanie [name] is a 36 y.o. female. ? ?Patient reports she had pain in the left side of her chest earlier today.  Patient states she is now having pain on the right lower side of her chest.  Patient wearing a monitor for cardiology.  She is currently being evaluated for chest pain.  Patient has a history of thyroid cancer and she is scheduled to have surgery by ENT once she is cleared by cardiology.  patient reports a recent history of having multiple pains in different locations of her body.  Patient became concerned today when she had pain in the left side of her chest.  Patient reports she has had heart surgery in the past for a myxoma.  Patient admits to a lot of stress recently.  She is the single parent of 4 children. ? ?The history is provided by the patient. No language interpreter was used.  ?Chest Pain ?Pain location:  L chest and R chest ?Pain quality: aching   ?Pain radiates to:  Does not radiate ?Relieved by:  Nothing ?Worsened by:  Nothing ?Ineffective treatments:  None tried ?Associated symptoms: no abdominal pain, no dizziness and no numbness   ?Risk factors: no high cholesterol, no hypertension, not pregnant and no smoking   ? ?  ? ?Home Medications ?Prior to Admission medications   ?Medication Sig Start Date End Date Taking? Authorizing Provider  ?hydrOXYzine (ATARAX) 25 MG tablet Take 25 mg by mouth daily as needed for anxiety. 06/26/21  Yes [provider]  ?omeprazole (PRILOSEC) 20 MG capsule Take 20 mg by mouth 2 (two) times daily. 07/07/21  Yes [provider]  ?Vitamin D, Ergocalciferol, (DRISDOL) 1.25 MG (50000 UNIT) CAPS capsule Take 50,000 Units by mouth once a week. 04/19/20  Yes [provider]  ?   ? ?Allergies    ?Patient has no known allergies.   ? ?Review  of Systems   ?Review of Systems  ?Cardiovascular:  Positive for chest pain.  ?Gastrointestinal:  Negative for abdominal pain.  ?Neurological:  Negative for dizziness and numbness.  ?All other systems reviewed and are negative. ? ?Physical Exam ?Updated Vital Signs ?BP 109/66   Pulse (!) 52   Temp 98.5 ?F (36.9 ?C) (Oral)   Resp 15   Ht '5\' 4"'$  (1.626 m)   Wt 65.3 kg   LMP 11/01/2021   SpO2 100%   BMI 24.72 kg/m?  ?Physical Exam ?Vitals and nursing note reviewed.  ?Constitutional:   ?   Appearance: She is well-developed.  ?HENT:  ?   Head: Normocephalic.  ?Cardiovascular:  ?   Rate and Rhythm: Normal rate and regular rhythm.  ?   Heart sounds: Normal heart sounds.  ?Pulmonary:  ?   Effort: Pulmonary effort is normal.  ?   Breath sounds: Normal breath sounds.  ?Abdominal:  ?   General: Bowel sounds are normal. There is no distension.  ?   Palpations: Abdomen is soft.  ?Musculoskeletal:     ?   General: Normal range of motion.  ?   Cervical back: Normal range of motion.  ?Neurological:  ?   Mental Status: She is alert and oriented to person, place, and time.  ? ? ?ED Results / Procedures / Treatments   ?Labs ?(all labs ordered  are listed, but only abnormal results are displayed) ?Labs Reviewed  ?BASIC METABOLIC PANEL - Abnormal; Notable for the following components:  ?    Result Value  ? Glucose, Bld 109 (*)   ? All other components within normal limits  ?CBC - Abnormal; Notable for the following components:  ? Hemoglobin 10.2 (*)   ? HCT 34.1 (*)   ? MCV 78.0 (*)   ? MCH 23.3 (*)   ? MCHC 29.9 (*)   ? RDW 18.4 (*)   ? Platelets 415 (*)   ? All other components within normal limits  ?I-STAT BETA HCG BLOOD, ED ([facility], [facility], [facility] ONLY)  ?TROPONIN I (HIGH SENSITIVITY)  ?TROPONIN I (HIGH SENSITIVITY)  ? ? ?EKG ?EKG Interpretation ? ?Date/Time:  Wednesday November 04 2021 11:04:33 EDT ?Ventricular Rate:  71 ?PR Interval:  112 ?QRS Duration: 80 ?QT Interval:  390 ?QTC Calculation: 423 ?R Axis:   86 ?Text Interpretation: Normal  sinus rhythm Benign early repolarization When compared with ECG of 05-Aug-2021 08:51, PREVIOUS ECG IS PRESENT Confirmed by Lennice Sites [phone]) on 11/04/2021 12:04:29 PM ? ?Radiology ?DG Chest 2 View ? ?Result Date: 11/04/2021 ?CLINICAL DATA:  Chest pain EXAM: CHEST - 2 VIEW COMPARISON:  Chest x-ray 09/27/2015 FINDINGS: Heart size and mediastinum appear stable and within normal limits. Median sternotomy wires. Lungs are hyperinflated with no focal consolidation identified. No pleural effusion or pneumothorax. IMPRESSION: No acute intrathoracic process identified. Electronically Signed   By: Ofilia Neas M.D.   On: 11/04/2021 11:56  ? ?CT Angio Chest PE W and/or Wo Contrast ? ?Result Date: 11/04/2021 ?CLINICAL DATA:  PE suspected EXAM: CT ANGIOGRAPHY CHEST WITH CONTRAST TECHNIQUE: Multidetector CT imaging of the chest was performed using the standard protocol during bolus administration of intravenous contrast. Multiplanar CT image reconstructions and MIPs were obtained to evaluate the vascular anatomy. RADIATION DOSE REDUCTION: This exam was performed according to the departmental dose-optimization program which includes automated exposure control, adjustment of the mA and/or kV according to patient size and/or use of iterative reconstruction technique. CONTRAST:  7m OMNIPAQUE IOHEXOL 350 MG/ML SOLN COMPARISON:  Thyroid ultrasound, 05/26/2021 FINDINGS: Cardiovascular: Satisfactory opacification of the pulmonary arteries to the segmental level. No evidence of pulmonary embolism. Normal heart size. No pericardial effusion. Mediastinum/Nodes: No enlarged mediastinal, hilar, or axillary lymph nodes. Enlarged, heterogeneous left lobe of the thyroid, which deflects the trachea to the right, previously evaluated by dedicated thyroid ultrasound. This has been evaluated on previous imaging. (ref: J Am Coll Radiol. 2015 Feb;12(2): 143-50). Esophagus demonstrates no significant findings. Lungs/Pleura: Lungs are clear. No  pleural effusion or pneumothorax. Upper Abdomen: No acute abnormality. Musculoskeletal: No chest wall abnormality. No acute osseous findings. Review of the MIP images confirms the above findings. IMPRESSION: Negative examination for pulmonary embolism. Electronically Signed   By: ADelanna AhmadiM.D.   On: 11/04/2021 14:21   ? ?Procedures ?Procedures  ? ? ?Medications Ordered in ED ?Medications  ?iohexol (OMNIPAQUE) 350 MG/ML injection 100 mL (75 mLs Intravenous Contrast Given 11/04/21 1414)  ? ? ?ED Course/ Medical Decision Making/ A&P ?  ?                        ?Medical Decision Making ?Patient complains of multiple areas of body pain.  Patient had pain in the left side of her chest today. ? ?Problems Addressed: ?Anxiety: acute illness or injury ?   Details: Patient reports multiple stressors she is having problems with her children and  she is anxious about Royd surgery ? ?Amount and/or Complexity of Data Reviewed ?External Data Reviewed: notes. ?   Details: Cardiology notes reviewed.  Patient had a recent MRI which was normal ?Labs: ordered. Decision-making details documented in ED Course. ?   Details: Patient's troponin is negative x2 ?Radiology: ordered and independent interpretation performed. ?   Details: CT angio of chest is obtained due to concerning history of cancer and patient's chest pain.  CT scan shows no evidence of PE no acute abnormality ?ECG/medicine tests: ordered and independent interpretation performed. Decision-making details documented in ED Course. ?   Details: EKG shows no acute changes ? ?Risk ?Prescription drug management. ?Risk Details: Patient symptoms are atypical for cardiac chest pain/angina.  Evaluation was done to rule out MI/pulmonary embolus.  Patient is counseled on anxiety she is advised to follow-up with cardiology for review of monitor as well and has follow-up with primary care for anxiety she is to keep appointment with ENT for thyroid surgery ? ? ? ? ? ? ? ? ? ? ?Final  Clinical Impression(s) / ED Diagnoses ?Final diagnoses:  ?Atypical chest pain  ?Myalgia  ?Anxiety  ? ? ?Rx / DC Orders ?ED Discharge Orders   ? ? None  ? ?  ? ?An After Visit Summary was printed and given to the pat

## 2021-11-19 ENCOUNTER — Encounter: Payer: Self-pay | Admitting: Internal Medicine

## 2021-11-22 ENCOUNTER — Encounter: Payer: Self-pay | Admitting: Internal Medicine

## 2021-11-26 ENCOUNTER — Telehealth: Payer: Self-pay | Admitting: Internal Medicine

## 2021-11-26 NOTE — Telephone Encounter (Signed)
Pt wanted to make provider aware that she is just mailing the heart monitor back today. Pt would like a callback back once results are back. Please advise

## 2021-12-10 ENCOUNTER — Telehealth: Payer: Self-pay | Admitting: Internal Medicine

## 2021-12-10 NOTE — Telephone Encounter (Signed)
   Pre-operative Risk Assessment    Patient Name: Melanie [name]  DOB: 1985/09/02 MRN: [id number]      Request for Surgical Clearance    Procedure:   totally thyroid ectomy   Date of Surgery:  Clearance TBD                                 Surgeon:  Dr. Avel Peace  Surgeon's Group or Practice Name:  [facility]  Phone number:  [phone] Fax number:  [phone]   Type of Clearance Requested:   - Medical    Type of Anesthesia:  General    Additional requests/questions:      SignedMilbert Coulter   12/10/2021, 4:53 PM

## 2021-12-10 NOTE — Telephone Encounter (Signed)
    Patient Name: JENNALYN CAWLEY  DOB: 08/13/1985 MRN: [id number]  Primary Cardiologist: Werner Lean, MD  Chart reviewed as part of pre-operative protocol coverage. Given past medical history and time since last visit, based on ACC/AHA guidelines, ILSA BONELLO would be at acceptable risk for the planned procedure without further cardiovascular testing.   Recent heart monitor was reassuring.  Cardiac MRI did not show any recurrent myxoma.  I will route this recommendation to the requesting party via Epic fax function and remove from pre-op pool.  Please call with questions.  [place], [place] 12/10/2021, 5:27 PM

## 2022-01-31 NOTE — H&P (Signed)
HPI:   Melanie [name] is a 36 y.o. female who presents as a consult Patient.   Referring Provider: Tanna Savoy,*  Chief complaint: Thyroid mass.  HPI: Found to have a left-sided thyroid mass a couple of months ago. Recent ultrasound reveals a dominant nodule that is suspicious and meets criteria for tissue sampling. Otherwise in good health. She is getting ready to see a dentist about a dental problem in February.  PMH/Meds/All/SocHx/FamHx/ROS:   Past Medical History:  Diagnosis Date   Diabetes mellitus ([place])   Past Surgical History:  Procedure Laterality Date   NO PAST SURGERIES   No family history of bleeding disorders, wound healing problems or difficulty with anesthesia.   Social History   Socioeconomic History   Marital status: Single  Spouse name: Not on file   Number of children: Not on file   Years of education: Not on file   Highest education level: Not on file  Occupational History   Not on file  Tobacco Use   Smoking status: Some Days  Types: Cigarettes   Smokeless tobacco: Never  Vaping Use   Vaping Use: Never used  Substance and Sexual Activity   Alcohol use: Not on file   Drug use: Not on file   Sexual activity: Not on file  Other Topics Concern   Not on file  Social History Narrative   Not on file   Social Determinants of Health   Financial Resource Strain: Not on file  Food Insecurity: Not on file  Transportation Needs: Not on file  Physical Activity: Not on file  Stress: Not on file  Social Connections: Not on file  Housing Stability: Not on file   Current Outpatient Medications:   ACCU-CHEK FASTCLIX LANCET DRUM Misc, USE EVERY DAY FOR GLUCOSE TESTING, Disp: , Rfl:   ACCU-CHEK FASTCLIX LANCING DEV Kit, USE AS DIRECTED, Disp: , Rfl:   ACCU-CHEK GUIDE TEST STRIPS Strp test strips, , Disp: , Rfl:   ergocalciferol (VITAMIN D2) 1,250 mcg (50,000 unit) capsule, Take 1 capsule by mouth once a week., Disp: , Rfl:   hydrOXYzine HCL  (ATARAX) 25 MG tablet, , Disp: , Rfl:   omeprazole (PRILOSEC) 20 MG capsule, Take 20 mg by mouth 2 times daily., Disp: , Rfl:   A complete ROS was performed with pertinent positives/negatives noted in the HPI. The remainder of the ROS are negative.   Physical Exam:   BP 138/80  Pulse 75  Temp 97.7 F (36.5 C)  Ht 1.626 m (5' 4" )  Wt 65.3 kg (144 lb)  BMI 24.72 kg/m   General: Healthy and alert, in no distress, breathing easily. Normal affect. In a pleasant mood. Head: Normocephalic, atraumatic. No masses, or scars. Eyes: Pupils are equal, and reactive to light. Vision is grossly intact. No spontaneous or gaze nystagmus. Ears: Ear canals are clear. Tympanic membranes are intact, with normal landmarks and the middle ears are clear and healthy. Hearing: Grossly normal. Nose: Nasal cavities are clear with healthy mucosa, no polyps or exudate. Airways are patent. Face: No masses or scars, facial nerve function is symmetric. Oral Cavity: No mucosal abnormalities are noted. Tongue with normal mobility. Dentition appears healthy. Oropharynx: Tonsils are symmetric. There are no mucosal masses identified. Tongue base appears normal and healthy. Larynx/Hypopharynx: indirect exam reveals healthy, mobile vocal cords, without mucosal lesions in the hypopharynx or larynx. Chest: Deferred Neck: Large mobile relatively soft thyroid mass on the left, approximately 4 to 5 cm. There is a submental node also  palpable at the midline that is nontender. According to the patient its been there for a long time.. Neuro: Cranial nerves II-XII with normal function. Balance: Normal gate. Other findings: none.  Independent Review of Additional Tests or Records:  Ultrasound reviewed.  Procedures:  Procedure Note:  Indications for procedure: thyroid mass  Details of the procedure were discussed with the patient and all questions were answered.  Procedure:  2% xylocaine with epinephrine was infiltrated  into the overlying skin. First pass was made with a 25 gauge needle and 10 cc syringe. Second pass was made with a 22 gauge needle. Specimen was placed on microscopic slides and air-dried. Additional material was placed in Cytolyte solution for cell block preparation. A third sample was obtained with a 22 guage needle and sent for reflex Afirma testing.  A bandage was applied.   She tolerated the procedure well. Results will be discussed when available.  Impression & Plans:  Dominant thyroid nodule on the left. FNA performed. We will discuss results when available.

## 2022-02-01 NOTE — Pre-Procedure Instructions (Signed)
Surgical Instructions    Your procedure is scheduled on February 05, 2022.  Report to [facility] Main Entrance "A" at 5:30 A.M., then check in with the Admitting office.  Call this number if you have problems the morning of surgery:  [phone]   If you have any questions prior to your surgery date call [phone]: Open Monday-Friday 8am-4pm    Remember:  Do not eat or drink after midnight the night before your surgery     Take these medicines the morning of surgery with A SIP OF WATER:  escitalopram (LEXAPRO)   omeprazole (PRILOSEC)     As of today, STOP taking any Aspirin (unless otherwise instructed by your surgeon) Aleve, Naproxen, Ibuprofen, Motrin, Advil, Goody's, BC's, all herbal medications, fish oil, and all vitamins.                     Do NOT Smoke (Tobacco/Vaping) for 24 hours prior to your procedure.  If you use a CPAP at night, you may bring your mask/headgear for your overnight stay.   Contacts, glasses, piercing's, hearing aid's, dentures or partials may not be worn into surgery, please bring cases for these belongings.    For patients admitted to the hospital, discharge time will be determined by your treatment team.   Patients discharged the day of surgery will not be allowed to drive home, and someone needs to stay with them for 24 hours.  SURGICAL WAITING ROOM VISITATION Patients having surgery or a procedure may have no more than 2 support people in the waiting area - these visitors may rotate.   Children under the age of 41 must have an adult with them who is not the patient. If the patient needs to stay at the hospital during part of their recovery, the visitor guidelines for inpatient rooms apply. Pre-op nurse will coordinate an appropriate time for 1 support person to accompany patient in pre-op.  This support person may not rotate.   Please refer to the [facility] website for the visitor guidelines for Inpatients (after your surgery is over and you are  in a regular room).    Special instructions:   [place]- Preparing For Surgery  Before surgery, you can play an important role. Because skin is not sterile, your skin needs to be as free of germs as possible. You can reduce the number of germs on your skin by washing with CHG (chlorahexidine gluconate) Soap before surgery.  CHG is an antiseptic cleaner which kills germs and bonds with the skin to continue killing germs even after washing.    Oral Hygiene is also important to reduce your risk of infection.  Remember - BRUSH YOUR TEETH THE MORNING OF SURGERY WITH YOUR REGULAR TOOTHPASTE  Please do not use if you have an allergy to CHG or antibacterial soaps. If your skin becomes reddened/irritated stop using the CHG.  Do not shave (including legs and underarms) for at least 48 hours prior to first CHG shower. It is OK to shave your face.  Please follow these instructions carefully.   Shower the NIGHT BEFORE SURGERY and the MORNING OF SURGERY  If you chose to wash your hair, wash your hair first as usual with your normal shampoo.  After you shampoo, rinse your hair and body thoroughly to remove the shampoo.  Use CHG Soap as you would any other liquid soap. You can apply CHG directly to the skin and wash gently with a scrungie or a clean washcloth.  Apply the CHG Soap to your body ONLY FROM THE NECK DOWN.  Do not use on open wounds or open sores. Avoid contact with your eyes, ears, mouth and genitals (private parts). Wash Face and genitals (private parts)  with your normal soap.   Wash thoroughly, paying special attention to the area where your surgery will be performed.  Thoroughly rinse your body with warm water from the neck down.  DO NOT shower/wash with your normal soap after using and rinsing off the CHG Soap.  Pat yourself dry with a CLEAN TOWEL.  Wear CLEAN PAJAMAS to bed the night before surgery  Place CLEAN SHEETS on your bed the night before your surgery  DO NOT SLEEP  WITH PETS.   Day of Surgery: Take a shower with CHG soap. Do not wear jewelry or makeup Do not wear lotions, powders, perfumes/colognes, or deodorant. Do not shave 48 hours prior to surgery.   Do not bring valuables to the hospital.  [facility] is not responsible for any belongings or valuables. Do not wear nail polish, gel polish, artificial nails, or any other type of covering on natural nails (fingers and toes) If you have artificial nails or gel coating that need to be removed by a nail salon, please have this removed prior to surgery. Artificial nails or gel coating may interfere with anesthesia's ability to adequately monitor your vital signs. Wear Clean/Comfortable clothing the morning of surgery Remember to brush your teeth WITH YOUR REGULAR TOOTHPASTE.   Please read over the following fact sheets that you were given.    If you received a COVID test during your pre-op visit  it is requested that you wear a mask when out in public, stay away from anyone that may not be feeling well and notify your surgeon if you develop symptoms. If you have been in contact with anyone that has tested positive in the last 10 days please notify you surgeon.

## 2022-02-02 ENCOUNTER — Other Ambulatory Visit: Payer: Self-pay

## 2022-02-02 ENCOUNTER — Encounter (HOSPITAL_COMMUNITY): Payer: Self-pay | Admitting: Otolaryngology

## 2022-02-02 ENCOUNTER — Inpatient Hospital Stay (HOSPITAL_COMMUNITY)
Admission: RE | Admit: 2022-02-02 | Discharge: 2022-02-02 | Disposition: A | Discharge status: Home / Self Care | Payer: Medicaid Other | Source: Ambulatory Visit

## 2022-02-02 NOTE — Progress Notes (Signed)
PCP - Dr. Christean Grief Cardiologist - Rudean Haskell Recently wore heart monitor in April 2023  PPM/ICD - Denies Device Orders -  Rep Notified -   Chest x-ray - 11/04/21 EKG - 11/05/21 Stress Test - Denies ECHO - 10/01/13 Cardiac Cath - Denies  Sleep Study - Denies  DM - Type II Fasting Blood Sugar - 95 Checks Blood Sugar _2____ times a day  Blood Thinner Instructions:Denies Aspirin Instructions:Denies  Anesthesia review: Yes cardiac history  Patient denies shortness of breath, fever, cough and chest pain at PAT appointment   All instructions explained to the patient, with a verbal understanding of the material. Patient agrees to go over the instructions while at home for a better understanding.  The opportunity to ask questions was provided.

## 2022-02-03 NOTE — Progress Notes (Signed)
Anesthesia Chart Review: SAME DAY WORK-UP (she missed her 02/02/22 PAT visit)  Case: [id number] Date/Time: 02/05/22 0715   Procedure: TOTAL THYROIDECTOMY - REQUESTS RNFA   Anesthesia type: General   Pre-op diagnosis: Thyroid mass   Location: [facility] OR ROOM 09 / [facility]   Surgeons: Izora Gala, MD       DISCUSSION: Patient is a 36 year old female scheduled for the above procedure. 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  Definitive diagnosis would be best achieved on a resected specimen." Surgery rescheduled from January 2023 due to abnormal echo findings in 2015 in setting of pregnancy and prior atrial myxoma excision at age 28. Surgery was rescheduled for April 2023 but again rescheduled due to new onset palpitations (in setting of stopping anxiety medications and had episodic increased alcohol intake for anxiety related to upcoming surgery). She has since seen Dr. Whitney Post and had a cardiac MRI showing no recurrent myxoma and no malignant arrhythmias on event monitor and felt acceptable risk for planned surgery.   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  Cardiology preoperative input outline by Almyra Deforest, [place] on 12/10/21, "Given past medical history and time since last visit, based on ACC/AHA guidelines, LASHANNON BRESNAN would be at acceptable risk for the planned procedure without further cardiovascular testing.    Recent heart monitor was reassuring.  Cardiac MRI did not show any recurrent myxoma."  Anesthesia team to evaluate on the day of surgery. Since she did show for PAT visit, she will need labs on arrival as indicated.   VS: LMP 01/23/2022 (Exact Date)  BP Readings from Last 3 Encounters:   11/04/21 (!) 116/91  10/29/21 120/70  08/28/21 101/67   Pulse Readings from Last 3 Encounters:  11/04/21 70  10/29/21 (!) 57  08/28/21 67     PROVIDERS: Nolene Ebbs, MD is PCP  Rudean Haskell, MD is cardiologist   LABS: For day of surgery as indicated. On 05/12/21, CMP normal, A1c 5.5% and TSH 0.2 (0.27-4.20) with "Monitor thyroid panel" recommended by Dr. Jeanie Cooks. See labs scanned under Media tab, 08/07/21. As of 11/04/21, Cr 0.77, glucose 109, H/H 10.2/34.1, PLT 415.    IMAGES: CXR 11/04/21: FINDINGS: Heart size and mediastinum appear stable and within normal limits. Median sternotomy wires. Lungs are hyperinflated with no focal consolidation identified. No pleural effusion or pneumothorax. IMPRESSION: No acute intrathoracic process identified.   CTA Chest 11/04/21: IMPRESSION: Negative examination for pulmonary embolism. - No enlarged mediastinal, hilar, or axillary lymph nodes. Enlarged, heterogeneous left lobe of the thyroid, which deflects the trachea to the right, previously evaluated by dedicated thyroid ultrasound.  US Thyroid 05/26/21: IMPRESSION: 1. Left-sided thyroid nodule/mass, correlating with the palpable area of concern, meets imaging criteria to recommend percutaneous sampling as indicated. 2. Right-sided thyroid nodule meets imaging criteria to recommend a 1 year follow-up.   EKG: 11/04/21: Normal sinus rhythm Benign early repolarization When compared with ECG of 05-Aug-2021 08:51, PREVIOUS ECG IS PRESENT Confirmed by Ronnald Nian, [place] ([id number]) on 11/04/2021 12:04:29 PM - Negative T waves in V1-2, aVL. Tracing appears similar to 10/29/21 EKG at [facility].   CV: Long term monitor ZioXT 10/29/21-11/01/21: Patient had a minimum heart rate of 44 bpm, maximum heart rate of 218 bpm, and average heart rate of 81 bpm. Predominant underlying rhythm was sinus rhythm.  P-SVT (one run) lasting 10 beats at longest with a max rate of 218 bpm at  fastest. Isolated PACs and PVCs were rare (<1.0%). Triggered and diary events associated with sinus tachycardia.  No malignant arrhythmias.    MRI Cardiac 10/06/21: FINDINGS: 1. Normal left ventricular size, with LVEDD 36 mm, and LVEDVi 64 mL/m2. - Normal left ventricular thickness, with intraventricular septal thickness of 7 mm, posterior wall thickness of 8 mm, and myocardial mass index of 54 g/m2. - Normal left ventricular systolic function (LVEF =70%). Abnormal basal septal wall motion (non-ischemic pattern, consistent with prior cardiac surgery). - Left ventricular parametric mapping notable for normal T1 and T2 signal. - There is no late gadolinium enhancement in the left ventricular myocardium. 2. Normal right ventricular size with RVEDVI 73 mL/m2. - Normal right ventricular thickness. - Low normal right ventricular systolic function (RVEF =26%). There are no regional wall motion abnormalities or aneurysms. 3. Normal left and right atrial size. No evidence of residual left atrial mass. 4.  Normal size of the aortic root, ascending aorta. - Dilation of the main pulmonary artery, mild, 29 mm. 5. Valve assessment: - Aortic Valve: Tri-leaflet Aortic Valve. Qualitatively, there is no significant regurgitation. - Pulmonic Valve: Qualitatively, there is no significant regurgitation. - Tricuspid Valve: Qualitatively, there is no significant regurgitation. - Mitral Valve:  There is no significant regurgitation. 6.  Normal pericardium.  No pericardial effusion. 7. Grossly, no extracardiac findings, save for patients prior sternotomy. Recommended dedicated study if concerned for non-cardiac pathology. 8.  Wrap around artifacts noted. IMPRESSION: Normal left ventricular systolic function (LVEF =37%). Abnormal basal septal wall motion (non-ischemic pattern, consistent with prior cardiac surgery). Low normal RV function (RVEF =46%) with mild dilation of the pulmonary artery. No  evidence of residual left atrial myxoma.    Echo 10/01/13: Study Conclusions  - Left ventricle: The cavity size was normal. Systolic    function was vigorous. The estimated ejection fraction was    in the range of 65% to 70%. Wall motion was normal; there    were no regional wall motion abnormalities. Left    ventricular diastolic function parameters were normal.  - Left atrium: There is possible mass at the lateral wall of    the left atrium. Further evaluation with cardiac MRI is    recommended. The atrium was mildly dilated.  - Right ventricle: The cavity size was mildly dilated. Wall    thickness was normal. Systolic function was mildly    reduced.  - Atrial septum: A septal defect cannot be excluded.  - Tricuspid valve: Moderate-severe regurgitation.  - Pulmonary arteries: PA peak pressure: 65m Hg (S).    Past Medical History:  Diagnosis Date   ADHD (attention deficit hyperactivity disorder)    Anemia    Anxiety    Cancer ([facility] 2023   Thyroid cancer   Cardiac tumor    Atrial myxoma, removed at age 36  Crohn's disease ([place]    Fibroids    GERD (gastroesophageal reflux disease)    Gestational diabetes    Gestational diabetes mellitus in pregnancy    History of genital warts     Past Surgical History:  Procedure Laterality Date   CARDIAC SURGERY     at 36years old   COLONOSCOPY W/ BIOPSIES  08/2012   active ileitis, normal colon (bxs of both) Dr. EOletta Lamas  TUBAL LIGATION Bilateral 02/15/2014   Procedure: POST PARTUM TUBAL LIGATION;  Surgeon: KFarrel Gobble RHarrington Challenger MD;  [facility];  Service: Gynecology;  Laterality: Bilateral;    MEDICATIONS:  escitalopram (LEXAPRO) 20 MG tablet   hydrOXYzine (ATARAX) 25 MG tablet   omeprazole (PRILOSEC) 20 MG capsule   Vitamin D, Ergocalciferol, (DRISDOL) 1.25 MG (50000 UNIT) CAPS capsule   No current facility-administered medications for this encounter.    Myra Gianotti, PA-C Surgical Short Stay/Anesthesiology [facility] Phone  [phone] [facility] Phone [phone] 02/03/2022 11:46 AM

## 2022-02-03 NOTE — Anesthesia Preprocedure Evaluation (Addendum)
Anesthesia Evaluation  Patient identified by MRN, date of birth, ID band Patient awake    Reviewed: Allergy & Precautions, NPO status , Patient's Chart, lab work & pertinent test results  History of Anesthesia Complications Negative for: history of anesthetic complications  Airway Mallampati: II  TM Distance: >3 FB Neck ROM: Full    Dental  (+) Teeth Intact, Dental Advisory Given   Pulmonary neg pulmonary ROS,    Pulmonary exam normal        Cardiovascular Normal cardiovascular exam  H/o atrial mxyoma s/p excision in 2005- no recurrent myxoma on cardiac MRI   Neuro/Psych negative neurological ROS     GI/Hepatic Neg liver ROS, GERD  ,Crohn's disease   Endo/Other  negative endocrine ROSdiabetes  Renal/GU negative Renal ROS  negative genitourinary   Musculoskeletal negative musculoskeletal ROS (+)   Abdominal   Peds  Hematology  (+) Blood dyscrasia, anemia ,   Anesthesia Other Findings Thyroid cancer  Reproductive/Obstetrics                           Anesthesia Physical Anesthesia Plan  ASA: 2  Anesthesia Plan: General   Post-op Pain Management: Tylenol PO (pre-op)* and Toradol IV (intra-op)*   Induction: Intravenous  PONV Risk Score and Plan: 3 and Ondansetron, Dexamethasone, Treatment may vary due to age or medical condition and Midazolam  Airway Management Planned: Oral ETT  Additional Equipment: None  Intra-op Plan:   Post-operative Plan: Extubation in OR  Informed Consent: I have reviewed the patients History and Physical, chart, labs and discussed the procedure including the risks, benefits and alternatives for the proposed anesthesia with the patient or authorized representative who has indicated his/her understanding and acceptance.     Dental advisory given  Plan Discussed with:   Anesthesia Plan Comments: (PAT note written 02/03/2022 by Myra Gianotti, PA-C. SAME  DAY WORK-UP as she missed PAT appt.   )      Anesthesia Quick Evaluation

## 2022-02-05 ENCOUNTER — Other Ambulatory Visit: Payer: Self-pay

## 2022-02-05 ENCOUNTER — Encounter (HOSPITAL_COMMUNITY): Payer: Self-pay | Admitting: Otolaryngology

## 2022-02-05 ENCOUNTER — Observation Stay (HOSPITAL_COMMUNITY)
Admission: RE | Admit: 2022-02-05 | Discharge: 2022-02-06 | Disposition: A | Discharge status: Home / Self Care | Payer: Medicaid Other | Attending: Otolaryngology | Admitting: Otolaryngology

## 2022-02-05 ENCOUNTER — Ambulatory Visit (HOSPITAL_BASED_OUTPATIENT_CLINIC_OR_DEPARTMENT_OTHER): Payer: Medicaid Other | Admitting: Anesthesiology

## 2022-02-05 ENCOUNTER — Encounter (HOSPITAL_COMMUNITY)
Admission: RE | Disposition: A | Discharge status: Home / Self Care | Payer: Self-pay | Source: Home / Self Care | Attending: Otolaryngology

## 2022-02-05 ENCOUNTER — Ambulatory Visit (HOSPITAL_COMMUNITY): Payer: Medicaid Other | Admitting: Physician Assistant

## 2022-02-05 DIAGNOSIS — C73 Malignant neoplasm of thyroid gland: Secondary | ICD-10-CM | POA: Diagnosis present

## 2022-02-05 DIAGNOSIS — E042 Nontoxic multinodular goiter: Principal | ICD-10-CM | POA: Insufficient documentation

## 2022-02-05 DIAGNOSIS — E0789 Other specified disorders of thyroid: Secondary | ICD-10-CM

## 2022-02-05 DIAGNOSIS — E119 Type 2 diabetes mellitus without complications: Secondary | ICD-10-CM | POA: Diagnosis not present

## 2022-02-05 DIAGNOSIS — Z79899 Other long term (current) drug therapy: Secondary | ICD-10-CM | POA: Insufficient documentation

## 2022-02-05 HISTORY — PX: THYROIDECTOMY: SHX17

## 2022-02-05 LAB — BASIC METABOLIC PANEL
Anion gap: 8 (ref 5–15)
BUN: 23 mg/dL — ABNORMAL HIGH (ref 6–20)
CO2: 23 mmol/L (ref 22–32)
Calcium: 9.6 mg/dL (ref 8.9–10.3)
Chloride: 105 mmol/L (ref 98–111)
Creatinine, Ser: 0.8 mg/dL (ref 0.44–1.00)
GFR, Estimated: >60 mL/min (ref >60)
Glucose, Bld: 130 mg/dL — ABNORMAL HIGH (ref 70–99)
Potassium: 3.9 mmol/L (ref 3.5–5.1)
Sodium: 136 mmol/L (ref 135–145)

## 2022-02-05 LAB — CBC
HCT: 33.5 % — ABNORMAL LOW (ref 36.0–46.0)
Hemoglobin: 10.3 g/dL — ABNORMAL LOW (ref 12.0–15.0)
MCH: 23.1 pg — ABNORMAL LOW (ref 26.0–34.0)
MCHC: 30.7 g/dL (ref 30.0–36.0)
MCV: 75.3 fL — ABNORMAL LOW (ref 80.0–100.0)
Platelets: 420 10*3/uL — ABNORMAL HIGH (ref 150–400)
RBC: 4.45 MIL/uL (ref 3.87–5.11)
RDW: 18 % — ABNORMAL HIGH (ref 11.5–15.5)
WBC: 7.3 10*3/uL (ref 4.0–10.5)
nRBC: 0 % (ref 0.0–0.2)

## 2022-02-05 LAB — GLUCOSE, CAPILLARY: Glucose-Capillary: 133 mg/dL — ABNORMAL HIGH (ref 70–99)

## 2022-02-05 LAB — CALCIUM
Calcium: 8.9 mg/dL (ref 8.9–10.3)
Calcium: 8.6 mg/dL — ABNORMAL LOW (ref 8.9–10.3)

## 2022-02-05 LAB — POCT PREGNANCY, URINE: Preg Test, Ur: NEGATIVE

## 2022-02-05 SURGERY — THYROIDECTOMY
Anesthesia: General | Site: Neck

## 2022-02-05 MED ORDER — LIDOCAINE 2% (20 MG/ML) 5 ML SYRINGE
INTRAMUSCULAR | Status: AC
  Filled 2022-02-05: qty 5

## 2022-02-05 MED ORDER — ACETAMINOPHEN 500 MG PO TABS
ORAL_TABLET | ORAL | Status: AC
  Filled 2022-02-05: qty 2

## 2022-02-05 MED ORDER — LIDOCAINE-EPINEPHRINE 1 %-1:100000 IJ SOLN
INTRAMUSCULAR | Status: DC | PRN
  Administered 2022-02-05: 3 mL

## 2022-02-05 MED ORDER — SUCCINYLCHOLINE CHLORIDE 200 MG/10ML IV SOSY
PREFILLED_SYRINGE | INTRAVENOUS | Status: AC
  Filled 2022-02-05: qty 10

## 2022-02-05 MED ORDER — ONDANSETRON HCL 4 MG/2ML IJ SOLN: 4.0000 mg | Freq: Once | INTRAMUSCULAR | Status: DC | PRN

## 2022-02-05 MED ORDER — ONDANSETRON 4 MG PO TBDP: 4.0000 mg | ORAL_TABLET | Freq: Three times a day (TID) | ORAL | 0 refills | Status: DC | PRN

## 2022-02-05 MED ORDER — FENTANYL CITRATE (PF) 100 MCG/2ML IJ SOLN
INTRAMUSCULAR | Status: AC
  Administered 2022-02-05: 50 ug via INTRAVENOUS
  Filled 2022-02-05: qty 2

## 2022-02-05 MED ORDER — LIDOCAINE-EPINEPHRINE 1 %-1:100000 IJ SOLN
INTRAMUSCULAR | Status: AC
  Filled 2022-02-05: qty 1

## 2022-02-05 MED ORDER — LIDOCAINE 2% (20 MG/ML) 5 ML SYRINGE
INTRAMUSCULAR | Status: DC | PRN
  Administered 2022-02-05: 60 mg via INTRAVENOUS

## 2022-02-05 MED ORDER — HYDROCODONE-ACETAMINOPHEN 5-325 MG PO TABS
1.0000 | ORAL_TABLET | ORAL | Status: DC | PRN
  Administered 2022-02-05 – 2022-02-06 (×2): 1 via ORAL
  Filled 2022-02-05 (×2): qty 1

## 2022-02-05 MED ORDER — INSULIN ASPART 100 UNIT/ML IJ SOLN: 0.0000 [IU] | INTRAMUSCULAR | Status: DC | PRN

## 2022-02-05 MED ORDER — LEVOTHYROXINE SODIUM 100 MCG PO TABS
100.0000 ug | ORAL_TABLET | Freq: Every day | ORAL | Status: DC
  Administered 2022-02-06: 100 ug via ORAL
  Filled 2022-02-05: qty 1

## 2022-02-05 MED ORDER — PROPOFOL 10 MG/ML IV BOLUS
INTRAVENOUS | Status: DC | PRN
  Administered 2022-02-05: 160 mg via INTRAVENOUS
  Administered 2022-02-05: 40 mg via INTRAVENOUS

## 2022-02-05 MED ORDER — FENTANYL CITRATE (PF) 100 MCG/2ML IJ SOLN
25.0000 ug | INTRAMUSCULAR | Status: DC | PRN
  Administered 2022-02-05: 50 ug via INTRAVENOUS

## 2022-02-05 MED ORDER — HYDROXYZINE HCL 25 MG PO TABS
25.0000 mg | ORAL_TABLET | Freq: Every evening | ORAL | Status: DC | PRN
  Administered 2022-02-05: 25 mg via ORAL
  Filled 2022-02-05: qty 1

## 2022-02-05 MED ORDER — SUGAMMADEX SODIUM 200 MG/2ML IV SOLN
INTRAVENOUS | Status: DC | PRN
  Administered 2022-02-05: 122.4 mg via INTRAVENOUS

## 2022-02-05 MED ORDER — VITAMIN D (ERGOCALCIFEROL) 1.25 MG (50000 UNIT) PO CAPS
50000.0000 [IU] | ORAL_CAPSULE | ORAL | Status: DC
  Administered 2022-02-06: 50000 [IU] via ORAL
  Filled 2022-02-05: qty 1

## 2022-02-05 MED ORDER — ONDANSETRON HCL 4 MG/2ML IJ SOLN
INTRAMUSCULAR | Status: AC
Start: 2022-02-05 — End: ?
  Filled 2022-02-05: qty 2

## 2022-02-05 MED ORDER — DEXAMETHASONE SODIUM PHOSPHATE 10 MG/ML IJ SOLN
INTRAMUSCULAR | Status: DC | PRN
  Administered 2022-02-05: 10 mg via INTRAVENOUS

## 2022-02-05 MED ORDER — OXYCODONE HCL 5 MG PO TABS: 5.0000 mg | ORAL_TABLET | Freq: Once | ORAL | Status: AC | PRN

## 2022-02-05 MED ORDER — FENTANYL CITRATE (PF) 250 MCG/5ML IJ SOLN
INTRAMUSCULAR | Status: DC | PRN
  Administered 2022-02-05: 50 ug via INTRAVENOUS
  Administered 2022-02-05: 100 ug via INTRAVENOUS

## 2022-02-05 MED ORDER — CHLORHEXIDINE GLUCONATE 0.12 % MT SOLN
15.0000 mL | Freq: Once | OROMUCOSAL | Status: AC
  Administered 2022-02-05: 15 mL via OROMUCOSAL
  Filled 2022-02-05: qty 15

## 2022-02-05 MED ORDER — PROPOFOL 500 MG/50ML IV EMUL
INTRAVENOUS | Status: DC | PRN
  Administered 2022-02-05: 50 ug/kg/min via INTRAVENOUS
  Administered 2022-02-05: 75 ug/kg/min via INTRAVENOUS

## 2022-02-05 MED ORDER — FENTANYL CITRATE (PF) 250 MCG/5ML IJ SOLN
INTRAMUSCULAR | Status: AC
  Filled 2022-02-05: qty 5

## 2022-02-05 MED ORDER — SUCCINYLCHOLINE CHLORIDE 200 MG/10ML IV SOSY
PREFILLED_SYRINGE | INTRAVENOUS | Status: DC | PRN
  Administered 2022-02-05: 140 mg via INTRAVENOUS

## 2022-02-05 MED ORDER — PHENYLEPHRINE 80 MCG/ML (10ML) SYRINGE FOR IV PUSH (FOR BLOOD PRESSURE SUPPORT)
PREFILLED_SYRINGE | INTRAVENOUS | Status: DC | PRN
  Administered 2022-02-05: 160 ug via INTRAVENOUS

## 2022-02-05 MED ORDER — KETOROLAC TROMETHAMINE 30 MG/ML IJ SOLN: 30.0000 mg | Freq: Once | INTRAMUSCULAR | Status: AC

## 2022-02-05 MED ORDER — MIDAZOLAM HCL 2 MG/2ML IJ SOLN
INTRAMUSCULAR | Status: AC
  Filled 2022-02-05: qty 2

## 2022-02-05 MED ORDER — LEVOTHYROXINE SODIUM 100 MCG PO TABS: 100.0000 ug | ORAL_TABLET | Freq: Every day | ORAL | 6 refills | Status: AC

## 2022-02-05 MED ORDER — ONDANSETRON HCL 4 MG/2ML IJ SOLN: 4.0000 mg | INTRAMUSCULAR | Status: DC | PRN

## 2022-02-05 MED ORDER — MIDAZOLAM HCL 2 MG/2ML IJ SOLN
INTRAMUSCULAR | Status: DC | PRN
  Administered 2022-02-05: 2 mg via INTRAVENOUS

## 2022-02-05 MED ORDER — LACTATED RINGERS IV SOLN: INTRAVENOUS | Status: DC

## 2022-02-05 MED ORDER — ONDANSETRON HCL 4 MG/2ML IJ SOLN
INTRAMUSCULAR | Status: DC | PRN
  Administered 2022-02-05: 4 mg via INTRAVENOUS

## 2022-02-05 MED ORDER — ORAL CARE MOUTH RINSE: 15.0000 mL | Freq: Once | OROMUCOSAL | Status: AC

## 2022-02-05 MED ORDER — 0.9 % SODIUM CHLORIDE (POUR BTL) OPTIME
TOPICAL | Status: DC | PRN
  Administered 2022-02-05: 1000 mL

## 2022-02-05 MED ORDER — DEXTROSE-NACL 5-0.9 % IV SOLN: INTRAVENOUS | Status: DC

## 2022-02-05 MED ORDER — PANTOPRAZOLE SODIUM 40 MG PO TBEC
40.0000 mg | DELAYED_RELEASE_TABLET | Freq: Every day | ORAL | Status: DC
  Administered 2022-02-05 – 2022-02-06 (×2): 40 mg via ORAL
  Filled 2022-02-05 (×2): qty 1

## 2022-02-05 MED ORDER — ONDANSETRON HCL 4 MG PO TABS: 4.0000 mg | ORAL_TABLET | ORAL | Status: DC | PRN

## 2022-02-05 MED ORDER — MENTHOL 3 MG MT LOZG
1.0000 | LOZENGE | OROMUCOSAL | Status: DC | PRN
Start: 2022-02-05 — End: 2022-02-06
  Administered 2022-02-06: 3 mg via ORAL
  Filled 2022-02-05 (×2): qty 9

## 2022-02-05 MED ORDER — PHENYLEPHRINE HCL-NACL 20-0.9 MG/250ML-% IV SOLN
INTRAVENOUS | Status: DC | PRN
  Administered 2022-02-05: 20 ug/min via INTRAVENOUS

## 2022-02-05 MED ORDER — DEXAMETHASONE SODIUM PHOSPHATE 10 MG/ML IJ SOLN
INTRAMUSCULAR | Status: AC
Start: 2022-02-05 — End: ?
  Filled 2022-02-05: qty 1

## 2022-02-05 MED ORDER — IBUPROFEN 100 MG/5ML PO SUSP: 400.0000 mg | Freq: Four times a day (QID) | ORAL | Status: DC | PRN

## 2022-02-05 MED ORDER — OXYCODONE HCL 5 MG/5ML PO SOLN: 5.0000 mg | Freq: Once | ORAL | Status: AC | PRN

## 2022-02-05 MED ORDER — OXYCODONE HCL 5 MG PO TABS
ORAL_TABLET | ORAL | Status: AC
  Administered 2022-02-05: 5 mg via ORAL
  Filled 2022-02-05: qty 1

## 2022-02-05 MED ORDER — KETOROLAC TROMETHAMINE 30 MG/ML IJ SOLN
INTRAMUSCULAR | Status: AC
  Administered 2022-02-05: 30 mg via INTRAVENOUS
  Filled 2022-02-05: qty 1

## 2022-02-05 MED ORDER — ESCITALOPRAM OXALATE 20 MG PO TABS
20.0000 mg | ORAL_TABLET | Freq: Every day | ORAL | Status: DC
  Administered 2022-02-05 – 2022-02-06 (×2): 20 mg via ORAL
  Filled 2022-02-05 (×2): qty 1

## 2022-02-05 MED ORDER — ROCURONIUM BROMIDE 10 MG/ML (PF) SYRINGE
PREFILLED_SYRINGE | INTRAVENOUS | Status: DC | PRN
  Administered 2022-02-05: 50 mg via INTRAVENOUS

## 2022-02-05 MED ORDER — HYDROCODONE-ACETAMINOPHEN 7.5-325 MG PO TABS: 1.0000 | ORAL_TABLET | Freq: Four times a day (QID) | ORAL | 0 refills | Status: DC | PRN

## 2022-02-05 MED ORDER — ACETAMINOPHEN 500 MG PO TABS
1000.0000 mg | ORAL_TABLET | Freq: Once | ORAL | Status: AC
  Administered 2022-02-05: 1000 mg via ORAL

## 2022-02-05 MED ORDER — AMISULPRIDE (ANTIEMETIC) 5 MG/2ML IV SOLN: 10.0000 mg | Freq: Once | INTRAVENOUS | Status: DC | PRN

## 2022-02-05 SURGICAL SUPPLY — 44 items
ADH SKN CLS APL DERMABOND .7 (GAUZE/BANDAGES/DRESSINGS) ×1
BAG COUNTER SPONGE SURGICOUNT (BAG) ×3 IMPLANT
BAG SPNG CNTER NS LX DISP (BAG) ×1
BLADE SURG 15 STRL LF DISP TIS (BLADE) IMPLANT
BLADE SURG 15 STRL SS (BLADE)
CANISTER SUCT 3000ML PPV (MISCELLANEOUS) ×3 IMPLANT
CLEANER TIP ELECTROSURG 2X2 (MISCELLANEOUS) ×3 IMPLANT
CNTNR URN SCR LID CUP LEK RST (MISCELLANEOUS) ×2 IMPLANT
CONT SPEC 4OZ STRL OR WHT (MISCELLANEOUS) ×2
CORD BIPOLAR FORCEPS 12FT (ELECTRODE) ×3 IMPLANT
COVER SURGICAL LIGHT HANDLE (MISCELLANEOUS) ×3 IMPLANT
DERMABOND ADVANCED (GAUZE/BANDAGES/DRESSINGS) ×1
DERMABOND ADVANCED .7 DNX12 (GAUZE/BANDAGES/DRESSINGS) ×2 IMPLANT
DRAIN HEMOVAC 7FR (DRAIN) IMPLANT
DRAIN JP 10F RND RADIO (DRAIN) ×1 IMPLANT
DRAIN SNY 10 ROU (WOUND CARE) IMPLANT
DRAPE HALF SHEET 40X57 (DRAPES) IMPLANT
ELECT COATED BLADE 2.86 ST (ELECTRODE) ×3 IMPLANT
ELECT REM PT RETURN 9FT ADLT (ELECTROSURGICAL) ×2
ELECTRODE REM PT RTRN 9FT ADLT (ELECTROSURGICAL) ×2 IMPLANT
EVACUATOR SILICONE 100CC (DRAIN) ×3 IMPLANT
FORCEPS BIPOLAR SPETZLER 8 1.0 (NEUROSURGERY SUPPLIES) ×3 IMPLANT
GAUZE 4X4 16PLY ~~LOC~~+RFID DBL (SPONGE) IMPLANT
GLOVE BIO SURGEON STRL SZ 6.5 (GLOVE) IMPLANT
GLOVE ECLIPSE 7.5 STRL STRAW (GLOVE) ×3 IMPLANT
GOWN STRL REUS W/ TWL LRG LVL3 (GOWN DISPOSABLE) ×4 IMPLANT
GOWN STRL REUS W/TWL LRG LVL3 (GOWN DISPOSABLE) ×4
KIT BASIN OR (CUSTOM PROCEDURE TRAY) ×3 IMPLANT
KIT TURNOVER KIT B (KITS) ×3 IMPLANT
NDL PRECISIONGLIDE 27X1.5 (NEEDLE) ×2 IMPLANT
NEEDLE PRECISIONGLIDE 27X1.5 (NEEDLE) ×2 IMPLANT
NS IRRIG 1000ML POUR BTL (IV SOLUTION) ×3 IMPLANT
PAD ARMBOARD 7.5X6 YLW CONV (MISCELLANEOUS) ×6 IMPLANT
PENCIL FOOT CONTROL (ELECTRODE) ×3 IMPLANT
SHEARS HARMONIC 9CM CVD (BLADE) ×3 IMPLANT
STAPLER VISISTAT 35W (STAPLE) ×3 IMPLANT
SUT CHROMIC 3 0 PS 2 (SUTURE) ×2 IMPLANT
SUT CHROMIC 4 0 PS 2 18 (SUTURE) ×2 IMPLANT
SUT ETHILON 3 0 PS 1 (SUTURE) ×3 IMPLANT
SUT SILK 3 0 REEL (SUTURE) ×2 IMPLANT
SUT SILK 3 0 SH 30 (SUTURE) ×1 IMPLANT
SUT SILK 4 0 REEL (SUTURE) ×3 IMPLANT
TOWEL GREEN STERILE FF (TOWEL DISPOSABLE) ×3 IMPLANT
TRAY ENT MC OR (CUSTOM PROCEDURE TRAY) ×3 IMPLANT

## 2022-02-05 NOTE — Interval H&P Note (Signed)
History and Physical Interval Note:  02/05/2022 7:13 AM  Melanie [name]  has presented today for surgery, with the diagnosis of Thyroid mass.  The various methods of treatment have been discussed with the patient and family. After consideration of risks, benefits and other options for treatment, the patient has consented to  Procedure(s) with comments: TOTAL THYROIDECTOMY (N/A) - REQUESTS RNFA as a surgical intervention.  The patient's history has been reviewed, patient examined, no change in status, stable for surgery.  I have reviewed the patient's chart and labs.  Questions were answered to the patient's satisfaction.     Melanie [name]

## 2022-02-05 NOTE — Op Note (Signed)
OPERATIVE REPORT  DATE OF SURGERY: 02/05/2022  PATIENT:  Melanie [name],  36 y.o. female  PRE-OPERATIVE DIAGNOSIS:  Thyroid mass  POST-OPERATIVE DIAGNOSIS:  Thyroid mass  PROCEDURE:  Procedure(s): TOTAL THYROIDECTOMY  SURGEON:  Beckie Salts, MD  ASSISTANTS: RNFA  ANESTHESIA:   General   EBL: 50 ml  DRAINS: 10 French round JP  LOCAL MEDICATIONS USED: 1% Xylocaine with epinephrine  SPECIMEN: Total thyroidectomy, suture marks left lobe  COUNTS:  Correct  PROCEDURE DETAILS: The patient was taken to the operating room and placed on the operating table in the supine position. A shoulder roll was placed beneath the shoulder blades and the neck was extended. The neck was prepped and draped in a standard fashion. A low collar transverse incision was outlined with a marking pen and was incised with a #15 scalpel following local anesthetic application. Dissection was continued down through the platysma layer.  Self-retaining retractors were used throughout the case.  The midline fascia was divided.  The strap muscles were divided on both sides to facilitate exposure due to the size of the left-sided mass.  The left lobe was dissected first.  The superior pole was brought down and the superior vasculature was separately identified ligated using the harmonic dissector.  Larger vessels were ligated with 4-0 silk ties.  The recurrent nerve was identified and preserved all the way into its entry into the larynx.  A superior parathyroid was identified and preserved with its blood supply.  The middle thyroid vein was ligated between clamps and divided.  The inferior vasculature was dissected also using the harmonic dissector.  Berry's ligament was divided as the lobe was dissected off of the trachea.  There were no anterior compartment nodes palpable or visible.  The left lobe was quite large with the 4 cm mass identified by palpation within the lobe.  The right side was then dissected in a  similar fashion.  The superior vasculature was dissected first.  The recurrent nerve was identified and preserved into its entry into the larynx.  A parathyroid was identified on the right side at about the level of the middle thyroid vein and preserved with its blood supply.  There is ligament was divided and the entire specimen was delivered and sent for pathologic evaluation.  There were no nodes palpable or visible in the anterior compartment.    Bipolar cautery was used for hemostasis.  The wound was irrigated with saline.  The strap muscles were reapproximated both sides using interrupted 4-0 chromic.  The midline fascia was reapproximated in a similar fashion.  The platysma layer was reapproximated also with 4-0 chromic.  A running subcuticular closure was accomplished with 4-0 chromic.  Dermabond was used on the skin.  The drain was exited through a separate stab incision inferiorly and secured in place with a silk suture. The drain was charged. The patient was awakened, extubated and transferred to recovery in stable condition.   PATIENT DISPOSITION:  To PACU, stable

## 2022-02-05 NOTE — Transfer of Care (Signed)
Immediate Anesthesia Transfer of Care Note  Patient: Melanie [name]  Procedure(s) Performed: TOTAL THYROIDECTOMY (Neck)  Patient Location: PACU  Anesthesia Type:General  Level of Consciousness: awake, alert  and oriented  Airway & Oxygen Therapy: Patient Spontanous Breathing  Post-op Assessment: Report given to RN and Post -op Vital signs reviewed and stable  Post vital signs: Reviewed and stable  Last Vitals:  Vitals Value Taken Time  BP 127/83 02/05/22 0907  Temp    Pulse 66 02/05/22 0911  Resp 12 02/05/22 0911  SpO2 100 % 02/05/22 0911  Vitals shown include unvalidated device data.  Last Pain:  Vitals:   02/05/22 0618  TempSrc:   PainSc: 0-No pain         Complications: No notable events documented.

## 2022-02-05 NOTE — Anesthesia Postprocedure Evaluation (Signed)
Anesthesia Post Note  Patient: Melanie [name]  Procedure(s) Performed: TOTAL THYROIDECTOMY (Neck)     Patient location during evaluation: PACU Anesthesia Type: General Level of consciousness: awake and alert Pain management: pain level controlled Vital Signs Assessment: post-procedure vital signs reviewed and stable Respiratory status: spontaneous breathing, nonlabored ventilation and respiratory function stable Cardiovascular status: blood pressure returned to baseline and stable Postop Assessment: no apparent nausea or vomiting Anesthetic complications: no   No notable events documented.  Last Vitals:  Vitals:   02/05/22 0910 02/05/22 0915  BP: 127/83 127/89  Pulse: 66 67  Resp: 17 13  Temp: 36.7 C   SpO2: 99% 100%    Last Pain:  Vitals:   02/05/22 0945  TempSrc:   PainSc: 6                  Lidia Collum

## 2022-02-05 NOTE — Progress Notes (Signed)
ENT Post Operative Note  Subjective: Patient seen and examined at bedside. Reports pain is controlled. Tolerating a diet without issue.   Vitals:   02/05/22 1229 02/05/22 1545  BP: 121/68 119/77  Pulse: 69 84  Resp: 16 16  Temp: 97.7 F (36.5 C) 98.5 F (36.9 C)  SpO2: 100% 100%     OBJECTIVE  Gen: alert, cooperative, appropriate Head/ENT: EOMI, mucus membranes moist and pink, conjunctiva clear Midline neck incision C/D/I with dermabond intact. Neck soft, with no evidence of seroma or hematoma.JP drain exiting inferior neck with serosanguinous drainage. Respiratory: Voice without dysphonia. Non-labored breathing, no accessory muscle use, good O2 saturations on room air Neuro: CN II-XII grossly intact  ASSESS/ PLAN  [name] is a 36 y.o. female who is POD 0 from Total thyroidectomy.  -Continue observation -Continue JP drain to bulb suction. Empty, record output, and recharge every 6 hours. -Continue MIVF- will saline lock when tolerating PO intake -Encourage IS use, ambulation to tolerance with assist -Pain control -Calcium normal  Thank you for allowing me to participate in the care of this patient. Please do not hesitate to contact me with any questions or concerns.   Melanie [name], [facility] Cell: [phone]

## 2022-02-05 NOTE — Discharge Instructions (Signed)
You may shower and use soap and water. Do not use any creams, oils or ointment. ° °

## 2022-02-05 NOTE — Anesthesia Procedure Notes (Signed)
Procedure Name: Intubation Date/Time: 02/05/2022 7:40 AM  Performed by: Minerva Ends, CRNAPre-anesthesia Checklist: Patient identified, Emergency Drugs available, Suction available and Patient being monitored Patient Re-evaluated:Patient Re-evaluated prior to induction Oxygen Delivery Method: Circle system utilized Preoxygenation: Pre-oxygenation with 100% oxygen Induction Type: IV induction Ventilation: Mask ventilation without difficulty Laryngoscope Size: Mac and 3 Grade View: Grade II Tube type: Oral Number of attempts: 1 Airway Equipment and Method: Stylet and Oral airway Placement Confirmation: ETT inserted through vocal cords under direct vision, positive ETCO2 and breath sounds checked- equal and bilateral Secured at: 22 cm Tube secured with: Tape Dental Injury: Teeth and Oropharynx as per pre-operative assessment

## 2022-02-06 ENCOUNTER — Encounter (HOSPITAL_COMMUNITY): Payer: Self-pay | Admitting: Otolaryngology

## 2022-02-06 DIAGNOSIS — E042 Nontoxic multinodular goiter: Secondary | ICD-10-CM | POA: Diagnosis not present

## 2022-02-06 LAB — CALCIUM
Calcium: 8.2 mg/dL — ABNORMAL LOW (ref 8.9–10.3)
Calcium: 8.3 mg/dL — ABNORMAL LOW (ref 8.9–10.3)

## 2022-02-06 MED ORDER — OYSTER SHELL CALCIUM/D3 500-5 MG-MCG PO TABS: 2.0000 | ORAL_TABLET | Freq: Two times a day (BID) | ORAL | 0 refills | Status: AC

## 2022-02-06 NOTE — Discharge Summary (Signed)
Physician Discharge Summary  Patient ID: Melanie [name] MRN: [id number] DOB/AGE: May 25, 1986 36 y.o.  Admit date: 02/05/2022 Discharge date: 02/06/2022  Admission Diagnoses:  Principal Problem:   Thyroid cancer [facility])   Discharge Diagnoses:  Same  Surgeries: Procedure(s): TOTAL THYROIDECTOMY on 02/05/2022   Consultants: None  Discharged Condition: Improved  Hospital Course: Melanie [name] is an 36 y.o. female who was admitted 02/05/2022 and found to have a diagnosis of Thyroid cancer ([place]).  They were brought to the operating room on 02/05/2022 and underwent the above named procedures.  Patient was admitted for routine overnight observation.  On postoperative day #1, she was noted to have adequate oral intake, pain was controlled and drain output was minimal.  Patient denies any perioral numbness or tingling or tingling of the extremities.  Drain removed at bedside and patient was deemed stable for discharge.  Postoperative instructions were reviewed prior to discharge.  Physical Exam: General: Awake and alert, no acute distress Neck: Midline neck incision clean, dry, intact with Dermabond overlying incision.  No signs of hematoma or seroma.  JP drain exiting neck inferiorly with minimal serosanguineous drainage Respiratory: Respiratory effort is normal.  Recent vital signs:  Vitals:   02/06/22 0521 02/06/22 0818  BP: 126/78 127/77  Pulse: 66 75  Resp: 17 16  Temp: 97.6 F (36.4 C) 98.7 F (37.1 C)  SpO2: 98% 99%    Recent laboratory studies:  Results for orders placed or performed during the hospital encounter of 02/05/22  Glucose, capillary  Result Value Ref Range   Glucose-Capillary 133 (H) 70 - 99 mg/dL  Basic metabolic panel per protocol  Result Value Ref Range   Sodium 136 135 - 145 mmol/L   Potassium 3.9 3.5 - 5.1 mmol/L   Chloride 105 98 - 111 mmol/L   CO2 23 22 - 32 mmol/L   Glucose, Bld 130 (H) 70 - 99 mg/dL   BUN 23 (H) 6 - 20 mg/dL   Creatinine, Ser  0.80 0.44 - 1.00 mg/dL   Calcium 9.6 8.9 - 10.3 mg/dL   GFR, Estimated >60 >60 mL/min   Anion gap 8 5 - 15  CBC per protocol  Result Value Ref Range   WBC 7.3 4.0 - 10.5 K/uL   RBC 4.45 3.87 - 5.11 MIL/uL   Hemoglobin 10.3 (L) 12.0 - 15.0 g/dL   HCT 33.5 (L) 36.0 - 46.0 %   MCV 75.3 (L) 80.0 - 100.0 fL   MCH 23.1 (L) 26.0 - 34.0 pg   MCHC 30.7 30.0 - 36.0 g/dL   RDW 18.0 (H) 11.5 - 15.5 %   Platelets 420 (H) 150 - 400 K/uL   nRBC 0.0 0.0 - 0.2 %  Calcium  Result Value Ref Range   Calcium 8.9 8.9 - 10.3 mg/dL  Calcium  Result Value Ref Range   Calcium 8.6 (L) 8.9 - 10.3 mg/dL  Calcium  Result Value Ref Range   Calcium 8.2 (L) 8.9 - 10.3 mg/dL  Pregnancy, urine POC  Result Value Ref Range   Preg Test, Ur NEGATIVE NEGATIVE    Discharge Medications:   Allergies as of 02/06/2022   No Known Allergies      Medication List     TAKE these medications    calcium-vitamin D 500-5 MG-MCG tablet Commonly known as: OSCAL WITH D Take 2 tablets by mouth 2 (two) times daily.   escitalopram 20 MG tablet Commonly known as: LEXAPRO Take 20 mg by mouth daily.   HYDROcodone-acetaminophen 7.5-325  MG tablet Commonly known as: Norco Take 1 tablet by mouth every 6 (six) hours as needed for moderate pain.   hydrOXYzine 25 MG tablet Commonly known as: ATARAX Take 25 mg by mouth at bedtime as needed (sleep).   levothyroxine 100 MCG tablet Commonly known as: Synthroid Take 1 tablet (100 mcg total) by mouth daily.   omeprazole 20 MG capsule Commonly known as: PRILOSEC Take 20 mg by mouth 2 (two) times daily.   ondansetron 4 MG disintegrating tablet Commonly known as: ZOFRAN-ODT Take 1 tablet (4 mg total) by mouth every 8 (eight) hours as needed for nausea or vomiting.   Vitamin D (Ergocalciferol) 1.25 MG (50000 UNIT) Caps capsule Commonly known as: DRISDOL Take 50,000 Units by mouth once a week.        Diagnostic Studies: No results found.  Disposition: Discharge  disposition: 01-Home or Self Care            Signed: Davit Vassar A Nolita Kutter 02/06/2022, 10:10 AM

## 2022-02-06 NOTE — Progress Notes (Signed)
IV removed and guaze dressing applied. AVS given to patient and all discharge instructions and medications were reviewed with the patient and patient's mother. Follow up appointments and new prescriptions reviewed. All documented belongings were accounted for and present at bedside. Patient states understanding and teach back achieved.

## 2022-02-10 LAB — SURGICAL PATHOLOGY

## 2022-02-20 ENCOUNTER — Encounter: Payer: Self-pay | Admitting: *Deleted

## 2022-06-02 ENCOUNTER — Other Ambulatory Visit: Payer: Self-pay | Admitting: Internal Medicine

## 2022-08-22 ENCOUNTER — Encounter (HOSPITAL_COMMUNITY): Payer: Self-pay

## 2022-08-22 ENCOUNTER — Inpatient Hospital Stay (HOSPITAL_COMMUNITY)
Admission: EM | Admit: 2022-08-22 | Discharge: 2022-08-24 | DRG: 386 | Disposition: A | Discharge status: Home / Self Care | Payer: Medicaid Other | Attending: Internal Medicine | Admitting: Internal Medicine

## 2022-08-22 ENCOUNTER — Emergency Department (HOSPITAL_COMMUNITY): Payer: Medicaid Other

## 2022-08-22 DIAGNOSIS — Z85850 Personal history of malignant neoplasm of thyroid: Secondary | ICD-10-CM

## 2022-08-22 DIAGNOSIS — Z833 Family history of diabetes mellitus: Secondary | ICD-10-CM | POA: Diagnosis not present

## 2022-08-22 DIAGNOSIS — R112 Nausea with vomiting, unspecified: Secondary | ICD-10-CM | POA: Diagnosis present

## 2022-08-22 DIAGNOSIS — E1165 Type 2 diabetes mellitus with hyperglycemia: Secondary | ICD-10-CM | POA: Diagnosis present

## 2022-08-22 DIAGNOSIS — D259 Leiomyoma of uterus, unspecified: Secondary | ICD-10-CM | POA: Diagnosis present

## 2022-08-22 DIAGNOSIS — Z1152 Encounter for screening for COVID-19: Secondary | ICD-10-CM | POA: Diagnosis not present

## 2022-08-22 DIAGNOSIS — Z79899 Other long term (current) drug therapy: Secondary | ICD-10-CM

## 2022-08-22 DIAGNOSIS — K5090 Crohn's disease, unspecified, without complications: Secondary | ICD-10-CM | POA: Diagnosis present

## 2022-08-22 DIAGNOSIS — Z9851 Tubal ligation status: Secondary | ICD-10-CM

## 2022-08-22 DIAGNOSIS — F32A Depression, unspecified: Secondary | ICD-10-CM | POA: Diagnosis present

## 2022-08-22 DIAGNOSIS — E876 Hypokalemia: Principal | ICD-10-CM | POA: Diagnosis present

## 2022-08-22 DIAGNOSIS — Z8249 Family history of ischemic heart disease and other diseases of the circulatory system: Secondary | ICD-10-CM | POA: Diagnosis not present

## 2022-08-22 DIAGNOSIS — K219 Gastro-esophageal reflux disease without esophagitis: Secondary | ICD-10-CM | POA: Diagnosis present

## 2022-08-22 DIAGNOSIS — E860 Dehydration: Secondary | ICD-10-CM | POA: Diagnosis present

## 2022-08-22 DIAGNOSIS — Z811 Family history of alcohol abuse and dependence: Secondary | ICD-10-CM

## 2022-08-22 DIAGNOSIS — Z8632 Personal history of gestational diabetes: Secondary | ICD-10-CM | POA: Diagnosis not present

## 2022-08-22 DIAGNOSIS — D509 Iron deficiency anemia, unspecified: Secondary | ICD-10-CM | POA: Diagnosis present

## 2022-08-22 DIAGNOSIS — Z8719 Personal history of other diseases of the digestive system: Secondary | ICD-10-CM | POA: Diagnosis not present

## 2022-08-22 DIAGNOSIS — Z79890 Hormone replacement therapy (postmenopausal): Secondary | ICD-10-CM

## 2022-08-22 DIAGNOSIS — N920 Excessive and frequent menstruation with regular cycle: Secondary | ICD-10-CM | POA: Diagnosis present

## 2022-08-22 DIAGNOSIS — E872 Acidosis, unspecified: Secondary | ICD-10-CM | POA: Diagnosis present

## 2022-08-22 DIAGNOSIS — K529 Noninfective gastroenteritis and colitis, unspecified: Secondary | ICD-10-CM | POA: Diagnosis present

## 2022-08-22 DIAGNOSIS — E890 Postprocedural hypothyroidism: Secondary | ICD-10-CM | POA: Diagnosis present

## 2022-08-22 DIAGNOSIS — F909 Attention-deficit hyperactivity disorder, unspecified type: Secondary | ICD-10-CM | POA: Diagnosis present

## 2022-08-22 DIAGNOSIS — F419 Anxiety disorder, unspecified: Secondary | ICD-10-CM | POA: Diagnosis present

## 2022-08-22 DIAGNOSIS — K633 Ulcer of intestine: Secondary | ICD-10-CM | POA: Diagnosis present

## 2022-08-22 DIAGNOSIS — D62 Acute posthemorrhagic anemia: Secondary | ICD-10-CM | POA: Diagnosis present

## 2022-08-22 DIAGNOSIS — R197 Diarrhea, unspecified: Secondary | ICD-10-CM | POA: Diagnosis not present

## 2022-08-22 LAB — CBC WITH DIFFERENTIAL/PLATELET
Abs Immature Granulocytes: 0.03 10*3/uL (ref 0.00–0.07)
Basophils Absolute: 0.1 10*3/uL (ref 0.0–0.1)
Basophils Relative: 1 %
Eosinophils Absolute: 0.1 10*3/uL (ref 0.0–0.5)
Eosinophils Relative: 1 %
HCT: 34.1 % — ABNORMAL LOW (ref 36.0–46.0)
Hemoglobin: 9.6 g/dL — ABNORMAL LOW (ref 12.0–15.0)
Immature Granulocytes: 0 %
Lymphocytes Relative: 25 %
Lymphs Abs: 2.2 10*3/uL (ref 0.7–4.0)
MCH: 22 pg — ABNORMAL LOW (ref 26.0–34.0)
MCHC: 28.2 g/dL — ABNORMAL LOW (ref 30.0–36.0)
MCV: 78 fL — ABNORMAL LOW (ref 80.0–100.0)
Monocytes Absolute: 0.8 10*3/uL (ref 0.1–1.0)
Monocytes Relative: 9 %
Neutro Abs: 5.6 10*3/uL (ref 1.7–7.7)
Neutrophils Relative %: 64 %
Platelets: 498 10*3/uL — ABNORMAL HIGH (ref 150–400)
RBC: 4.37 MIL/uL (ref 3.87–5.11)
RDW: 19.2 % — ABNORMAL HIGH (ref 11.5–15.5)
WBC: 8.7 10*3/uL (ref 4.0–10.5)
nRBC: 0 % (ref 0.0–0.2)

## 2022-08-22 LAB — COMPREHENSIVE METABOLIC PANEL
ALT: 14 U/L (ref 0–44)
AST: 23 U/L (ref 15–41)
Albumin: 3.9 g/dL (ref 3.5–5.0)
Alkaline Phosphatase: 74 U/L (ref 38–126)
Anion gap: 19 — ABNORMAL HIGH (ref 5–15)
BUN: 10 mg/dL (ref 6–20)
CO2: 16 mmol/L — ABNORMAL LOW (ref 22–32)
Calcium: 8.9 mg/dL (ref 8.9–10.3)
Chloride: 104 mmol/L (ref 98–111)
Creatinine, Ser: 0.88 mg/dL (ref 0.44–1.00)
GFR, Estimated: >60 mL/min (ref >60)
Glucose, Bld: 250 mg/dL — ABNORMAL HIGH (ref 70–99)
Potassium: 2.4 mmol/L — CL (ref 3.5–5.1)
Sodium: 139 mmol/L (ref 135–145)
Total Bilirubin: 0.3 mg/dL (ref 0.3–1.2)
Total Protein: 8 g/dL (ref 6.5–8.1)

## 2022-08-22 LAB — CBG MONITORING, ED: Glucose-Capillary: 185 mg/dL — ABNORMAL HIGH (ref 70–99)

## 2022-08-22 LAB — MAGNESIUM: Magnesium: 1.4 mg/dL — ABNORMAL LOW (ref 1.7–2.4)

## 2022-08-22 LAB — LACTIC ACID, PLASMA
Lactic Acid, Venous: 5.3 mmol/L (ref 0.5–1.9)
Lactic Acid, Venous: 2.7 mmol/L (ref 0.5–1.9)

## 2022-08-22 LAB — HCG, QUANTITATIVE, PREGNANCY: hCG, Beta Chain, Quant, S: <1 m[IU]/mL (ref <5)

## 2022-08-22 LAB — RESP PANEL BY RT-PCR (RSV, FLU A&B, COVID)  RVPGX2
Influenza A by PCR: NEGATIVE
Influenza B by PCR: NEGATIVE
Resp Syncytial Virus by PCR: NEGATIVE
SARS Coronavirus 2 by RT PCR: NEGATIVE

## 2022-08-22 LAB — LIPASE, BLOOD: Lipase: 38 U/L (ref 11–51)

## 2022-08-22 MED ORDER — METOCLOPRAMIDE HCL 5 MG/ML IJ SOLN
10.0000 mg | Freq: Once | INTRAMUSCULAR | Status: AC
  Administered 2022-08-22: 10 mg via INTRAVENOUS
  Filled 2022-08-22: qty 2

## 2022-08-22 MED ORDER — LACTATED RINGERS IV BOLUS
1000.0000 mL | Freq: Once | INTRAVENOUS | Status: AC
  Administered 2022-08-22: 1000 mL via INTRAVENOUS

## 2022-08-22 MED ORDER — POTASSIUM CHLORIDE CRYS ER 20 MEQ PO TBCR
40.0000 meq | EXTENDED_RELEASE_TABLET | Freq: Once | ORAL | Status: DC
  Filled 2022-08-22: qty 2

## 2022-08-22 MED ORDER — POTASSIUM CHLORIDE 10 MEQ/100ML IV SOLN
10.0000 meq | INTRAVENOUS | Status: AC
  Administered 2022-08-22 – 2022-08-23 (×2): 10 meq via INTRAVENOUS
  Filled 2022-08-22 (×2): qty 100

## 2022-08-22 MED ORDER — HYDROMORPHONE HCL 1 MG/ML IJ SOLN
1.0000 mg | Freq: Once | INTRAMUSCULAR | Status: AC
  Administered 2022-08-22: 1 mg via INTRAVENOUS
  Filled 2022-08-22: qty 1

## 2022-08-22 MED ORDER — IOHEXOL 300 MG/ML  SOLN
100.0000 mL | Freq: Once | INTRAMUSCULAR | Status: AC | PRN
  Administered 2022-08-22: 100 mL via INTRAVENOUS

## 2022-08-22 NOTE — ED Triage Notes (Signed)
States that she is having nausea and vomiting for the last hour. Denies pain. Hx. Of Chron's .

## 2022-08-22 NOTE — ED Provider Notes (Signed)
[place] EMERGENCY DEPARTMENT AT [facility] Provider Note   CSN: [id number] Arrival date & time: 08/22/22  [id number]     History  Chief Complaint  Patient presents with   Nausea   Emesis    Melanie [name] is a 37 y.o. female.  HPI 37 year old female with a history of Crohn's disease and thyroid cancer status post thyroidectomy presents with vomiting and diarrhea.  Symptoms started tonight.  She went to the bathroom to have a bowel movement and then noted multiple episodes of emesis.  No blood in either the stool or the emesis.  She had a couple diarrheal bowel movements.  She has bilateral abdominal pain, worse in the left lower quadrant.  This feels like when she has had issues with Crohn's before.  She has not checked her temperature but has felt very hot. She was diaphoretic in triage.  Home Medications Prior to Admission medications   Medication Sig Start Date End Date Taking? Authorizing Provider  escitalopram (LEXAPRO) 20 MG tablet Take 20 mg by mouth daily. 01/14/22  Yes [provider]  hydrOXYzine (ATARAX) 25 MG tablet Take 25 mg by mouth at bedtime as needed (sleep). 06/26/21  Yes [provider]  Vitamin D, Ergocalciferol, (DRISDOL) 1.25 MG (50000 UNIT) CAPS capsule Take 50,000 Units by mouth once a week. 04/19/20  Yes [provider]  calcium-vitamin D (OSCAL WITH D) 500-5 MG-MCG tablet Take 2 tablets by mouth 2 (two) times daily. 02/06/22 03/08/22  Skotnicki, Meghan A, DO  HYDROcodone-acetaminophen (NORCO) 7.5-325 MG tablet Take 1 tablet by mouth every 6 (six) hours as needed for moderate pain. 02/05/22   Izora Gala, MD  levothyroxine (SYNTHROID) 100 MCG tablet Take 1 tablet (100 mcg total) by mouth daily. 02/05/22   Izora Gala, MD  omeprazole (PRILOSEC) 20 MG capsule Take 20 mg by mouth 2 (two) times daily. 07/07/21   [provider]  ondansetron (ZOFRAN-ODT) 4 MG disintegrating tablet Take 1 tablet (4 mg total) by mouth every  8 (eight) hours as needed for nausea or vomiting. 02/05/22   Izora Gala, MD      Allergies    Patient has no known allergies.    Review of Systems   Review of Systems  Respiratory:  Negative for shortness of breath.   Cardiovascular:  Negative for chest pain.  Gastrointestinal:  Positive for abdominal pain, diarrhea, nausea and vomiting.    Physical Exam Updated Vital Signs BP 109/69   Pulse 60   Temp 98 F (36.7 C) (Oral)   Resp (!) 24   Ht 5' 4"$  (1.626 m)   Wt 61.2 kg   SpO2 99%   BMI 23.17 kg/m  Physical Exam Vitals and nursing note reviewed.  Constitutional:      Appearance: She is well-developed.  HENT:     Head: Normocephalic and atraumatic.  Cardiovascular:     Rate and Rhythm: Regular rhythm. Bradycardia present.     Heart sounds: Normal heart sounds.  Pulmonary:     Effort: Pulmonary effort is normal.     Breath sounds: Normal breath sounds.  Abdominal:     Palpations: Abdomen is soft.     Tenderness: There is abdominal tenderness (diffuse, generalized, worst in LLQ).  Skin:    General: Skin is warm and dry.  Neurological:     Mental Status: She is alert.     ED Results / Procedures / Treatments   Labs (all labs ordered are listed, but only abnormal results are  displayed) Labs Reviewed  CBC WITH DIFFERENTIAL/PLATELET - Abnormal; Notable for the following components:      Result Value   Hemoglobin 9.6 (*)    HCT 34.1 (*)    MCV 78.0 (*)    MCH 22.0 (*)    MCHC 28.2 (*)    RDW 19.2 (*)    Platelets 498 (*)    All other components within normal limits  COMPREHENSIVE METABOLIC PANEL - Abnormal; Notable for the following components:   Potassium 2.4 (*)    CO2 16 (*)    Glucose, Bld 250 (*)    Anion gap 19 (*)    All other components within normal limits  LACTIC ACID, PLASMA - Abnormal; Notable for the following components:   Lactic Acid, Venous 5.3 (*)    All other components within normal limits  CBG MONITORING, ED - Abnormal; Notable for  the following components:   Glucose-Capillary 185 (*)    All other components within normal limits  RESP PANEL BY RT-PCR (RSV, FLU A&B, COVID)  RVPGX2  HCG, QUANTITATIVE, PREGNANCY  LIPASE, BLOOD  LACTIC ACID, PLASMA  URINALYSIS, ROUTINE W REFLEX MICROSCOPIC  MAGNESIUM  BLOOD GAS, VENOUS    EKG EKG Interpretation  Date/Time:  Sunday August 22 2022 22:01:05 EST Ventricular Rate:  47 PR Interval:  121 QRS Duration: 99 QT Interval:  534 QTC Calculation: 473 R Axis:   77 Text Interpretation: Sinus atrial bradycardia Borderline T wave abnormalities Baseline wander in lead(s) I II aVR Confirmed by Sherwood Gambler [phone]) on 08/22/2022 10:03:34 PM  Radiology CT ABDOMEN PELVIS W CONTRAST  Result Date: 08/22/2022 CLINICAL DATA:  Crohn's exacerbation. EXAM: CT ABDOMEN AND PELVIS WITH CONTRAST TECHNIQUE: Multidetector CT imaging of the abdomen and pelvis was performed using the standard protocol following bolus administration of intravenous contrast. RADIATION DOSE REDUCTION: This exam was performed according to the departmental dose-optimization program which includes automated exposure control, adjustment of the mA and/or kV according to patient size and/or use of iterative reconstruction technique. CONTRAST:  175m OMNIPAQUE IOHEXOL 300 MG/ML  SOLN COMPARISON:  CT abdomen pelvis dated 04/20/2020. FINDINGS: Lower chest: The visualized lung bases are clear. No intra-abdominal free air.  Small free fluid in the pelvis. Hepatobiliary: Several subcentimeter hepatic hypodense lesions are too small to characterize. No biliary dilatation. Small gallstone. No pericholecystic fluid or evidence of acute cholecystitis by CT. Pancreas: Unremarkable. No pancreatic ductal dilatation or surrounding inflammatory changes. Spleen: Normal in size without focal abnormality. Adrenals/Urinary Tract: Mild haziness of the adrenal gland similar to prior CT. There is no hydronephrosis on either side. The visualized ureters  and urinary bladder appear unremarkable. Stomach/Bowel: There is mild diffuse thickened appearance of the colon with submucosal fat deposit. Findings may be partly related to underdistention or sequela of chronic inflammation. Mild colitis is not excluded clinical correlation is recommended. No definite inflammatory changes of the small bowel. There is no bowel obstruction. The appendix is normal. Vascular/Lymphatic: The abdominal aorta and IVC are unremarkable. No portal venous gas. There is no adenopathy. Reproductive: The uterus is anteverted. Multiple uterine fibroids noted. A tampon is noted in the vagina. No adnexal masses. Other: Small fat containing umbilical hernia. Musculoskeletal: No acute or significant osseous findings. IMPRESSION: 1. Underdistention of the colon or sequela of chronic inflammation. Mild colitis is not excluded clinical correlation is recommended. No bowel obstruction. Normal appendix. 2. Cholelithiasis. 3. Uterine fibroids. Electronically Signed   By: AAnner CreteM.D.   On: 08/22/2022 21:48    Procedures .Critical  Care  Performed by: Sherwood Gambler, MD Authorized by: Sherwood Gambler, MD   Critical care provider statement:    Critical care time (minutes):  40   Critical care time was exclusive of:  Separately billable procedures and treating other patients   Critical care was necessary to treat or prevent imminent or life-threatening deterioration of the following conditions:  Metabolic crisis   Critical care was time spent personally by me on the following activities:  Development of treatment plan with patient or surrogate, discussions with consultants, evaluation of patient's response to treatment, examination of patient, ordering and review of laboratory studies, ordering and review of radiographic studies, ordering and performing treatments and interventions, pulse oximetry, re-evaluation of patient's condition and review of old charts     Medications Ordered in  ED Medications  potassium chloride 10 mEq in 100 mL IVPB (10 mEq Intravenous New Bag/Given 08/22/22 2223)  potassium chloride SA (KLOR-CON M) CR tablet 40 mEq (has no administration in time range)  lactated ringers bolus 1,000 mL (0 mLs Intravenous Stopped 08/22/22 2220)  metoCLOPramide (REGLAN) injection 10 mg (10 mg Intravenous Given 08/22/22 2115)  HYDROmorphone (DILAUDID) injection 1 mg (1 mg Intravenous Given 08/22/22 2116)  lactated ringers bolus 1,000 mL (1,000 mLs Intravenous New Bag/Given 08/22/22 2221)  iohexol (OMNIPAQUE) 300 MG/ML solution 100 mL (100 mLs Intravenous Contrast Given 08/22/22 2127)    ED Course/ Medical Decision Making/ A&P                             Medical Decision Making Amount and/or Complexity of Data Reviewed Labs: ordered.    Details: Chronic anemia, unchanged. K 2.4, anion gap acidosis with lactate of 5.  Radiology: independent interpretation performed.    Details: No SBO or obvious diverticulitis on CT ECG/medicine tests: independent interpretation performed.    Details: Sinus bradycardia, no acute ischemia  Risk Prescription drug management. Decision regarding hospitalization.   37 year old female presents with acute vomiting, diarrhea and abdominal pain.  She is mildly bradycardic but otherwise stable.  Was given IV pain control with Dilaudid, nausea control with Reglan, and IV fluids.  Surprisingly her lactate is 5.  She is not hypotensive.  She was given a second liter and this will need to be rechecked but she has remained with a normal blood pressure.  Her WBC is normal.  Her CT images have been viewed/interpreted by myself and there is no evidence of obvious acute infection.  There is colitis versus underdistention.  No clear sign of Crohn's disease.  I discussed with Dr. Carlean Purl given her reported history of Crohn's disease.  For now he suggest supportive care based on the workup and GI will see in the morning.  We have started IV and oral potassium  replacement.  Discussed with Dr. Nevada Crane, who will admit.        Final Clinical Impression(s) / ED Diagnoses Final diagnoses:  Hypokalemia  Lactic acidosis    Rx / DC Orders ED Discharge Orders     None         Sherwood Gambler, MD 08/22/22 2310

## 2022-08-22 NOTE — ED Provider Triage Note (Signed)
Emergency Medicine Provider Triage Evaluation Note  SHEKIRA KRATZER , a 36 y.o. female  was evaluated in triage.  Pt complains of nausea and vomiting since prior to arrival. Denies any abdominal pain. Reports normal BM. No fevers, but reports chills. She smokes MJ three times a day.  Review of Systems  Positive:  Negative:   Physical Exam  BP (!) 168/99 (BP Location: Left Arm)   Pulse (!) 47   Temp 98 F (36.7 C) (Oral)   Resp 20   Ht 5' 4"$  (1.626 m)   Wt 61.2 kg   SpO2 100%   BMI 23.17 kg/m  Gen:   Awake extremely diaphoretic, but will not take off her sweatshirt or heavy jacket Resp:  Normal effort  MSK:   Moves extremities without difficulty  Other:    Medical Decision Making  Medically screening exam initiated at 7:44 PM.  Appropriate orders placed.  OTTO EADER was informed that the remainder of the evaluation will be completed by another provider, this initial triage assessment does not replace that evaluation, and the importance of remaining in the ED until their evaluation is complete.  CT and labs ordered   Sherrell Puller, Hershal Coria 08/22/22 [id number]

## 2022-08-23 ENCOUNTER — Encounter (HOSPITAL_COMMUNITY): Payer: Self-pay | Admitting: Internal Medicine

## 2022-08-23 ENCOUNTER — Other Ambulatory Visit: Payer: Self-pay

## 2022-08-23 DIAGNOSIS — R197 Diarrhea, unspecified: Secondary | ICD-10-CM

## 2022-08-23 DIAGNOSIS — Z8719 Personal history of other diseases of the digestive system: Secondary | ICD-10-CM

## 2022-08-23 DIAGNOSIS — R112 Nausea with vomiting, unspecified: Secondary | ICD-10-CM

## 2022-08-23 DIAGNOSIS — D509 Iron deficiency anemia, unspecified: Secondary | ICD-10-CM

## 2022-08-23 DIAGNOSIS — K529 Noninfective gastroenteritis and colitis, unspecified: Secondary | ICD-10-CM | POA: Diagnosis not present

## 2022-08-23 LAB — COMPREHENSIVE METABOLIC PANEL
ALT: 14 U/L (ref 0–44)
AST: 29 U/L (ref 15–41)
Albumin: 3.9 g/dL (ref 3.5–5.0)
Alkaline Phosphatase: 74 U/L (ref 38–126)
Anion gap: 11 (ref 5–15)
BUN: 6 mg/dL (ref 6–20)
CO2: 25 mmol/L (ref 22–32)
Calcium: 8.8 mg/dL — ABNORMAL LOW (ref 8.9–10.3)
Chloride: 100 mmol/L (ref 98–111)
Creatinine, Ser: 0.73 mg/dL (ref 0.44–1.00)
GFR, Estimated: >60 mL/min (ref >60)
Glucose, Bld: 184 mg/dL — ABNORMAL HIGH (ref 70–99)
Potassium: 2.7 mmol/L — CL (ref 3.5–5.1)
Sodium: 136 mmol/L (ref 135–145)
Total Bilirubin: 0.6 mg/dL (ref 0.3–1.2)
Total Protein: 8 g/dL (ref 6.5–8.1)

## 2022-08-23 LAB — VITAMIN B12: Vitamin B-12: 742 pg/mL (ref 180–914)

## 2022-08-23 LAB — BASIC METABOLIC PANEL
Anion gap: 14 (ref 5–15)
BUN: <5 mg/dL — ABNORMAL LOW (ref 6–20)
CO2: 21 mmol/L — ABNORMAL LOW (ref 22–32)
Calcium: 9.3 mg/dL (ref 8.9–10.3)
Chloride: 102 mmol/L (ref 98–111)
Creatinine, Ser: 0.91 mg/dL (ref 0.44–1.00)
GFR, Estimated: >60 mL/min (ref >60)
Glucose, Bld: 155 mg/dL — ABNORMAL HIGH (ref 70–99)
Potassium: 3.5 mmol/L (ref 3.5–5.1)
Sodium: 137 mmol/L (ref 135–145)

## 2022-08-23 LAB — GLUCOSE, CAPILLARY
Glucose-Capillary: 153 mg/dL — ABNORMAL HIGH (ref 70–99)
Glucose-Capillary: 137 mg/dL — ABNORMAL HIGH (ref 70–99)
Glucose-Capillary: 176 mg/dL — ABNORMAL HIGH (ref 70–99)

## 2022-08-23 LAB — FERRITIN: Ferritin: 2 ng/mL — ABNORMAL LOW (ref 11–307)

## 2022-08-23 LAB — CBC
HCT: 29.5 % — ABNORMAL LOW (ref 36.0–46.0)
Hemoglobin: 8.6 g/dL — ABNORMAL LOW (ref 12.0–15.0)
MCH: 21.7 pg — ABNORMAL LOW (ref 26.0–34.0)
MCHC: 29.2 g/dL — ABNORMAL LOW (ref 30.0–36.0)
MCV: 74.3 fL — ABNORMAL LOW (ref 80.0–100.0)
Platelets: 435 10*3/uL — ABNORMAL HIGH (ref 150–400)
RBC: 3.97 MIL/uL (ref 3.87–5.11)
RDW: 19.1 % — ABNORMAL HIGH (ref 11.5–15.5)
WBC: 11.2 10*3/uL — ABNORMAL HIGH (ref 4.0–10.5)
nRBC: 0 % (ref 0.0–0.2)

## 2022-08-23 LAB — FOLATE: Folate: 12.7 ng/mL (ref >5.9)

## 2022-08-23 LAB — IRON AND TIBC
Iron: 38 ug/dL (ref 28–170)
Saturation Ratios: 6 % — ABNORMAL LOW (ref 10.4–31.8)
TIBC: 696 ug/dL — ABNORMAL HIGH (ref 250–450)
UIBC: 658 ug/dL

## 2022-08-23 LAB — HEMOGLOBIN A1C
Hgb A1c MFr Bld: 6.2 % — ABNORMAL HIGH (ref 4.8–5.6)
Mean Plasma Glucose: 131.24 mg/dL

## 2022-08-23 LAB — MAGNESIUM: Magnesium: 2.1 mg/dL (ref 1.7–2.4)

## 2022-08-23 LAB — RETICULOCYTES
Immature Retic Fract: 25.1 % — ABNORMAL HIGH (ref 2.3–15.9)
RBC.: 4.08 MIL/uL (ref 3.87–5.11)
Retic Count, Absolute: 75.5 K/uL (ref 19.0–186.0)
Retic Ct Pct: 1.9 % (ref 0.4–3.1)

## 2022-08-23 LAB — PHOSPHORUS: Phosphorus: 3 mg/dL (ref 2.5–4.6)

## 2022-08-23 LAB — CBG MONITORING, ED: Glucose-Capillary: 138 mg/dL — ABNORMAL HIGH (ref 70–99)

## 2022-08-23 LAB — MRSA NEXT GEN BY PCR, NASAL: MRSA by PCR Next Gen: NOT DETECTED

## 2022-08-23 MED ORDER — HYDROXYZINE HCL 25 MG PO TABS
25.0000 mg | ORAL_TABLET | Freq: Every evening | ORAL | Status: DC | PRN
  Administered 2022-08-23: 25 mg via ORAL
  Filled 2022-08-23: qty 1

## 2022-08-23 MED ORDER — INSULIN ASPART 100 UNIT/ML IJ SOLN
0.0000 [IU] | Freq: Every day | INTRAMUSCULAR | Status: DC
  Filled 2022-08-23: qty 0.05

## 2022-08-23 MED ORDER — POTASSIUM CHLORIDE 2 MEQ/ML IV SOLN
INTRAVENOUS | Status: DC
  Filled 2022-08-23 (×10): qty 1000

## 2022-08-23 MED ORDER — POTASSIUM CHLORIDE 10 MEQ/100ML IV SOLN
10.0000 meq | INTRAVENOUS | Status: AC
  Administered 2022-08-23 (×3): 10 meq via INTRAVENOUS
  Filled 2022-08-23 (×3): qty 100

## 2022-08-23 MED ORDER — OYSTER SHELL CALCIUM/D3 500-5 MG-MCG PO TABS
2.0000 | ORAL_TABLET | Freq: Two times a day (BID) | ORAL | Status: DC
  Administered 2022-08-23 – 2022-08-24 (×3): 2 via ORAL
  Filled 2022-08-23 (×4): qty 2

## 2022-08-23 MED ORDER — POTASSIUM CHLORIDE 20 MEQ PO PACK
40.0000 meq | PACK | Freq: Once | ORAL | Status: AC
  Administered 2022-08-23: 40 meq via ORAL
  Filled 2022-08-23: qty 2

## 2022-08-23 MED ORDER — PROCHLORPERAZINE EDISYLATE 10 MG/2ML IJ SOLN
5.0000 mg | Freq: Four times a day (QID) | INTRAMUSCULAR | Status: DC | PRN
  Administered 2022-08-23 (×2): 5 mg via INTRAVENOUS
  Filled 2022-08-23 (×2): qty 2

## 2022-08-23 MED ORDER — ENOXAPARIN SODIUM 40 MG/0.4ML IJ SOSY
40.0000 mg | PREFILLED_SYRINGE | INTRAMUSCULAR | Status: DC
  Administered 2022-08-23 – 2022-08-24 (×2): 40 mg via SUBCUTANEOUS
  Filled 2022-08-23 (×2): qty 0.4

## 2022-08-23 MED ORDER — POTASSIUM CHLORIDE 10 MEQ/100ML IV SOLN
INTRAVENOUS | Status: AC
  Administered 2022-08-23: 10 meq via INTRAVENOUS
  Filled 2022-08-23: qty 100

## 2022-08-23 MED ORDER — ESCITALOPRAM OXALATE 20 MG PO TABS
20.0000 mg | ORAL_TABLET | Freq: Every day | ORAL | Status: DC
  Administered 2022-08-23 – 2022-08-24 (×2): 20 mg via ORAL
  Filled 2022-08-23: qty 1
  Filled 2022-08-23: qty 2

## 2022-08-23 MED ORDER — PANTOPRAZOLE SODIUM 40 MG PO TBEC
40.0000 mg | DELAYED_RELEASE_TABLET | Freq: Every day | ORAL | Status: DC
  Administered 2022-08-23 – 2022-08-24 (×2): 40 mg via ORAL
  Filled 2022-08-23 (×2): qty 1

## 2022-08-23 MED ORDER — MAGNESIUM SULFATE 2 GM/50ML IV SOLN
2.0000 g | Freq: Once | INTRAVENOUS | Status: AC
  Administered 2022-08-23: 2 g via INTRAVENOUS
  Filled 2022-08-23: qty 50

## 2022-08-23 MED ORDER — ORAL CARE MOUTH RINSE: 15.0000 mL | OROMUCOSAL | Status: DC | PRN

## 2022-08-23 MED ORDER — LEVOTHYROXINE SODIUM 100 MCG PO TABS
100.0000 ug | ORAL_TABLET | Freq: Every day | ORAL | Status: DC
  Administered 2022-08-24: 100 ug via ORAL
  Filled 2022-08-23 (×3): qty 1

## 2022-08-23 MED ORDER — CHLORHEXIDINE GLUCONATE CLOTH 2 % EX PADS
6.0000 | MEDICATED_PAD | Freq: Every day | CUTANEOUS | Status: DC
  Administered 2022-08-23: 6 via TOPICAL

## 2022-08-23 MED ORDER — MORPHINE SULFATE (PF) 2 MG/ML IV SOLN
1.0000 mg | INTRAVENOUS | Status: DC | PRN
  Administered 2022-08-23: 1 mg via INTRAVENOUS
  Filled 2022-08-23: qty 1

## 2022-08-23 MED ORDER — ACETAMINOPHEN 10 MG/ML IV SOLN
1000.0000 mg | Freq: Once | INTRAVENOUS | Status: AC
  Administered 2022-08-23: 1000 mg via INTRAVENOUS
  Filled 2022-08-23: qty 100

## 2022-08-23 MED ORDER — INSULIN ASPART 100 UNIT/ML IJ SOLN
0.0000 [IU] | Freq: Three times a day (TID) | INTRAMUSCULAR | Status: DC
  Administered 2022-08-23: 1 [IU] via SUBCUTANEOUS
  Administered 2022-08-23 – 2022-08-24 (×2): 2 [IU] via SUBCUTANEOUS
  Filled 2022-08-23: qty 0.09

## 2022-08-23 MED ORDER — ACETAMINOPHEN 325 MG PO TABS
650.0000 mg | ORAL_TABLET | Freq: Four times a day (QID) | ORAL | Status: DC | PRN
  Filled 2022-08-23: qty 2

## 2022-08-23 NOTE — H&P (Addendum)
History and Physical  Melanie [name] DOB: 06-Jul-1986 DOA: 08/22/2022  Referring physician: Dr. Regenia Skeeter, EDP PCP: Nolene Ebbs, MD  Outpatient Specialists: GI. Patient coming from: Home.  Chief Complaint: Sudden onset nausea vomiting diarrhea  HPI: Melanie [name] is a 37 y.o. female with medical history significant for, chron's disease not currently on medical therapy, diet controlled type 2 diabetes, hypothyroidism, chronic anxiety/depression, GERD, who presented to [facility] ED from home due to sudden onset nausea, vomiting, diarrhea, prior to presenting to the ED tonight.  No subjective fevers reported.  Denies hematochezia.  States she is on her menses.    Nausea and diarrhea persist in the ED.  Afebrile with no leukocytosis.  Anemic with hemoglobin of 9.6K from baseline of 11K.  CT abdomen and pelvis showed findings suggestive of possible mild colitis.  No bowel obstruction.  The patient states her symptoms are similar to prior Crohn's flare.  EDP discussed the case with GI who will see in consultation.  Dr. Carlean Purl, [facility], recommended supportive care for now.  The patient was admitted by [facility], hospitalist service.  ED Course: Tmax 98.  BP 126/73, pulse 47, respiration rate 19, O2 saturation 97% on room air.  Lab studies remarkable for WBC 8.7, hemoglobin 9.6 K from baseline of 11 K.  MCV 78.  Platelet count 498 K.  Serum potassium 2.4, bicarb 16, glucose 250, anion gap 19, magnesium 1.4.  Lactic acid 5.3, 2.7.  Review of Systems: Review of systems as noted in the HPI. All other systems reviewed and are negative.   Past Medical History:  Diagnosis Date   ADHD (attention deficit hyperactivity disorder)    Anemia    Anxiety    Cancer ([place]) 2023   Thyroid cancer   Cardiac tumor    Atrial myxoma, removed at age 11   Crohn's disease ([place])    Fibroids    GERD (gastroesophageal reflux disease)    Gestational diabetes    Gestational diabetes mellitus in pregnancy     History of genital warts    Past Surgical History:  Procedure Laterality Date   CARDIAC SURGERY     at 37 years old   COLONOSCOPY W/ BIOPSIES  08/2012   active ileitis, normal colon (bxs of both) Dr. Oletta Lamas   THYROIDECTOMY N/A 02/05/2022   Procedure: TOTAL THYROIDECTOMY;  Surgeon: Izora Gala, MD;  Location: [facility];  Service: ENT;  Laterality: N/A;  REQUESTS RNFA   TUBAL LIGATION Bilateral 02/15/2014   Procedure: POST PARTUM TUBAL LIGATION;  Surgeon: Farrel Gobble. Harrington Challenger, MD;  Location: [facility];  Service: Gynecology;  Laterality: Bilateral;    Social History:  reports that she has never smoked. She has never used smokeless tobacco. She reports current alcohol use. She reports current drug use. Drug: Marijuana.   No Known Allergies  Family History  Problem Relation Age of Onset   Hypertension Mother    Heart Problems Mother        tumor, open heart surgery   Miscarriages / Stillbirths Mother    Diabetes Father    Alcohol abuse Father    Heart Problems Maternal Aunt        tumor   Diabetes Paternal Grandmother    Diabetes Maternal Grandmother    Colon cancer Neg Hx    Colon polyps Neg Hx    Kidney disease Neg Hx    Esophageal cancer Neg Hx       Prior to Admission medications   Medication Sig Start Date  End Date Taking? Authorizing Provider  escitalopram (LEXAPRO) 20 MG tablet Take 20 mg by mouth daily. 01/14/22  Yes [provider]  hydrOXYzine (ATARAX) 25 MG tablet Take 25 mg by mouth at bedtime as needed (sleep). 06/26/21  Yes [provider]  levothyroxine (SYNTHROID) 100 MCG tablet Take 1 tablet (100 mcg total) by mouth daily. 02/05/22  Yes Izora Gala, MD  omeprazole (PRILOSEC) 20 MG capsule Take 20 mg by mouth 2 (two) times daily. 07/07/21  Yes [provider]  Vitamin D, Ergocalciferol, (DRISDOL) 1.25 MG (50000 UNIT) CAPS capsule Take 50,000 Units by mouth once a week. 04/19/20  Yes [provider]  calcium-vitamin D (OSCAL WITH D) 500-5  MG-MCG tablet Take 2 tablets by mouth 2 (two) times daily. 02/06/22 03/08/22  Skotnicki, Meghan A, DO  HYDROcodone-acetaminophen (NORCO) 7.5-325 MG tablet Take 1 tablet by mouth every 6 (six) hours as needed for moderate pain. Patient not taking: Reported on 08/22/2022 02/05/22   Izora Gala, MD  ondansetron (ZOFRAN-ODT) 4 MG disintegrating tablet Take 1 tablet (4 mg total) by mouth every 8 (eight) hours as needed for nausea or vomiting. Patient not taking: Reported on 08/22/2022 02/05/22   Izora Gala, MD    Physical Exam: BP 109/69   Pulse 60   Temp 98 F (36.7 C)   Resp (!) 24   Ht 5' 4"$  (1.626 m)   Wt 61.2 kg   SpO2 99%   BMI 23.17 kg/m   General: 37 y.o. year-old female well developed well nourished in no acute distress.  Alert and oriented x3. Cardiovascular: Regular rate and rhythm with no rubs or gallops.  No thyromegaly or JVD noted.  No lower extremity edema. 2/4 pulses in all 4 extremities. Respiratory: Clear to auscultation with no wheezes or rales. Good inspiratory effort. Abdomen: Soft diffuse tenderness with palpation.  Nondistended with normal bowel sounds x4 quadrants. Muskuloskeletal: No cyanosis, clubbing or edema noted bilaterally Neuro: CN II-XII intact, strength, sensation, reflexes Skin: No ulcerative lesions noted or rashes Psychiatry: Judgement and insight appear normal. Mood is appropriate for condition and setting          Labs on Admission:  Basic Metabolic Panel: Recent Labs  Lab 08/22/22 1940 08/22/22 2230  NA 139  --   K 2.4*  --   CL 104  --   CO2 16*  --   GLUCOSE 250*  --   BUN 10  --   CREATININE 0.88  --   CALCIUM 8.9  --   MG  --  1.4*   Liver Function Tests: Recent Labs  Lab 08/22/22 1940  AST 23  ALT 14  ALKPHOS 74  BILITOT 0.3  PROT 8.0  ALBUMIN 3.9   Recent Labs  Lab 08/22/22 1940  LIPASE 38   No results for input(s): "AMMONIA" in the last 168 hours. CBC: Recent Labs  Lab 08/22/22 1940  WBC 8.7  NEUTROABS 5.6   HGB 9.6*  HCT 34.1*  MCV 78.0*  PLT 498*   Cardiac Enzymes: No results for input(s): "CKTOTAL", "CKMB", "CKMBINDEX", "TROPONINI" in the last 168 hours.  BNP (last 3 results) No results for input(s): "BNP" in the last 8760 hours.  ProBNP (last 3 results) No results for input(s): "PROBNP" in the last 8760 hours.  CBG: Recent Labs  Lab 08/22/22 2208  GLUCAP 185*    Radiological Exams on Admission: CT ABDOMEN PELVIS W CONTRAST  Result Date: 08/22/2022 CLINICAL DATA:  Crohn's exacerbation. EXAM: CT ABDOMEN AND PELVIS WITH  CONTRAST TECHNIQUE: Multidetector CT imaging of the abdomen and pelvis was performed using the standard protocol following bolus administration of intravenous contrast. RADIATION DOSE REDUCTION: This exam was performed according to the departmental dose-optimization program which includes automated exposure control, adjustment of the mA and/or kV according to patient size and/or use of iterative reconstruction technique. CONTRAST:  11m OMNIPAQUE IOHEXOL 300 MG/ML  SOLN COMPARISON:  CT abdomen pelvis dated 04/20/2020. FINDINGS: Lower chest: The visualized lung bases are clear. No intra-abdominal free air.  Small free fluid in the pelvis. Hepatobiliary: Several subcentimeter hepatic hypodense lesions are too small to characterize. No biliary dilatation. Small gallstone. No pericholecystic fluid or evidence of acute cholecystitis by CT. Pancreas: Unremarkable. No pancreatic ductal dilatation or surrounding inflammatory changes. Spleen: Normal in size without focal abnormality. Adrenals/Urinary Tract: Mild haziness of the adrenal gland similar to prior CT. There is no hydronephrosis on either side. The visualized ureters and urinary bladder appear unremarkable. Stomach/Bowel: There is mild diffuse thickened appearance of the colon with submucosal fat deposit. Findings may be partly related to underdistention or sequela of chronic inflammation. Mild colitis is not excluded  clinical correlation is recommended. No definite inflammatory changes of the small bowel. There is no bowel obstruction. The appendix is normal. Vascular/Lymphatic: The abdominal aorta and IVC are unremarkable. No portal venous gas. There is no adenopathy. Reproductive: The uterus is anteverted. Multiple uterine fibroids noted. A tampon is noted in the vagina. No adnexal masses. Other: Small fat containing umbilical hernia. Musculoskeletal: No acute or significant osseous findings. IMPRESSION: 1. Underdistention of the colon or sequela of chronic inflammation. Mild colitis is not excluded clinical correlation is recommended. No bowel obstruction. Normal appendix. 2. Cholelithiasis. 3. Uterine fibroids. Electronically Signed   By: AAnner CreteM.D.   On: 08/22/2022 21:48    EKG: I independently viewed the EKG done and my findings are as followed: Sinus bradycardia rate of 50.  Nonspecific ST-T changes.  QTc 477.  Assessment/Plan Present on Admission:  Colitis  Principal Problem:   Colitis  Suspected mild colitis, POA CT abdomen and pelvis showed findings suggestive of possible mild colitis.   GI Dr. GCarlean Purlconsulted, recommended supportive care, for now. Continue gentle IV fluid hydration LR KCl 40 mill equivalent at 75 cc/h x 2 days. IV antiemetics as needed IV pain medication as needed   Lactic acidosis Lactic acid on presentation 5.3, repeat 2.7 after IV fluid hydration Follow cultures UA is pending Continue IV fluid hydration Monitor fever curve and WBC Supportive care as recommended by GI at this time.  Crohn's disease with concern for flare Reports symptoms are similar to prior Crohn's flare Not on therapy for Crohn's disease for years. Management per GI  Severe hypokalemia Serum potassium on presentation, 2.4. Repleted orally and intravenously. Magnesium 1.4, repleted. Continue LR KCl 40 mill equivalent at 75 cc/h x 2 days. Repeat BMP in the  morning.  Hypomagnesemia Serum magnesium 1.4 Repleted intravenously with 2 g IV magnesium.  High anion gap metabolic acidosis in the setting of lactic acidosis Serum bicarb 16, anion gap 19, serum glucose 250 UA is pending.  A1c is also pending.  Less likely DKA. Continue IV fluid hydration Repeat labs  Acute blood loss anemia in the setting of menses Presented with hemoglobin 9.6 with MCV of 78 from baseline hemoglobin of 11. Denies hematochezia, hematemesis, or hematuria. Admits to being on her menses Monitor H&H and transfuse if indicated.  Diet controlled type 2 diabetes with hyperglycemia Last hemoglobin A1c 6.2  on 08/05/2021 Presented with serum glucose of 250 Obtain hemoglobin A1c Start heart healthy carb modified diet. Start insulin sliding scale. Continue IV fluid hydration  Chronic anxiety/depression Stable Resume home escitalopram and as needed Atarax  Hypothyroidism Resume home levothyroxine  GERD Resume home regimen.   Critical care time: 65 minutes.   DVT prophylaxis: Subcu Lovenox daily due to high inflammatory state in the setting of Crohn's disease with concern for flare.  High risk for thrombosis.  Closely monitor H&H and reassess the use of pharmacological DVT prophylaxis in the morning.  Code Status: Full code  Family Communication: None at bedside  Disposition Plan: Admitted to stepdown unit  Consults called: GI.  Admission status: Inpatient status.   Status is: Inpatient The patient requires at least 2 midnights for further evaluation and treatment of present condition.   Kayleen Memos MD [facility] Pager [phone]  If 7PM-7AM, please contact night-coverage [email or web address] Password [facility]  08/23/2022, 1:27 AM

## 2022-08-23 NOTE — ED Notes (Signed)
Pt requesting a shower. Provided pt with items to clean herself at bedside.

## 2022-08-23 NOTE — Consult Note (Addendum)
Consultation  Referring Provider:      Primary Care Physician:  Nolene Ebbs, MD Primary Gastroenterologist:         Reason for Consultation:          Impression / Plan:   Nausea vomiting and diarrhea in a patient with a history of ileal Crohn's disease not on therapy for many years and asymptomatic, could be gastroenteritis versus Crohn's flare.  CT shows thickening of the colon inflammatory versus under distention.  Microcytic anemia likely iron deficiency in the setting of menorrhagia.  Chronic GI blood loss not ruled out though no reported bleeding could have occult blood loss.  Significant electrolyte derangements.  Cholelithiasis noted on CT I do not think that is playing a role in her current illness.   It sounds like she is significantly improved from yesterday.  -------------------------------------------------------------------------------------------------------------------  She needs continued supportive care and electrolyte repletion.  She is not ready to tolerate solid food but advance diet from liquids as tolerated.  We will follow-up again tomorrow and determine a plan.  I think it would make sense for her to have a colonoscopy but I do not know that she needs to stay in the hospital for that pending clinical course.  Agree with workup of anemia as ordered by [facility].  Gatha Mayer, MD, [facility] See Shea Evans on call - gastroenterology for best contact person 08/23/2022 11:44 AM           HPI:   Melanie [name] is a 37 y.o. female with a prior diagnosis of ileal Crohn's disease mated colonoscopy in 2014 by Dr. Oletta Lamas of [facility] gastroenterology.  She was discharged from that practice and then we saw her in 2015, at the hospital once and then in our office for follow-up.  She had been on Pentasa when treated by the [facility] group though not sure how long.  We recommended a colonoscopy but she did not follow-up and she has not been seen by GI  for many years and reports no symptoms of abdominal pain nausea vomiting diarrhea or bleeding.  She does have menorrhagia having heavy periods over the years.  In July 2023 she had a total thyroidectomy for a suspicious thyroid nodule it turned out to be multinodular goiter and no malignancy.  She has not had sick contacts, the symptoms this time started suddenly with nausea vomiting and some crampy abdominal pain and watery diarrhea about 24 hours prior to admission.  She has not vomited since she has been here though she remains nauseous and her last stool was yesterday.  Still feels weak.  Is interested in going home as soon as possible.  There was no rectal bleeding or hematochezia.  Prior to admission she had eaten chicken nuggets at [facility] but again no other sick contacts.  She does use marijuana, no tobacco rare alcohol.  Past Medical History:  Diagnosis Date   ADHD (attention deficit hyperactivity disorder)    Anemia    Anxiety    Cancer ([place]) 2023   Thyroid cancer   Cardiac tumor    Atrial myxoma, removed at age 74   Crohn's disease ([place])    Fibroids    GERD (gastroesophageal reflux disease)    Gestational diabetes    Gestational diabetes mellitus in pregnancy    History of genital warts     Past Surgical History:  Procedure Laterality Date   CARDIAC SURGERY     at 37 years old   COLONOSCOPY  W/ BIOPSIES  08/2012   active ileitis, normal colon (bxs of both) Dr. Oletta Lamas   THYROIDECTOMY N/A 02/05/2022   Procedure: TOTAL THYROIDECTOMY;  Surgeon: Izora Gala, MD;  Location: [facility];  Service: ENT;  Laterality: N/A;  REQUESTS RNFA   TUBAL LIGATION Bilateral 02/15/2014   Procedure: POST PARTUM TUBAL LIGATION;  Surgeon: Farrel Gobble. Harrington Challenger, MD;  Location: [facility];  Service: Gynecology;  Laterality: Bilateral;    Family History  Problem Relation Age of Onset   Hypertension Mother    Heart Problems Mother        tumor, open heart surgery   Miscarriages / Stillbirths Mother    Diabetes  Father    Alcohol abuse Father    Heart Problems Maternal Aunt        tumor   Diabetes Paternal Grandmother    Diabetes Maternal Grandmother    Colon cancer Neg Hx    Colon polyps Neg Hx    Kidney disease Neg Hx    Esophageal cancer Neg Hx      Social History   Tobacco Use   Smoking status: Never   Smokeless tobacco: Never  Vaping Use   Vaping Use: Never used  Substance Use Topics   Alcohol use: Yes    Alcohol/week: 0.0 standard drinks of alcohol    Comment: occasionally.   Drug use: Yes    Types: Marijuana    Comment: Smokes to help with sleep    Prior to Admission medications   Medication Sig Start Date End Date Taking? Authorizing Provider  escitalopram (LEXAPRO) 20 MG tablet Take 20 mg by mouth daily. 01/14/22  Yes [provider]  hydrOXYzine (ATARAX) 25 MG tablet Take 25 mg by mouth at bedtime as needed (sleep). 06/26/21  Yes [provider]  levothyroxine (SYNTHROID) 100 MCG tablet Take 1 tablet (100 mcg total) by mouth daily. 02/05/22  Yes Izora Gala, MD  omeprazole (PRILOSEC) 20 MG capsule Take 20 mg by mouth 2 (two) times daily. 07/07/21  Yes [provider]  Vitamin D, Ergocalciferol, (DRISDOL) 1.25 MG (50000 UNIT) CAPS capsule Take 50,000 Units by mouth once a week. 04/19/20  Yes [provider]  calcium-vitamin D (OSCAL WITH D) 500-5 MG-MCG tablet Take 2 tablets by mouth 2 (two) times daily. 02/06/22 03/08/22  Skotnicki, Meghan A, DO  HYDROcodone-acetaminophen (NORCO) 7.5-325 MG tablet Take 1 tablet by mouth every 6 (six) hours as needed for moderate pain. Patient not taking: Reported on 08/22/2022 02/05/22   Izora Gala, MD  ondansetron (ZOFRAN-ODT) 4 MG disintegrating tablet Take 1 tablet (4 mg total) by mouth every 8 (eight) hours as needed for nausea or vomiting. Patient not taking: Reported on 08/22/2022 02/05/22   Izora Gala, MD    Current Facility-Administered Medications  Medication Dose Route Frequency Provider Last Rate  Last Admin   acetaminophen (TYLENOL) tablet 650 mg  650 mg Oral Q6H PRN Kayleen Memos, DO       calcium-vitamin D (OSCAL WITH D) 500-5 MG-MCG per tablet 2 tablet  2 tablet Oral BID Hall, Carole N, DO       enoxaparin (LOVENOX) injection 40 mg  40 mg Subcutaneous Q24H Hall, Carole N, DO   40 mg at 08/23/22 0930   escitalopram (LEXAPRO) tablet 20 mg  20 mg Oral Daily Irene Pap N, DO   20 mg at 08/23/22 0930   hydrOXYzine (ATARAX) tablet 25 mg  25 mg Oral QHS PRN Kayleen Memos, DO  insulin aspart (novoLOG) injection 0-5 Units  0-5 Units Subcutaneous QHS Hall, Carole N, DO       insulin aspart (novoLOG) injection 0-9 Units  0-9 Units Subcutaneous TID WC Hall, Carole N, DO       lactated ringers 1,000 mL with potassium chloride 40 mEq infusion   Intravenous Continuous Regalado, Belkys A, MD 75 mL/hr at 08/23/22 0511 New Bag at 08/23/22 0511   levothyroxine (SYNTHROID) tablet 100 mcg  100 mcg Oral Q0600 Irene Pap N, DO       morphine (PF) 2 MG/ML injection 1 mg  1 mg Intravenous Q3H PRN Regalado, Belkys A, MD   1 mg at 08/23/22 0859   pantoprazole (PROTONIX) EC tablet 40 mg  40 mg Oral Daily Irene Pap N, DO   40 mg at 08/23/22 0930   prochlorperazine (COMPAZINE) injection 5 mg  5 mg Intravenous Q6H PRN Irene Pap N, DO   5 mg at 08/23/22 1032   Current Outpatient Medications  Medication Sig Dispense Refill   escitalopram (LEXAPRO) 20 MG tablet Take 20 mg by mouth daily.     hydrOXYzine (ATARAX) 25 MG tablet Take 25 mg by mouth at bedtime as needed (sleep).     levothyroxine (SYNTHROID) 100 MCG tablet Take 1 tablet (100 mcg total) by mouth daily. 30 tablet 6   omeprazole (PRILOSEC) 20 MG capsule Take 20 mg by mouth 2 (two) times daily.     Vitamin D, Ergocalciferol, (DRISDOL) 1.25 MG (50000 UNIT) CAPS capsule Take 50,000 Units by mouth once a week.     calcium-vitamin D (OSCAL WITH D) 500-5 MG-MCG tablet Take 2 tablets by mouth 2 (two) times daily. 120 tablet 0    HYDROcodone-acetaminophen (NORCO) 7.5-325 MG tablet Take 1 tablet by mouth every 6 (six) hours as needed for moderate pain. (Patient not taking: Reported on 08/22/2022) 20 tablet 0   ondansetron (ZOFRAN-ODT) 4 MG disintegrating tablet Take 1 tablet (4 mg total) by mouth every 8 (eight) hours as needed for nausea or vomiting. (Patient not taking: Reported on 08/22/2022) 20 tablet 0    Allergies as of 08/22/2022   (No Known Allergies)     Review of Systems:    This is positive for those things mentioned in the HPI. All other review of systems are negative.       Physical Exam:  Vital signs in last 24 hours: Temp:  [98 F (36.7 C)-98.9 F (37.2 C)] 98.9 F (37.2 C) (02/12 0916) Pulse Rate:  [44-62] 62 (02/12 0916) Resp:  [14-24] 14 (02/12 0916) BP: (109-168)/(69-99) 141/88 (02/12 0916) SpO2:  [95 %-100 %] 95 % (02/12 0916) Weight:  [61.2 kg] 61.2 kg (02/11 1927)    General:  Well-developed, well-nourished and in no acute distress but looking mildly ill Eyes:  anicteric. ENT:   Mouth and posterior pharynx free of lesions.  Moist mucous membranes Lungs: Clear to auscultation bilaterally. Heart:   S1S2, no rubs, murmurs, gallops. Abdomen:  soft, non-tender, no hepatosplenomegaly, hernia, or mass and BS+.  Lymph:  no cervical or supraclavicular adenopathy. Extremities:   no edema Skin   no rash. Neuro:  A&O x 3.  Psych:  appropriate mood and  Affect.   Data Reviewed:   LAB RESULTS: Recent Labs    08/22/22 1940 08/23/22 0515  WBC 8.7 11.2*  HGB 9.6* 8.6*  HCT 34.1* 29.5*  PLT 498* 435*   BMET Recent Labs    08/22/22 1940 08/23/22 0515  NA 139 136  K 2.4*  2.7*  CL 104 100  CO2 16* 25  GLUCOSE 250* 184*  BUN 10 6  CREATININE 0.88 0.73  CALCIUM 8.9 8.8*   LFT Recent Labs    08/23/22 0515  PROT 8.0  ALBUMIN 3.9  AST 29  ALT 14  ALKPHOS 74  BILITOT 0.6   Lab Results  Component Value Date   LIPASE 38 08/22/2022     STUDIES: CT ABDOMEN PELVIS W  CONTRAST  Result Date: 08/22/2022 CLINICAL DATA:  Crohn's exacerbation. EXAM: CT ABDOMEN AND PELVIS WITH CONTRAST TECHNIQUE: Multidetector CT imaging of the abdomen and pelvis was performed using the standard protocol following bolus administration of intravenous contrast. RADIATION DOSE REDUCTION: This exam was performed according to the departmental dose-optimization program which includes automated exposure control, adjustment of the mA and/or kV according to patient size and/or use of iterative reconstruction technique. CONTRAST:  142m OMNIPAQUE IOHEXOL 300 MG/ML  SOLN COMPARISON:  CT abdomen pelvis dated 04/20/2020. FINDINGS: Lower chest: The visualized lung bases are clear. No intra-abdominal free air.  Small free fluid in the pelvis. Hepatobiliary: Several subcentimeter hepatic hypodense lesions are too small to characterize. No biliary dilatation. Small gallstone. No pericholecystic fluid or evidence of acute cholecystitis by CT. Pancreas: Unremarkable. No pancreatic ductal dilatation or surrounding inflammatory changes. Spleen: Normal in size without focal abnormality. Adrenals/Urinary Tract: Mild haziness of the adrenal gland similar to prior CT. There is no hydronephrosis on either side. The visualized ureters and urinary bladder appear unremarkable. Stomach/Bowel: There is mild diffuse thickened appearance of the colon with submucosal fat deposit. Findings may be partly related to underdistention or sequela of chronic inflammation. Mild colitis is not excluded clinical correlation is recommended. No definite inflammatory changes of the small bowel. There is no bowel obstruction. The appendix is normal. Vascular/Lymphatic: The abdominal aorta and IVC are unremarkable. No portal venous gas. There is no adenopathy. Reproductive: The uterus is anteverted. Multiple uterine fibroids noted. A tampon is noted in the vagina. No adnexal masses. Other: Small fat containing umbilical hernia. Musculoskeletal: No  acute or significant osseous findings. IMPRESSION: 1. Underdistention of the colon or sequela of chronic inflammation. Mild colitis is not excluded clinical correlation is recommended. No bowel obstruction. Normal appendix. 2. Cholelithiasis. 3. Uterine fibroids. Electronically Signed   By: AAnner CreteM.D.   On: 08/22/2022 21:48        Thanks   LOS: 1 day   @Conchetta Lamia$  ESimonne Maffucci MD, FAlexian Brothers Behavioral [facility]@  08/23/2022, 11:44 AM

## 2022-08-23 NOTE — Progress Notes (Signed)
PROGRESS NOTE    Melanie [name]  [id number] DOB: 1985/09/17 DOA: 08/22/2022 PCP: Nolene Ebbs, MD   Brief Narrative: 37 year old with past medical history significant for Crohn's disease not on medical therapy, diabetes type 2 on diet, hypothyroidism, chronic anxiety/depression, GERD presents complaining of sudden onset of nausea vomiting diarrhea that is started the night of admission.  CT abdomen and pelvis showed findings suggestive of possible mild colitis.  Negative for obstruction.  [facility] has been consulted.   Assessment & Plan:   Principal Problem:   Colitis   1-Mild colitis vs Crohn's flare/  vs gastroenteritis.:  Continue with support care.  IV fluids.  PRN zofran Protonix.  Morphine PRN pain  Lactic acidosis; in setting vomiting, dehydration Continue with IV fluids.  Lactic acid decrease to 2 from 5.  Hypokalemia, Severe Replete IV  Hypomagnesemia: Replaced.   High anion gap metabolic acidosis in the setting of lactic acidosis Improved with IV fluids.    anemia in the setting of menorrhagia.  Check TSH. Anemia panel.  Needs follow up with GYN  Diet-controlled diabetes type 2 with hyperglycemia SSI  Chronic anxiety depression On Lexapro  Hypothyroidism: Continue with synthroid  GERD; PPI  Estimated body mass index is 23.17 kg/m as calculated from the following:   Height as of this encounter: 5' 4"$  (1.626 m).   Weight as of this encounter: 61.2 kg.   DVT prophylaxis: SCD Code Status: Full code Family Communication: care discussed with patient.  Disposition Plan:  Status is: Inpatient Remains inpatient appropriate because: management of acidosis, vomiting.     Consultants:  GI  Procedures:  None  Antimicrobials:    Subjective: She is feeling some better. No vomiting since last night.  Had multiples episode of vomiting and diarrhea on admission. Abdominal pain persist but better.  She report menorrhagia.    Objective: Vitals:   08/22/22 2300 08/23/22 0100 08/23/22 0200 08/23/22 0452  BP:  126/73  114/76  Pulse:  (!) 47  (!) 49  Resp:  19  16  Temp: 98 F (36.7 C)  98 F (36.7 C) 98.8 F (37.1 C)  TempSrc:    Oral  SpO2:  97%  96%  Weight:      Height:        Intake/Output Summary (Last 24 hours) at 08/23/2022 0807 Last data filed at 08/22/2022 2220 Gross per 24 hour  Intake 1000 ml  Output --  Net 1000 ml   Filed Weights   08/22/22 1927  Weight: 61.2 kg    Examination:  General exam: Appears calm and comfortable  Respiratory system: Clear to auscultation. Respiratory effort normal. Cardiovascular system: S1 & S2 heard, RRR. No JVD, murmurs, rubs, gallops or clicks. No pedal edema. Gastrointestinal system: Abdomen is nondistended, soft and mild tender.  Central nervous system: Alert and oriented. No focal neurological deficits.   Data Reviewed: I have personally reviewed following labs and imaging studies  CBC: Recent Labs  Lab 08/22/22 1940 08/23/22 0515  WBC 8.7 11.2*  NEUTROABS 5.6  --   HGB 9.6* 8.6*  HCT 34.1* 29.5*  MCV 78.0* 74.3*  PLT 498* [id number]*   Basic Metabolic Panel: Recent Labs  Lab 08/22/22 1940 08/22/22 2230 08/23/22 0515  NA 139  --  136  K 2.4*  --  2.7*  CL 104  --  100  CO2 16*  --  25  GLUCOSE 250*  --  184*  BUN 10  --  6  CREATININE 0.88  --  0.73  CALCIUM 8.9  --  8.8*  MG  --  1.4* 2.1  PHOS  --   --  3.0   GFR: Estimated Creatinine Clearance: 85.6 mL/min (by C-G formula based on SCr of 0.73 mg/dL). Liver Function Tests: Recent Labs  Lab 08/22/22 1940 08/23/22 0515  AST 23 29  ALT 14 14  ALKPHOS 74 74  BILITOT 0.3 0.6  PROT 8.0 8.0  ALBUMIN 3.9 3.9   Recent Labs  Lab 08/22/22 1940  LIPASE 38   No results for input(s): "AMMONIA" in the last 168 hours. Coagulation Profile: No results for input(s): "INR", "PROTIME" in the last 168 hours. Cardiac Enzymes: No results for input(s): "CKTOTAL", "CKMB", "CKMBINDEX",  "TROPONINI" in the last 168 hours. BNP (last 3 results) No results for input(s): "PROBNP" in the last 8760 hours. HbA1C: No results for input(s): "HGBA1C" in the last 72 hours. CBG: Recent Labs  Lab 08/22/22 2208 08/23/22 0744  GLUCAP 185* 138*   Lipid Profile: No results for input(s): "CHOL", "HDL", "LDLCALC", "TRIG", "CHOLHDL", "LDLDIRECT" in the last 72 hours. Thyroid Function Tests: No results for input(s): "TSH", "T4TOTAL", "FREET4", "T3FREE", "THYROIDAB" in the last 72 hours. Anemia Panel: No results for input(s): "VITAMINB12", "FOLATE", "FERRITIN", "TIBC", "IRON", "RETICCTPCT" in the last 72 hours. Sepsis Labs: Recent Labs  Lab 08/22/22 1940 08/22/22 2140  LATICACIDVEN 5.3* 2.7*    Recent Results (from the past 240 hour(s))  Resp panel by RT-PCR (RSV, Flu A&B, Covid) Anterior Nasal Swab     Status: None   Collection Time: 08/22/22  7:46 PM   Specimen: Anterior Nasal Swab  Result Value Ref Range Status   SARS Coronavirus 2 by RT PCR NEGATIVE NEGATIVE Final    Comment: (NOTE) SARS-CoV-2 target nucleic acids are NOT DETECTED.  The SARS-CoV-2 RNA is generally detectable in upper respiratory specimens during the acute phase of infection. The lowest concentration of SARS-CoV-2 viral copies this assay can detect is 138 copies/mL. A negative result does not preclude SARS-Cov-2 infection and should not be used as the sole basis for treatment or other patient management decisions. A negative result may occur with  improper specimen collection/handling, submission of specimen other than nasopharyngeal swab, presence of viral mutation(s) within the areas targeted by this assay, and inadequate number of viral copies(<138 copies/mL). A negative result must be combined with clinical observations, patient history, and epidemiological information. The expected result is Negative.  Fact Sheet for Patients:  EntrepreneurPulse.com.au  Fact Sheet for Healthcare  Providers:  IncredibleEmployment.be  This test is no t yet approved or cleared by the [place] FDA and  has been authorized for detection and/or diagnosis of SARS-CoV-2 by FDA under an Emergency Use Authorization (EUA). This EUA will remain  in effect (meaning this test can be used) for the duration of the COVID-19 declaration under Section 564(b)(1) of the Act, 21 U.S.C.section 360bbb-3(b)(1), unless the authorization is terminated  or revoked sooner.       Influenza A by PCR NEGATIVE NEGATIVE Final   Influenza B by PCR NEGATIVE NEGATIVE Final    Comment: (NOTE) The Xpert Xpress SARS-CoV-2/FLU/RSV plus assay is intended as an aid in the diagnosis of influenza from Nasopharyngeal swab specimens and should not be used as a sole basis for treatment. Nasal washings and aspirates are unacceptable for Xpert Xpress SARS-CoV-2/FLU/RSV testing.  Fact Sheet for Patients: EntrepreneurPulse.com.au  Fact Sheet for Healthcare Providers: IncredibleEmployment.be  This test is not yet approved or cleared by the [place] FDA and has been  authorized for detection and/or diagnosis of SARS-CoV-2 by FDA under an Emergency Use Authorization (EUA). This EUA will remain in effect (meaning this test can be used) for the duration of the COVID-19 declaration under Section 564(b)(1) of the Act, 21 U.S.C. section 360bbb-3(b)(1), unless the authorization is terminated or revoked.     Resp Syncytial Virus by PCR NEGATIVE NEGATIVE Final    Comment: (NOTE) Fact Sheet for Patients: EntrepreneurPulse.com.au  Fact Sheet for Healthcare Providers: IncredibleEmployment.be  This test is not yet approved or cleared by the [place] FDA and has been authorized for detection and/or diagnosis of SARS-CoV-2 by FDA under an Emergency Use Authorization (EUA). This EUA will remain in effect (meaning this test can be  used) for the duration of the COVID-19 declaration under Section 564(b)(1) of the Act, 21 U.S.C. section 360bbb-3(b)(1), unless the authorization is terminated or revoked.  Performed at [facility], [place] [address], [place], [place] [address]          Radiology Studies: CT ABDOMEN PELVIS W CONTRAST  Result Date: 08/22/2022 CLINICAL DATA:  Crohn's exacerbation. EXAM: CT ABDOMEN AND PELVIS WITH CONTRAST TECHNIQUE: Multidetector CT imaging of the abdomen and pelvis was performed using the standard protocol following bolus administration of intravenous contrast. RADIATION DOSE REDUCTION: This exam was performed according to the departmental dose-optimization program which includes automated exposure control, adjustment of the mA and/or kV according to patient size and/or use of iterative reconstruction technique. CONTRAST:  163m OMNIPAQUE IOHEXOL 300 MG/ML  SOLN COMPARISON:  CT abdomen pelvis dated 04/20/2020. FINDINGS: Lower chest: The visualized lung bases are clear. No intra-abdominal free air.  Small free fluid in the pelvis. Hepatobiliary: Several subcentimeter hepatic hypodense lesions are too small to characterize. No biliary dilatation. Small gallstone. No pericholecystic fluid or evidence of acute cholecystitis by CT. Pancreas: Unremarkable. No pancreatic ductal dilatation or surrounding inflammatory changes. Spleen: Normal in size without focal abnormality. Adrenals/Urinary Tract: Mild haziness of the adrenal gland similar to prior CT. There is no hydronephrosis on either side. The visualized ureters and urinary bladder appear unremarkable. Stomach/Bowel: There is mild diffuse thickened appearance of the colon with submucosal fat deposit. Findings may be partly related to underdistention or sequela of chronic inflammation. Mild colitis is not excluded clinical correlation is recommended. No definite inflammatory changes of the small bowel. There is no bowel obstruction. The  appendix is normal. Vascular/Lymphatic: The abdominal aorta and IVC are unremarkable. No portal venous gas. There is no adenopathy. Reproductive: The uterus is anteverted. Multiple uterine fibroids noted. A tampon is noted in the vagina. No adnexal masses. Other: Small fat containing umbilical hernia. Musculoskeletal: No acute or significant osseous findings. IMPRESSION: 1. Underdistention of the colon or sequela of chronic inflammation. Mild colitis is not excluded clinical correlation is recommended. No bowel obstruction. Normal appendix. 2. Cholelithiasis. 3. Uterine fibroids. Electronically Signed   By: AAnner CreteM.D.   On: 08/22/2022 21:48        Scheduled Meds:  calcium-vitamin D  2 tablet Oral BID   enoxaparin (LOVENOX) injection  40 mg Subcutaneous Q24H   escitalopram  20 mg Oral Daily   insulin aspart  0-5 Units Subcutaneous QHS   insulin aspart  0-9 Units Subcutaneous TID WC   levothyroxine  100 mcg Oral Q0600   pantoprazole  40 mg Oral Daily   Continuous Infusions:  lactated ringers 1,000 mL with potassium chloride 40 mEq infusion 75 mL/hr at 08/23/22 0511   potassium chloride 10 mEq (08/23/22 0647)  LOS: 1 day    Time spent: 35 minutes    Sharlisa Hollifield A Zykeria Laguardia, MD [facility]   If 7PM-7AM, please contact night-coverage [email or web address]  08/23/2022, 8:07 AM

## 2022-08-24 DIAGNOSIS — Z8719 Personal history of other diseases of the digestive system: Secondary | ICD-10-CM

## 2022-08-24 DIAGNOSIS — K529 Noninfective gastroenteritis and colitis, unspecified: Secondary | ICD-10-CM | POA: Diagnosis not present

## 2022-08-24 LAB — BASIC METABOLIC PANEL
Anion gap: 10 (ref 5–15)
BUN: <5 mg/dL — ABNORMAL LOW (ref 6–20)
CO2: 24 mmol/L (ref 22–32)
Calcium: 9.6 mg/dL (ref 8.9–10.3)
Chloride: 106 mmol/L (ref 98–111)
Creatinine, Ser: 0.96 mg/dL (ref 0.44–1.00)
GFR, Estimated: >60 mL/min (ref >60)
Glucose, Bld: 130 mg/dL — ABNORMAL HIGH (ref 70–99)
Potassium: 4.2 mmol/L (ref 3.5–5.1)
Sodium: 140 mmol/L (ref 135–145)

## 2022-08-24 LAB — HIV ANTIBODY (ROUTINE TESTING W REFLEX): HIV Screen 4th Generation wRfx: NONREACTIVE

## 2022-08-24 LAB — CBC
HCT: 34.2 % — ABNORMAL LOW (ref 36.0–46.0)
Hemoglobin: 9.7 g/dL — ABNORMAL LOW (ref 12.0–15.0)
MCH: 21.5 pg — ABNORMAL LOW (ref 26.0–34.0)
MCHC: 28.4 g/dL — ABNORMAL LOW (ref 30.0–36.0)
MCV: 75.8 fL — ABNORMAL LOW (ref 80.0–100.0)
Platelets: 509 10*3/uL — ABNORMAL HIGH (ref 150–400)
RBC: 4.51 MIL/uL (ref 3.87–5.11)
RDW: 19.3 % — ABNORMAL HIGH (ref 11.5–15.5)
WBC: 7.1 10*3/uL (ref 4.0–10.5)
nRBC: 0 % (ref 0.0–0.2)

## 2022-08-24 LAB — GLUCOSE, CAPILLARY
Glucose-Capillary: 166 mg/dL — ABNORMAL HIGH (ref 70–99)
Glucose-Capillary: 133 mg/dL — ABNORMAL HIGH (ref 70–99)

## 2022-08-24 MED ORDER — PANTOPRAZOLE SODIUM 40 MG PO TBEC: 40.0000 mg | DELAYED_RELEASE_TABLET | Freq: Every day | ORAL | 0 refills | Status: DC

## 2022-08-24 MED ORDER — ONDANSETRON 4 MG PO TBDP: 4.0000 mg | ORAL_TABLET | Freq: Three times a day (TID) | ORAL | 0 refills | Status: DC | PRN

## 2022-08-24 MED ORDER — FERROUS SULFATE 325 (65 FE) MG PO TABS: 325.0000 mg | ORAL_TABLET | Freq: Every day | ORAL | 3 refills | Status: AC

## 2022-08-24 MED ORDER — FERROUS SULFATE 325 (65 FE) MG PO TABS: 325.0000 mg | ORAL_TABLET | Freq: Every day | ORAL | Status: DC

## 2022-08-24 NOTE — Progress Notes (Signed)
Patient ID: Melanie [name], female   DOB: 1986-05-20, 37 y.o.   MRN: [id number]    Progress Note   Subjective   Day # 2 CC; nausea vomiting diarrhea, history of ileal Crohn's remote  Last colonoscopy 2014/[facility]-ileal erosions and ulcerations, normal-appearing colon.  Biopsies showed chronic active ileitis, colonic biopsies negative, no evidence for active colitis  Ferritin 2/iron 38/TIBC 696/sat 6 Today's labs pending  Patient says she feels a lot better, about back to normal.  She was able to tolerate solid food for breakfast without any difficulty nausea and vomiting and diarrhea have all resolved she has no complaints of abdominal pain and would like to go home   Objective   Vital signs in last 24 hours: Temp:  [98 F (36.7 C)-98.9 F (37.2 C)] 98.8 F (37.1 C) (02/13 0630) Pulse Rate:  [61-72] 63 (02/13 0630) Resp:  [14-20] 17 (02/13 0630) BP: (123-156)/(68-95) 123/78 (02/13 0630) SpO2:  [95 %-100 %] 99 % (02/13 0630) Last BM Date : 08/23/22 General:   AA female in NAD Heart:  Regular rate and rhythm; no murmurs Lungs: Respirations even and unlabored, lungs CTA bilaterally Abdomen:  Soft, nontender and nondistended. Normal bowel sounds. Extremities:  Without edema. Neurologic:  Alert and oriented,  grossly normal neurologically. Psych:  Cooperative. Normal mood and affect.  Intake/Output from previous day: 02/12 0701 - 02/13 0700 In: 1633.1 [P.O.:709; I.V.:585.1; IV Piggyback:339] Out: 1 [Stool:1] Intake/Output this shift: No intake/output data recorded.  Lab Results: Recent Labs    08/22/22 1940 08/23/22 0515  WBC 8.7 11.2*  HGB 9.6* 8.6*  HCT 34.1* 29.5*  PLT 498* 435*   BMET Recent Labs    08/22/22 1940 08/23/22 0515 08/23/22 1440  NA 139 136 137  K 2.4* 2.7* 3.5  CL 104 100 102  CO2 16* 25 21*  GLUCOSE 250* 184* 155*  BUN 10 6 <5*  CREATININE 0.88 0.73 0.91  CALCIUM 8.9 8.8* 9.3   LFT Recent Labs    08/23/22 0515  PROT 8.0  ALBUMIN  3.9  AST 29  ALT 14  ALKPHOS 74  BILITOT 0.6   PT/INR No results for input(s): "LABPROT", "INR" in the last 72 hours.  Studies/Results: CT ABDOMEN PELVIS W CONTRAST  Result Date: 08/22/2022 CLINICAL DATA:  Crohn's exacerbation. EXAM: CT ABDOMEN AND PELVIS WITH CONTRAST TECHNIQUE: Multidetector CT imaging of the abdomen and pelvis was performed using the standard protocol following bolus administration of intravenous contrast. RADIATION DOSE REDUCTION: This exam was performed according to the departmental dose-optimization program which includes automated exposure control, adjustment of the mA and/or kV according to patient size and/or use of iterative reconstruction technique. CONTRAST:  141m OMNIPAQUE IOHEXOL 300 MG/ML  SOLN COMPARISON:  CT abdomen pelvis dated 04/20/2020. FINDINGS: Lower chest: The visualized lung bases are clear. No intra-abdominal free air.  Small free fluid in the pelvis. Hepatobiliary: Several subcentimeter hepatic hypodense lesions are too small to characterize. No biliary dilatation. Small gallstone. No pericholecystic fluid or evidence of acute cholecystitis by CT. Pancreas: Unremarkable. No pancreatic ductal dilatation or surrounding inflammatory changes. Spleen: Normal in size without focal abnormality. Adrenals/Urinary Tract: Mild haziness of the adrenal gland similar to prior CT. There is no hydronephrosis on either side. The visualized ureters and urinary bladder appear unremarkable. Stomach/Bowel: There is mild diffuse thickened appearance of the colon with submucosal fat deposit. Findings may be partly related to underdistention or sequela of chronic inflammation. Mild colitis is not excluded clinical correlation is recommended. No definite inflammatory changes  of the small bowel. There is no bowel obstruction. The appendix is normal. Vascular/Lymphatic: The abdominal aorta and IVC are unremarkable. No portal venous gas. There is no adenopathy. Reproductive: The uterus  is anteverted. Multiple uterine fibroids noted. A tampon is noted in the vagina. No adnexal masses. Other: Small fat containing umbilical hernia. Musculoskeletal: No acute or significant osseous findings. IMPRESSION: 1. Underdistention of the colon or sequela of chronic inflammation. Mild colitis is not excluded clinical correlation is recommended. No bowel obstruction. Normal appendix. 2. Cholelithiasis. 3. Uterine fibroids. Electronically Signed   By: Anner Crete M.D.   On: 08/22/2022 21:48       Assessment / Plan:    #39 37 year old African-American female with history of ileal Crohn's disease who has been asymptomatic and not on any therapy for many years with last colonoscopy done in 2014, who presented with 1 day history of acute abdominal pain cramping nausea vomiting diaphoresis and diarrhea  Picture is most consistent with an acute infectious gastroenteritis which has rapidly resolved  CT scan showed under distention of the colon or possible sequelae of chronic inflammation cannot exclude mild colitis no evidence of ileitis  These findings may be secondary to an acute gastroenteritis.   #2 electrolyte derangement secondary to above- Improved  #3 iron deficiency anemia-not new felt secondary to menorrhagia, multiple uterine fibroids on CT   Plan; she is okay for discharge from GI perspective Our office will arrange a follow-up office visit with Dr. Carlean Purl or myself in a few weeks, can reassess at that time and discuss possible colonoscopy to assess for any active Crohn's disease  Please start oral iron supplement ferrous sulfate 325 p.o. twice daily with food.  She should follow-up with gynecology if she has not done so recently.     Principal Problem:   Colitis     LOS: 2 days   Jacquis Paxton PA-C 08/24/2022, 8:45 AM

## 2022-08-24 NOTE — Discharge Summary (Addendum)
Physician Discharge Summary   Patient: Melanie [name] MRN: [id number] DOB: 06-18-1986  Admit date:     08/22/2022  Discharge date: 08/24/22  Discharge Physician: Elmarie Shiley   PCP: Nolene Ebbs, MD   Recommendations at discharge:   Needs follow up with GYN for management of menorrhagia.  Follow up with GI for colonoscopy   Discharge Diagnoses: Principal Problem:   Gastroenteritis Active Problems:   Dehydration   Hypokalemia   Crohn's disease (HCC)   Intractable nausea and vomiting  Resolved Problems:   * No resolved hospital problems. *  Hospital Course: 37 year old with past medical history significant for Crohn's disease not on medical therapy, diabetes type 2 on diet, hypothyroidism, chronic anxiety/depression, GERD presents complaining of sudden onset of nausea vomiting diarrhea that is started the night of admission.  CT abdomen and pelvis showed findings suggestive of possible mild colitis.  Negative for obstruction.  [facility] has been consulted.   Assessment and Plan:   1-Mild colitis vs  gastroenteritis.:  Continue with support care.  IV fluids.  PRN zofran Protonix.  Morphine PRN pain Resolved with support care. Tolerating diet.   Crohn's Diseases Follow up out patient with Dr Carlean Purl for colonoscopy   Lactic acidosis; in setting vomiting, dehydration Continue with IV fluids.  Lactic acid decrease to 2 from 5. Resolved.   Hypokalemia, Severe Replaced.    Hypomagnesemia: Replaced.    High anion gap metabolic acidosis in the setting of lactic acidosis Resolved  with IV fluids.    Anemia in the setting of menorrhagia.  Anemia panel.  Needs follow up with GYN Discharge on ferrous sulfate.   Diet-controlled diabetes type 2 with hyperglycemia SSI   Chronic anxiety depression On Lexapro   Hypothyroidism: Continue with synthroid   GERD; PPI         Consultants: GI Procedures performed: None Disposition: Home Diet  recommendation:  Discharge Diet Orders (From admission, onward)     Start     Ordered   08/24/22 0000  Diet - low sodium heart healthy        08/24/22 1240           Regular diet DISCHARGE MEDICATION: Allergies as of 08/24/2022   No Known Allergies      Medication List     STOP taking these medications    HYDROcodone-acetaminophen 7.5-325 MG tablet Commonly known as: Norco   omeprazole 20 MG capsule Commonly known as: PRILOSEC Replaced by: pantoprazole 40 MG tablet       TAKE these medications    calcium-vitamin D 500-5 MG-MCG tablet Commonly known as: OSCAL WITH D Take 2 tablets by mouth 2 (two) times daily.   escitalopram 20 MG tablet Commonly known as: LEXAPRO Take 20 mg by mouth daily.   ferrous sulfate 325 (65 FE) MG tablet Take 1 tablet (325 mg total) by mouth daily with breakfast. Start taking on: August 25, 2022   hydrOXYzine 25 MG tablet Commonly known as: ATARAX Take 25 mg by mouth at bedtime as needed (sleep).   levothyroxine 100 MCG tablet Commonly known as: Synthroid Take 1 tablet (100 mcg total) by mouth daily.   ondansetron 4 MG disintegrating tablet Commonly known as: ZOFRAN-ODT Take 1 tablet (4 mg total) by mouth every 8 (eight) hours as needed for nausea or vomiting.   pantoprazole 40 MG tablet Commonly known as: PROTONIX Take 1 tablet (40 mg total) by mouth daily. Start taking on: August 25, 2022 Replaces: omeprazole 20 MG capsule  Vitamin D (Ergocalciferol) 1.25 MG (50000 UNIT) Caps capsule Commonly known as: DRISDOL Take 50,000 Units by mouth once a week.        Follow-up Information     Nolene Ebbs, MD Follow up in 1 week(s).   Specialty: Internal Medicine Contact information: [address] [place] [address] [phone]         Gatha Mayer, MD Follow up in 1 week(s).   Specialty: Gastroenterology Contact information: [address] [place] [address] [phone]                 Discharge Exam: Danley Danker Weights   08/22/22 1927  Weight: 61.2 kg   General; NAD  Condition at discharge: stable  The results of significant diagnostics from this hospitalization (including imaging, microbiology, ancillary and laboratory) are listed below for reference.   Imaging Studies: CT ABDOMEN PELVIS W CONTRAST  Result Date: 08/22/2022 CLINICAL DATA:  Crohn's exacerbation. EXAM: CT ABDOMEN AND PELVIS WITH CONTRAST TECHNIQUE: Multidetector CT imaging of the abdomen and pelvis was performed using the standard protocol following bolus administration of intravenous contrast. RADIATION DOSE REDUCTION: This exam was performed according to the departmental dose-optimization program which includes automated exposure control, adjustment of the mA and/or kV according to patient size and/or use of iterative reconstruction technique. CONTRAST:  126m OMNIPAQUE IOHEXOL 300 MG/ML  SOLN COMPARISON:  CT abdomen pelvis dated 04/20/2020. FINDINGS: Lower chest: The visualized lung bases are clear. No intra-abdominal free air.  Small free fluid in the pelvis. Hepatobiliary: Several subcentimeter hepatic hypodense lesions are too small to characterize. No biliary dilatation. Small gallstone. No pericholecystic fluid or evidence of acute cholecystitis by CT. Pancreas: Unremarkable. No pancreatic ductal dilatation or surrounding inflammatory changes. Spleen: Normal in size without focal abnormality. Adrenals/Urinary Tract: Mild haziness of the adrenal gland similar to prior CT. There is no hydronephrosis on either side. The visualized ureters and urinary bladder appear unremarkable. Stomach/Bowel: There is mild diffuse thickened appearance of the colon with submucosal fat deposit. Findings may be partly related to underdistention or sequela of chronic inflammation. Mild colitis is not excluded clinical correlation is recommended. No definite inflammatory changes of the small bowel. There is no bowel obstruction.  The appendix is normal. Vascular/Lymphatic: The abdominal aorta and IVC are unremarkable. No portal venous gas. There is no adenopathy. Reproductive: The uterus is anteverted. Multiple uterine fibroids noted. A tampon is noted in the vagina. No adnexal masses. Other: Small fat containing umbilical hernia. Musculoskeletal: No acute or significant osseous findings. IMPRESSION: 1. Underdistention of the colon or sequela of chronic inflammation. Mild colitis is not excluded clinical correlation is recommended. No bowel obstruction. Normal appendix. 2. Cholelithiasis. 3. Uterine fibroids. Electronically Signed   By: AAnner CreteM.D.   On: 08/22/2022 21:48    Microbiology: Results for orders placed or performed during the hospital encounter of 08/22/22  Resp panel by RT-PCR (RSV, Flu A&B, Covid) Anterior Nasal Swab     Status: None   Collection Time: 08/22/22  7:46 PM   Specimen: Anterior Nasal Swab  Result Value Ref Range Status   SARS Coronavirus 2 by RT PCR NEGATIVE NEGATIVE Final    Comment: (NOTE) SARS-CoV-2 target nucleic acids are NOT DETECTED.  The SARS-CoV-2 RNA is generally detectable in upper respiratory specimens during the acute phase of infection. The lowest concentration of SARS-CoV-2 viral copies this assay can detect is 138 copies/mL. A negative result does not preclude SARS-Cov-2 infection and should not be used as the sole basis  for treatment or other patient management decisions. A negative result may occur with  improper specimen collection/handling, submission of specimen other than nasopharyngeal swab, presence of viral mutation(s) within the areas targeted by this assay, and inadequate number of viral copies(<138 copies/mL). A negative result must be combined with clinical observations, patient history, and epidemiological information. The expected result is Negative.  Fact Sheet for Patients:  EntrepreneurPulse.com.au  Fact Sheet for Healthcare  Providers:  IncredibleEmployment.be  This test is no t yet approved or cleared by the [place] FDA and  has been authorized for detection and/or diagnosis of SARS-CoV-2 by FDA under an Emergency Use Authorization (EUA). This EUA will remain  in effect (meaning this test can be used) for the duration of the COVID-19 declaration under Section 564(b)(1) of the Act, 21 U.S.C.section 360bbb-3(b)(1), unless the authorization is terminated  or revoked sooner.       Influenza A by PCR NEGATIVE NEGATIVE Final   Influenza B by PCR NEGATIVE NEGATIVE Final    Comment: (NOTE) The Xpert Xpress SARS-CoV-2/FLU/RSV plus assay is intended as an aid in the diagnosis of influenza from Nasopharyngeal swab specimens and should not be used as a sole basis for treatment. Nasal washings and aspirates are unacceptable for Xpert Xpress SARS-CoV-2/FLU/RSV testing.  Fact Sheet for Patients: EntrepreneurPulse.com.au  Fact Sheet for Healthcare Providers: IncredibleEmployment.be  This test is not yet approved or cleared by the [place] FDA and has been authorized for detection and/or diagnosis of SARS-CoV-2 by FDA under an Emergency Use Authorization (EUA). This EUA will remain in effect (meaning this test can be used) for the duration of the COVID-19 declaration under Section 564(b)(1) of the Act, 21 U.S.C. section 360bbb-3(b)(1), unless the authorization is terminated or revoked.     Resp Syncytial Virus by PCR NEGATIVE NEGATIVE Final    Comment: (NOTE) Fact Sheet for Patients: EntrepreneurPulse.com.au  Fact Sheet for Healthcare Providers: IncredibleEmployment.be  This test is not yet approved or cleared by the [place] FDA and has been authorized for detection and/or diagnosis of SARS-CoV-2 by FDA under an Emergency Use Authorization (EUA). This EUA will remain in effect (meaning this test can be  used) for the duration of the COVID-19 declaration under Section 564(b)(1) of the Act, 21 U.S.C. section 360bbb-3(b)(1), unless the authorization is terminated or revoked.  Performed at [facility], [place] [address] [place], [place] [address]   Culture, blood (Routine X 2) w Reflex to ID Panel     Status: None (Preliminary result)   Collection Time: 08/23/22  5:10 AM   Specimen: BLOOD  Result Value Ref Range Status   Specimen Description   Final    BLOOD BLOOD RIGHT FOREARM Performed at [place] [address] [place], [place] [address]    Special Requests   Final    BOTTLES DRAWN AEROBIC AND ANAEROBIC Blood Culture adequate volume Performed at [facility] [address], [place], [place] [address]    Culture   Final    NO GROWTH 1 DAY Performed at [facility], [place] [address], [place], [place] [address]    Report Status PENDING  Incomplete  Culture, blood (Routine X 2) w Reflex to ID Panel     Status: None (Preliminary result)   Collection Time: 08/23/22  5:15 AM   Specimen: BLOOD  Result Value Ref Range Status   Specimen Description   Final    BLOOD LEFT ANTECUBITAL Performed at [facility],  [place] [address] [place], [place] [address]    Special Requests   Final    BOTTLES DRAWN AEROBIC AND ANAEROBIC Blood Culture adequate volume Performed at [facility] [address] [place], [place] [address]    Culture   Final    NO GROWTH 1 DAY Performed at [facility], [place] [address] [place], [place] [address]    Report Status PENDING  Incomplete  MRSA Next Gen by PCR, Nasal     Status: None   Collection Time: 08/23/22  1:24 PM   Specimen: Nasal Mucosa; Nasal Swab  Result Value Ref Range Status   MRSA by PCR Next Gen NOT DETECTED NOT DETECTED Final    Comment: (NOTE) The GeneXpert MRSA Assay (FDA approved for NASAL specimens only), is one component of a  comprehensive MRSA colonization surveillance program. It is not intended to diagnose MRSA infection nor to guide or monitor treatment for MRSA infections. Test performance is not FDA approved in patients less than 66 years old. Performed at [facility], [place] [address], [place], [place] [address]     Labs: CBC: Recent Labs  Lab 08/22/22 1940 08/23/22 0515 08/24/22 0925  WBC 8.7 11.2* 7.1  NEUTROABS 5.6  --   --   HGB 9.6* 8.6* 9.7*  HCT 34.1* 29.5* 34.2*  MCV 78.0* 74.3* 75.8*  PLT 498* 435* [id number]*   Basic Metabolic Panel: Recent Labs  Lab 08/22/22 1940 08/22/22 2230 08/23/22 0515 08/23/22 1440 08/24/22 0925  NA 139  --  136 137 140  K 2.4*  --  2.7* 3.5 4.2  CL 104  --  100 102 106  CO2 16*  --  25 21* 24  GLUCOSE 250*  --  184* 155* 130*  BUN 10  --  6 <5* <5*  CREATININE 0.88  --  0.73 0.91 0.96  CALCIUM 8.9  --  8.8* 9.3 9.6  MG  --  1.4* 2.1  --   --   PHOS  --   --  3.0  --   --    Liver Function Tests: Recent Labs  Lab 08/22/22 1940 08/23/22 0515  AST 23 29  ALT 14 14  ALKPHOS 74 74  BILITOT 0.3 0.6  PROT 8.0 8.0  ALBUMIN 3.9 3.9   CBG: Recent Labs  Lab 08/23/22 1254 08/23/22 1640 08/23/22 2157 08/24/22 0802 08/24/22 1141  GLUCAP 153* 137* 176* 166* 133*    Discharge time spent: greater than 30 minutes.  Signed: Elmarie Shiley, MD [facility] 08/24/2022

## 2022-08-26 ENCOUNTER — Telehealth: Payer: Self-pay

## 2022-08-26 NOTE — Telephone Encounter (Signed)
Contact the patient and scheduled her follow up appointment.

## 2022-08-26 NOTE — Telephone Encounter (Signed)
-----   Message from Melanie Ferguson, PA-C sent at 08/24/2022 10:35 AM EST ----- Regarding: office follow up Beth, this patient was seen in consultation during hospitalization for an acute gastroenteritis.  She has remote history of Crohn's ileitis  Please get her an appointment with Dr. Carlean Purl or myself in about 4 weeks.  Need to discuss possible colonoscopy  Okay to call her later today or tomorrow without appointment thank you

## 2022-08-28 LAB — CULTURE, BLOOD (ROUTINE X 2)
Culture: NO GROWTH
Culture: NO GROWTH
Special Requests: ADEQUATE
Special Requests: ADEQUATE

## 2022-09-22 ENCOUNTER — Ambulatory Visit: Payer: Medicaid Other | Admitting: Internal Medicine

## 2022-10-28 ENCOUNTER — Other Ambulatory Visit: Payer: Self-pay

## 2022-10-28 ENCOUNTER — Emergency Department (HOSPITAL_COMMUNITY)
Admission: EM | Admit: 2022-10-28 | Discharge: 2022-10-28 | Disposition: A | Discharge status: Home / Self Care | Payer: Medicaid Other | Attending: Emergency Medicine | Admitting: Emergency Medicine

## 2022-10-28 ENCOUNTER — Emergency Department (HOSPITAL_COMMUNITY): Payer: Medicaid Other

## 2022-10-28 ENCOUNTER — Encounter (HOSPITAL_COMMUNITY): Payer: Self-pay | Admitting: Emergency Medicine

## 2022-10-28 DIAGNOSIS — N858 Other specified noninflammatory disorders of uterus: Secondary | ICD-10-CM | POA: Insufficient documentation

## 2022-10-28 DIAGNOSIS — R112 Nausea with vomiting, unspecified: Secondary | ICD-10-CM | POA: Insufficient documentation

## 2022-10-28 DIAGNOSIS — F10920 Alcohol use, unspecified with intoxication, uncomplicated: Secondary | ICD-10-CM

## 2022-10-28 DIAGNOSIS — R1084 Generalized abdominal pain: Secondary | ICD-10-CM | POA: Insufficient documentation

## 2022-10-28 DIAGNOSIS — Y906 Blood alcohol level of 120-199 mg/100 ml: Secondary | ICD-10-CM | POA: Diagnosis not present

## 2022-10-28 DIAGNOSIS — F1090 Alcohol use, unspecified, uncomplicated: Secondary | ICD-10-CM | POA: Diagnosis not present

## 2022-10-28 LAB — COMPREHENSIVE METABOLIC PANEL
ALT: 39 U/L (ref 0–44)
AST: 74 U/L — ABNORMAL HIGH (ref 15–41)
Albumin: 4 g/dL (ref 3.5–5.0)
Alkaline Phosphatase: 84 U/L (ref 38–126)
Anion gap: 20 — ABNORMAL HIGH (ref 5–15)
BUN: 11 mg/dL (ref 6–20)
CO2: 15 mmol/L — ABNORMAL LOW (ref 22–32)
Calcium: 9.1 mg/dL (ref 8.9–10.3)
Chloride: 108 mmol/L (ref 98–111)
Creatinine, Ser: 1.26 mg/dL — ABNORMAL HIGH (ref 0.44–1.00)
GFR, Estimated: 57 mL/min — ABNORMAL LOW (ref >60)
Glucose, Bld: 258 mg/dL — ABNORMAL HIGH (ref 70–99)
Potassium: 3.2 mmol/L — ABNORMAL LOW (ref 3.5–5.1)
Sodium: 143 mmol/L (ref 135–145)
Total Bilirubin: 0.5 mg/dL (ref 0.3–1.2)
Total Protein: 7.8 g/dL (ref 6.5–8.1)

## 2022-10-28 LAB — CBC
HCT: 31.5 % — ABNORMAL LOW (ref 36.0–46.0)
Hemoglobin: 9.4 g/dL — ABNORMAL LOW (ref 12.0–15.0)
MCH: 21.7 pg — ABNORMAL LOW (ref 26.0–34.0)
MCHC: 29.8 g/dL — ABNORMAL LOW (ref 30.0–36.0)
MCV: 72.6 fL — ABNORMAL LOW (ref 80.0–100.0)
Platelets: 447 10*3/uL — ABNORMAL HIGH (ref 150–400)
RBC: 4.34 MIL/uL (ref 3.87–5.11)
RDW: 18.8 % — ABNORMAL HIGH (ref 11.5–15.5)
WBC: 12.3 10*3/uL — ABNORMAL HIGH (ref 4.0–10.5)
nRBC: 0 % (ref 0.0–0.2)

## 2022-10-28 LAB — CBG MONITORING, ED: Glucose-Capillary: 200 mg/dL — ABNORMAL HIGH (ref 70–99)

## 2022-10-28 LAB — I-STAT BETA HCG BLOOD, ED (MC, WL, AP ONLY): I-stat hCG, quantitative: <5 m[IU]/mL (ref <5)

## 2022-10-28 LAB — LIPASE, BLOOD: Lipase: 82 U/L — ABNORMAL HIGH (ref 11–51)

## 2022-10-28 LAB — ETHANOL: Alcohol, Ethyl (B): 134 mg/dL — ABNORMAL HIGH (ref <10)

## 2022-10-28 MED ORDER — KETOROLAC TROMETHAMINE 30 MG/ML IJ SOLN
15.0000 mg | Freq: Once | INTRAMUSCULAR | Status: AC
  Administered 2022-10-28: 15 mg via INTRAVENOUS
  Filled 2022-10-28: qty 1

## 2022-10-28 MED ORDER — SUCRALFATE 1 G PO TABS: 1.0000 g | ORAL_TABLET | Freq: Three times a day (TID) | ORAL | 0 refills | Status: AC

## 2022-10-28 MED ORDER — IOHEXOL 350 MG/ML SOLN
75.0000 mL | Freq: Once | INTRAVENOUS | Status: AC | PRN
  Administered 2022-10-28: 75 mL via INTRAVENOUS

## 2022-10-28 MED ORDER — LACTATED RINGERS IV BOLUS: 1000.0000 mL | Freq: Once | INTRAVENOUS | Status: DC

## 2022-10-28 MED ORDER — HYOSCYAMINE SULFATE 0.125 MG SL SUBL: 0.1250 mg | SUBLINGUAL_TABLET | SUBLINGUAL | 0 refills | Status: AC | PRN

## 2022-10-28 MED ORDER — PANTOPRAZOLE SODIUM 40 MG PO TBEC: 40.0000 mg | DELAYED_RELEASE_TABLET | Freq: Every day | ORAL | 3 refills | Status: AC

## 2022-10-28 MED ORDER — POTASSIUM CHLORIDE CRYS ER 20 MEQ PO TBCR
40.0000 meq | EXTENDED_RELEASE_TABLET | Freq: Once | ORAL | Status: AC
  Administered 2022-10-28: 40 meq via ORAL
  Filled 2022-10-28: qty 2

## 2022-10-28 MED ORDER — HALOPERIDOL LACTATE 5 MG/ML IJ SOLN
2.0000 mg | Freq: Once | INTRAMUSCULAR | Status: AC
  Administered 2022-10-28: 2 mg via INTRAVENOUS
  Filled 2022-10-28: qty 1

## 2022-10-28 MED ORDER — LACTATED RINGERS IV BOLUS
1000.0000 mL | Freq: Once | INTRAVENOUS | Status: AC
  Administered 2022-10-28: 1000 mL via INTRAVENOUS

## 2022-10-28 MED ORDER — ONDANSETRON 4 MG PO TBDP: ORAL_TABLET | ORAL | 0 refills | Status: AC

## 2022-10-28 MED ORDER — METOCLOPRAMIDE HCL 5 MG/ML IJ SOLN
10.0000 mg | Freq: Once | INTRAMUSCULAR | Status: AC
  Administered 2022-10-28: 10 mg via INTRAVENOUS
  Filled 2022-10-28: qty 2

## 2022-10-28 MED ORDER — ONDANSETRON 4 MG PO TBDP
4.0000 mg | ORAL_TABLET | Freq: Once | ORAL | Status: AC | PRN
  Administered 2022-10-28: 4 mg via ORAL
  Filled 2022-10-28: qty 1

## 2022-10-28 NOTE — ED Notes (Signed)
Pt is diaphoretic and pale/ MD made aware

## 2022-10-28 NOTE — ED Provider Notes (Signed)
[place] EMERGENCY DEPARTMENT AT [facility] Provider Note   CSN: [id number] Arrival date & time: 10/28/22  0145     History  Chief Complaint  Patient presents with   Emesis    Melanie [name] is a 37 y.o. female.  Patient presents to the emergency department for evaluation of nausea, vomiting, generalized abdominal pain.  Patient does report that she has been eating alcohol tonight.  Patient has not had URI symptoms or diarrhea.  There is no fever.       Home Medications Prior to Admission medications   Medication Sig Start Date End Date Taking? Authorizing Provider  hyoscyamine (LEVSIN SL) 0.125 MG SL tablet Place 1 tablet (0.125 mg total) under the tongue every 4 (four) hours as needed for cramping (abdominal pain). 10/28/22  Yes Gilda Crease, MD  ondansetron (ZOFRAN-ODT) 4 MG disintegrating tablet  ODT q4 hours prn nausea/vomit 10/28/22  Yes Sinjin Amero, Canary Brim, MD  pantoprazole (PROTONIX) 40 MG tablet Take 1 tablet (40 mg total) by mouth daily. 10/28/22  Yes Treasure Ingrum, Canary Brim, MD  sucralfate (CARAFATE) 1 g tablet Take 1 tablet (1 g total) by mouth 4 (four) times daily -  with meals and at bedtime. 10/28/22  Yes Lakota Markgraf, Canary Brim, MD  calcium-vitamin D (OSCAL WITH D) 500-5 MG-MCG tablet Take 2 tablets by mouth 2 (two) times daily. 02/06/22 03/08/22  Skotnicki, Meghan A, DO  escitalopram (LEXAPRO) 20 MG tablet Take 20 mg by mouth daily. 01/14/22   [provider]  ferrous sulfate 325 (65 FE) MG tablet Take 1 tablet (325 mg total) by mouth daily with breakfast. 08/25/22   Regalado, Belkys A, MD  hydrOXYzine (ATARAX) 25 MG tablet Take 25 mg by mouth at bedtime as needed (sleep). 06/26/21   [provider]  levothyroxine (SYNTHROID) 100 MCG tablet Take 1 tablet (100 mcg total) by mouth daily. 02/05/22   Serena Colonel, MD  Vitamin D, Ergocalciferol, (DRISDOL) 1.25 MG (50000 UNIT) CAPS capsule Take 50,000 Units by mouth once a week.  04/19/20   [provider]      Allergies    Patient has no known allergies.    Review of Systems   Review of Systems  Physical Exam Updated Vital Signs BP 125/78 (BP Location: Right Arm)   Pulse (!) 55   Temp 98.6 F (37 C) (Oral)   Resp 17   SpO2 100%  Physical Exam Vitals and nursing note reviewed.  Constitutional:      General: She is not in acute distress.    Appearance: She is well-developed.  HENT:     Head: Normocephalic and atraumatic.     Mouth/Throat:     Mouth: Mucous membranes are moist.  Eyes:     General: Vision grossly intact. Gaze aligned appropriately.     Extraocular Movements: Extraocular movements intact.     Conjunctiva/sclera: Conjunctivae normal.  Cardiovascular:     Rate and Rhythm: Normal rate and regular rhythm.     Pulses: Normal pulses.     Heart sounds: Normal heart sounds, S1 normal and S2 normal. No murmur heard.    No friction rub. No gallop.  Pulmonary:     Effort: Pulmonary effort is normal. No respiratory distress.     Breath sounds: Normal breath sounds.  Abdominal:     General: Bowel sounds are normal.     Palpations: Abdomen is soft.     Tenderness: There is generalized abdominal tenderness. There is no guarding or rebound.  Hernia: No hernia is present.  Musculoskeletal:        General: No swelling.     Cervical back: Full passive range of motion without pain, normal range of motion and neck supple. No spinous process tenderness or muscular tenderness. Normal range of motion.     Right lower leg: No edema.     Left lower leg: No edema.  Skin:    General: Skin is warm and dry.     Capillary Refill: Capillary refill takes less than 2 seconds.     Findings: No ecchymosis, erythema, rash or wound.  Neurological:     General: No focal deficit present.     Mental Status: She is alert and oriented to person, place, and time.     GCS: GCS eye subscore is 4. GCS verbal subscore is 5. GCS motor subscore is 6.     Cranial  Nerves: Cranial nerves 2-12 are intact.     Sensory: Sensation is intact.     Motor: Motor function is intact.     Coordination: Coordination is intact.  Psychiatric:        Attention and Perception: Attention normal.        Mood and Affect: Mood normal.        Speech: Speech normal.        Behavior: Behavior normal.     ED Results / Procedures / Treatments   Labs (all labs ordered are listed, but only abnormal results are displayed) Labs Reviewed  LIPASE, BLOOD - Abnormal; Notable for the following components:      Result Value   Lipase 82 (*)    All other components within normal limits  COMPREHENSIVE METABOLIC PANEL - Abnormal; Notable for the following components:   Potassium 3.2 (*)    CO2 15 (*)    Glucose, Bld 258 (*)    Creatinine, Ser 1.26 (*)    AST 74 (*)    GFR, Estimated 57 (*)    Anion gap 20 (*)    All other components within normal limits  CBC - Abnormal; Notable for the following components:   WBC 12.3 (*)    Hemoglobin 9.4 (*)    HCT 31.5 (*)    MCV 72.6 (*)    MCH 21.7 (*)    MCHC 29.8 (*)    RDW 18.8 (*)    Platelets 447 (*)    All other components within normal limits  ETHANOL - Abnormal; Notable for the following components:   Alcohol, Ethyl (B) 134 (*)    All other components within normal limits  CBG MONITORING, ED - Abnormal; Notable for the following components:   Glucose-Capillary 200 (*)    All other components within normal limits  URINALYSIS, ROUTINE W REFLEX MICROSCOPIC  I-STAT BETA HCG BLOOD, ED ([facility], [facility], [facility] ONLY)    EKG None  Radiology CT ABDOMEN PELVIS W CONTRAST  Result Date: 10/28/2022 CLINICAL DATA:  37 year old female with abdominal pain. Persistent vomiting. Crohn disease. EXAM: CT ABDOMEN AND PELVIS WITH CONTRAST TECHNIQUE: Multidetector CT imaging of the abdomen and pelvis was performed using the standard protocol following bolus administration of intravenous contrast. RADIATION DOSE REDUCTION: This exam was performed  according to the departmental dose-optimization program which includes automated exposure control, adjustment of the mA and/or kV according to patient size and/or use of iterative reconstruction technique. CONTRAST:  75mL OMNIPAQUE IOHEXOL 350 MG/ML SOLN COMPARISON:  CT Abdomen and Pelvis 08/22/2022 and earlier. FINDINGS: Lower chest: Negative. Hepatobiliary: Small left hepatic  lobe benign cysts have regressed since 2017 (series 3, image 9, and a similar small right liver dome hypodense area was also present in 2017 (no follow-up imaging recommended). No suspicious liver lesion. Punctate gallstone on series 3, image 29. Gallbladder and bile ducts otherwise negative. Pancreas: Negative. Spleen: Negative. Adrenals/Urinary Tract: Adrenal glands and kidneys appear stable since 2017 and negative. Some mass effect on the urinary bladder from uterine enlargement. Chronic pelvic phleboliths. Stomach/Bowel: Mostly decompressed large and small bowel loops throughout the abdomen and pelvis. Small volume retained stool in the rectum. Evidence of normal appendix series 3, image 59. No convincing active bowel inflammation. No free air or free fluid. Stomach and duodenum also mostly decompressed. Vascular/Lymphatic: Major arterial structures, portal venous system, central venous structures appear patent and normal. No lymphadenopathy identified. Reproductive: Evidence of bilateral dislodged tubal ligation clips, 1 located in the lower greater omentum regions series 3, image 57, the 2nd in the left cul-de-sac. Chronic uterine enlargement and heterogeneity. Underlying chronic fibroid uterus. But a heterogeneously enhancing and lobulated ventral uterine body mass up to 5 point 3 cm diameter (sagittal image 66) was hypoenhancing in February. Ovaries remain within normal limits. Other: No pelvis free fluid. Chronic left cul-de-sac surgical clip, probably dislodged tubal ligation clip which was in the region of the left adnexa on a 2017  CT. Musculoskeletal: Negative. IMPRESSION: 1. Dominant finding is new heterogeneous enhancement of a 5.3 cm ventral myometrial mass which previously appeared as a hypoenhancing fibroid. Other underlying chronic uterine fibroids appear stable. Cannot exclude histologic transformation of the 5.3 cm mass. Recommend follow-up GYN protocol Pelvis MRI without and with contrast for further characterization. Note also bilateral dislodged tubal ligation clips. 2. No other acute or inflammatory process identified in the abdomen or pelvis. Decompressed bowel, with no active inflammatory bowel disease identified. Electronically Signed   By: Odessa Fleming M.D.   On: 10/28/2022 05:21    Procedures Procedures    Medications Ordered in ED Medications  ondansetron (ZOFRAN-ODT) disintegrating tablet 4 mg (4 mg Oral Given 10/28/22 0156)  metoCLOPramide (REGLAN) injection 10 mg (10 mg Intravenous Given 10/28/22 0426)  lactated ringers bolus 1,000 mL (1,000 mLs Intravenous New Bag/Given 10/28/22 0427)    Followed by  lactated ringers bolus 1,000 mL (1,000 mLs Intravenous New Bag/Given 10/28/22 0427)  haloperidol lactate (HALDOL) injection 2 mg (2 mg Intravenous Given 10/28/22 0522)  iohexol (OMNIPAQUE) 350 MG/ML injection 75 mL (75 mLs Intravenous Contrast Given 10/28/22 0507)    ED Course/ Medical Decision Making/ A&P                             Medical Decision Making Amount and/or Complexity of Data Reviewed Labs: ordered. Radiology: ordered.  Risk Prescription drug management.   Presents to the emergency department for evaluation of abdominal pain with nausea, vomiting.  Patient is intoxicated.  This could be causal for her nausea and vomiting.  She does, however, have a history of Crohn's disease.  Lab work was unremarkable other than the alcohol level and a nonspecifically, mildly elevated lipase.  Patient continued to have nausea after initial antiemetics, additional fluids and antiemetics did not resolve her  nausea and vomiting.  CT abdomen and pelvis was performed.  No acute abnormality is noted.  No evidence of Crohn's exacerbation.  No evidence of pancreatic inflammation or pancreatitis.  Will empirically place patient on treatment for gastritis, follow-up with primary care.  Given return precautions.  CT  scan did show abnormality in the uterus.  She will need to follow-up with OB/GYN.        Final Clinical Impression(s) / ED Diagnoses Final diagnoses:  Nausea and vomiting, unspecified vomiting type  Generalized abdominal pain  Alcoholic intoxication without complication  Uterine mass    Rx / DC Orders ED Discharge Orders          Ordered    ondansetron (ZOFRAN-ODT) 4 MG disintegrating tablet        10/28/22 0706    sucralfate (CARAFATE) 1 g tablet  3 times daily with meals & bedtime        10/28/22 0706    pantoprazole (PROTONIX) 40 MG tablet  Daily        10/28/22 0706    hyoscyamine (LEVSIN SL) 0.125 MG SL tablet  Every 4 hours PRN        10/28/22 0706              Gilda Crease, MD 10/28/22 [phone]

## 2022-10-28 NOTE — ED Triage Notes (Signed)
Patient arrived with EMS from home reports persistent emesis this evening and ETOH intake ,  no diarrhea , denies fever or chills .

## 2023-10-08 ENCOUNTER — Other Ambulatory Visit: Payer: Self-pay

## 2023-10-08 ENCOUNTER — Encounter (HOSPITAL_COMMUNITY): Payer: Self-pay | Admitting: Emergency Medicine

## 2023-10-08 ENCOUNTER — Emergency Department (HOSPITAL_COMMUNITY)
Admission: EM | Admit: 2023-10-08 | Discharge: 2023-10-08 | Disposition: A | Discharge status: Home / Self Care | Payer: MEDICAID | Attending: Emergency Medicine | Admitting: Emergency Medicine

## 2023-10-08 DIAGNOSIS — K623 Rectal prolapse: Secondary | ICD-10-CM | POA: Diagnosis not present

## 2023-10-08 DIAGNOSIS — K649 Unspecified hemorrhoids: Secondary | ICD-10-CM | POA: Diagnosis present

## 2023-10-08 LAB — CBC WITH DIFFERENTIAL/PLATELET
Abs Immature Granulocytes: 0.02 10*3/uL (ref 0.00–0.07)
Basophils Absolute: 0 10*3/uL (ref 0.0–0.1)
Basophils Relative: 1 %
Eosinophils Absolute: 0.1 10*3/uL (ref 0.0–0.5)
Eosinophils Relative: 1 %
HCT: 28.3 % — ABNORMAL LOW (ref 36.0–46.0)
Hemoglobin: 8.1 g/dL — ABNORMAL LOW (ref 12.0–15.0)
Immature Granulocytes: 0 %
Lymphocytes Relative: 21 %
Lymphs Abs: 1.4 10*3/uL (ref 0.7–4.0)
MCH: 21.1 pg — ABNORMAL LOW (ref 26.0–34.0)
MCHC: 28.6 g/dL — ABNORMAL LOW (ref 30.0–36.0)
MCV: 73.7 fL — ABNORMAL LOW (ref 80.0–100.0)
Monocytes Absolute: 0.7 10*3/uL (ref 0.1–1.0)
Monocytes Relative: 12 %
Neutro Abs: 4.3 10*3/uL (ref 1.7–7.7)
Neutrophils Relative %: 65 %
Platelets: 355 10*3/uL (ref 150–400)
RBC: 3.84 MIL/uL — ABNORMAL LOW (ref 3.87–5.11)
RDW: 17.7 % — ABNORMAL HIGH (ref 11.5–15.5)
WBC: 6.5 10*3/uL (ref 4.0–10.5)
nRBC: 0 % (ref 0.0–0.2)

## 2023-10-08 LAB — COMPREHENSIVE METABOLIC PANEL WITH GFR
ALT: 14 U/L (ref 0–44)
AST: 22 U/L (ref 15–41)
Albumin: 3.6 g/dL (ref 3.5–5.0)
Alkaline Phosphatase: 71 U/L (ref 38–126)
Anion gap: 10 (ref 5–15)
BUN: 13 mg/dL (ref 6–20)
CO2: 22 mmol/L (ref 22–32)
Calcium: 9.1 mg/dL (ref 8.9–10.3)
Chloride: 105 mmol/L (ref 98–111)
Creatinine, Ser: 0.7 mg/dL (ref 0.44–1.00)
GFR, Estimated: >60 mL/min (ref >60)
Glucose, Bld: 138 mg/dL — ABNORMAL HIGH (ref 70–99)
Potassium: 4 mmol/L (ref 3.5–5.1)
Sodium: 137 mmol/L (ref 135–145)
Total Bilirubin: 0.5 mg/dL (ref 0.0–1.2)
Total Protein: 7.4 g/dL (ref 6.5–8.1)

## 2023-10-08 MED ORDER — MORPHINE SULFATE (PF) 4 MG/ML IV SOLN
4.0000 mg | Freq: Once | INTRAVENOUS | Status: AC
  Administered 2023-10-08: 4 mg via INTRAVENOUS
  Filled 2023-10-08: qty 1

## 2023-10-08 MED ORDER — HYDROCORTISONE (PERIANAL) 2.5 % EX CREA: 1.0000 | TOPICAL_CREAM | Freq: Two times a day (BID) | CUTANEOUS | 0 refills | Status: AC

## 2023-10-08 NOTE — Discharge Instructions (Addendum)
 You were seen in the ER today for concerns of hemorrhoids. You were seen to have a partial rectal prolapse which was physically reduced and pushed back in. As these are prone to possible recurrence, I spoke with her general surgery team who advised that he follow-up with her office outpatient for possible surgical management of this.  I have sent a prescription for a medication called Anusol which is a topical hydrocortisone cream that you will apply twice daily to the rectum.  Try to ensure that you are having as productive bowel movements as you can by ensuring that you are adequately hydrated, consuming enough fiber, and using MiraLAX.  If you feel any sort of constipation, I would suggest avoiding trying to have a bowel movement as this can cause worsening of your current symptoms.  For any concerns of new or worsening symptoms, return to the emergency department.

## 2023-10-08 NOTE — ED Notes (Signed)
 Pt ambulated approximately 500' and re-evaluated. Pt has some rectal prolapse after ambulation. PA notified.

## 2023-10-08 NOTE — ED Provider Notes (Signed)
 [place] EMERG[facility] DEPARTMENT AT [facility] Provider Note   CSN: [id number] Arrival date & time: 10/08/23  0418     History Chief Complaint  Patient presents with   Hemorrhoids    Melanie [name] is a 38 y.o. female.  Patient with past history significant for Crohn's disease presents to the emergency department today with concerns of rectal pain.  She endorses having a bowel movement yesterday and during the bowel movement, experiencing notable pain in the rectum with no bleeding present.  States that she has not had any improvement in pain since then.  HPI     Home Medications Prior to Admission medications   Medication Sig Start Date End Date Taking? Authorizing Provider  hydrocortisone (ANUSOL-HC) 2.5 % rectal cream Place 1 Application rectally 2 (two) times daily. 10/08/23  Yes Smitty Knudsen, PA-C  calcium-vitamin D (OSCAL WITH D) 500-5 MG-MCG tablet Take 2 tablets by mouth 2 (two) times daily. 02/06/22 03/08/22  Skotnicki, Meghan A, DO  escitalopram (LEXAPRO) 20 MG tablet Take 20 mg by mouth daily. 01/14/22   [provider]  ferrous sulfate 325 (65 FE) MG tablet Take 1 tablet (325 mg total) by mouth daily with breakfast. 08/25/22   Regalado, Belkys A, MD  hydrOXYzine (ATARAX) 25 MG tablet Take 25 mg by mouth at bedtime as needed (sleep). 06/26/21   [provider]  hyoscyamine (LEVSIN SL) 0.125 MG SL tablet Place 1 tablet (0.125 mg total) under the tongue every 4 (four) hours as needed for cramping (abdominal pain). 10/28/22   Gilda Crease, MD  levothyroxine (SYNTHROID) 100 MCG tablet Take 1 tablet (100 mcg total) by mouth daily. 02/05/22   Serena Colonel, MD  ondansetron (ZOFRAN-ODT) 4 MG disintegrating tablet 4mg  ODT q4 hours prn nausea/vomit 10/28/22   Pollina, Canary Brim, MD  pantoprazole (PROTONIX) 40 MG tablet Take 1 tablet (40 mg total) by mouth daily. 10/28/22   Gilda Crease, MD  sucralfate (CARAFATE) 1 g tablet Take 1  tablet (1 g total) by mouth 4 (four) times daily -  with meals and at bedtime. 10/28/22   Gilda Crease, MD  Vitamin D, Ergocalciferol, (DRISDOL) 1.25 MG (50000 UNIT) CAPS capsule Take 50,000 Units by mouth once a week. 04/19/20   [provider]      Allergies    Patient has no known allergies.    Review of Systems   Review of Systems  Gastrointestinal:  Positive for rectal pain.  All other systems reviewed and are negative.   Physical Exam Updated Vital Signs BP 130/73 (BP Location: Left Arm)   Pulse 68   Temp 98.5 F (36.9 C) (Oral)   Resp 16   Ht 5\' 4"  (1.626 m)   LMP 09/28/2023 (Exact Date)   SpO2 99%   BMI 23.17 kg/m  Physical Exam Vitals and nursing note reviewed. Exam conducted with a chaperone present.  Constitutional:      General: She is not in acute distress.    Appearance: Normal appearance. She is well-developed.  HENT:     Head: Normocephalic and atraumatic.  Eyes:     Conjunctiva/sclera: Conjunctivae normal.  Cardiovascular:     Rate and Rhythm: Normal rate and regular rhythm.     Heart sounds: No murmur heard. Pulmonary:     Effort: Pulmonary effort is normal. No respiratory distress.     Breath sounds: Normal breath sounds.  Abdominal:     General: Abdomen is flat. Bowel sounds are normal. There  is no distension.     Palpations: Abdomen is soft.     Tenderness: There is no abdominal tenderness. There is no guarding.  Genitourinary:    Exam position: Knee-chest position.     Rectum: Mass and internal hemorrhoid present.     Comments: Partial rectal prolapse with swollen and edematous tissue present, no appreciable hemorrhoids but difficult to assess due to prolapse.  Palpable internal hemorrhoid at the 3 o'clock position. Musculoskeletal:        General: No swelling.     Cervical back: Neck supple.  Skin:    General: Skin is warm and dry.     Capillary Refill: Capillary refill takes less than 2 seconds.  Neurological:      Mental Status: She is alert.  Psychiatric:        Mood and Affect: Mood normal.       ED Results / Procedures / Treatments   Labs (all labs ordered are listed, but only abnormal results are displayed) Labs Reviewed  CBC WITH DIFFERENTIAL/PLATELET - Abnormal; Notable for the following components:      Result Value   RBC 3.84 (*)    Hemoglobin 8.1 (*)    HCT 28.3 (*)    MCV 73.7 (*)    MCH 21.1 (*)    MCHC 28.6 (*)    RDW 17.7 (*)    All other components within normal limits  COMPREHENSIVE METABOLIC PANEL WITH GFR - Abnormal; Notable for the following components:   Glucose, Bld 138 (*)    All other components within normal limits    EKG None  Radiology No results found.  Procedures Procedures    Medications Ordered in ED Medications  morphine (PF) 4 MG/ML injection 4 mg (4 mg Intravenous Given 10/08/23 0749)    ED Course/ Medical Decision Making/ A&P                                 Medical Decision Making Amount and/or Complexity of Data Reviewed Labs: ordered.  Risk Prescription drug management.   This patient presents to the ED for concern of rectal pain. Differential diagnosis includes rectal prolapse, external hemorrhoid, anal fissure, proctitis   Lab Tests:  I Ordered, and personally interpreted labs.  The pertinent results include:  CBC slightly worsened hemoglobin but still close to baseline at 8.1, CMP unremarkable    Medicines ordered and prescription drug management:  I ordered medication including morphine for pain  Reevaluation of the patient after these medicines showed that the patient improved I have reviewed the patients home medicines and have made adjustments as needed   Problem List / ED Course:  Patient was seen emergency department today with concerns of possible hemorrhoid.  Endorsing rectal pain after a bowel movement yesterday.  She has a history of Crohn's.  Not currently treated with any medications.  Denies seeing any  blood on toilet paper or in the toilet and states that this pain is notably worse than prior hemorrhoids.  Clearly has a PCP and GI. Physical exam reveals concerns for partial rectal prolapse. Edematous and erythematous tissue present extending from the anus outward. No bleeding present. Attempted manual reduction at bedside with Dr. Rosalia Hammers but unsuccessful. Will place granulated sugar on prolapse portion of rectum to attempt to reduce swelling and try again shortly after analgesia on board. After allowing granulated sugar in place for 15 minutes, patient had improvement in edema and manual  reduction attempted once again. With relaxation, appears that prolapsed rectum tries to protrude out slightly. Will discuss with general surgery. Spoke with Dr. Luisa Hart, general surgery, who believes that this is more likely be a hemorrhoid process versus partial rectal prolapse.  He advised that since reduction was possible, patient can follow-up with the surgery team outpatient for further assessment.  Suggested using topical Anusol as well as sitz bath's to encourage reduction in inflammation.  I pass along all these recommendations to the patient a prescription for Anusol sent to patient's pharmacy.  Return precautions were discussed and patient otherwise stable at this time with plans for outpatient general surgery follow-up.  Patient discharged home in stable condition.  Final Clinical Impression(s) / ED Diagnoses Final diagnoses:  Partial rectal prolapse  Hemorrhoids, unspecified hemorrhoid type    Rx / DC Orders ED Discharge Orders          Ordered    hydrocortisone (ANUSOL-HC) 2.5 % rectal cream  2 times daily        10/08/23 0841              Smitty Knudsen, PA-C 10/08/23 1610    Margarita Grizzle, MD 10/13/23 1145

## 2023-10-08 NOTE — ED Triage Notes (Signed)
 Pt BIBA from home c/o hemorrhoids, pt endorses pain from rectum that started yesterday after BM. Denies seeing any blood in stool or having BRBPR. Hx of crohn's. VSS

## 2024-02-13 ENCOUNTER — Emergency Department (HOSPITAL_COMMUNITY)
Admission: EM | Admit: 2024-02-13 | Discharge: 2024-02-13 | Disposition: A | Discharge status: Home / Self Care | Payer: MEDICAID | Attending: Emergency Medicine | Admitting: Emergency Medicine

## 2024-02-13 ENCOUNTER — Other Ambulatory Visit: Payer: Self-pay

## 2024-02-13 ENCOUNTER — Encounter (HOSPITAL_COMMUNITY): Payer: Self-pay

## 2024-02-13 ENCOUNTER — Emergency Department (HOSPITAL_COMMUNITY): Payer: MEDICAID

## 2024-02-13 DIAGNOSIS — H209 Unspecified iridocyclitis: Secondary | ICD-10-CM | POA: Insufficient documentation

## 2024-02-13 DIAGNOSIS — Z85850 Personal history of malignant neoplasm of thyroid: Secondary | ICD-10-CM | POA: Diagnosis not present

## 2024-02-13 DIAGNOSIS — H5711 Ocular pain, right eye: Secondary | ICD-10-CM | POA: Diagnosis present

## 2024-02-13 LAB — PREGNANCY, URINE: Preg Test, Ur: NEGATIVE

## 2024-02-13 MED ORDER — OXYCODONE-ACETAMINOPHEN 5-325 MG PO TABS
1.0000 | ORAL_TABLET | Freq: Once | ORAL | Status: AC
  Administered 2024-02-13: 1 via ORAL
  Filled 2024-02-13: qty 1

## 2024-02-13 MED ORDER — FLUORESCEIN SODIUM 1 MG OP STRP
1.0000 | ORAL_STRIP | Freq: Once | OPHTHALMIC | Status: AC
  Administered 2024-02-13: 1 via OPHTHALMIC
  Filled 2024-02-13: qty 1

## 2024-02-13 MED ORDER — PREDNISOLONE ACETATE 1 % OP SUSP
1.0000 [drp] | Freq: Once | OPHTHALMIC | Status: AC
  Administered 2024-02-13: 1 [drp] via OPHTHALMIC
  Filled 2024-02-13: qty 5

## 2024-02-13 MED ORDER — TETRACAINE HCL 0.5 % OP SOLN
2.0000 [drp] | Freq: Once | OPHTHALMIC | Status: AC
  Administered 2024-02-13: 2 [drp] via OPHTHALMIC
  Filled 2024-02-13: qty 4

## 2024-02-13 NOTE — ED Notes (Signed)
 This RN reviewed discharge instructions with patient. She verbalized understanding and denied any further questions. PT well appearing upon discharge and reports no pain. Pt ambulated with stable gait to exit. Pt endorses that she is going straight to eye care.
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
## 2024-02-13 NOTE — Discharge Instructions (Addendum)
 You were seen today for traumatic iritis.  You need to go over to Dr. Waylan at his clinic on [address] [place], [place].  Had right over there as you will not be able to be seen after 430 today.  They have already been made aware that you are coming that way.
